# Patient Record
Sex: Male | Born: 1970
Health system: Southern US, Community
[De-identification: ages and names within clinical notes are randomized; demographics above are authoritative.]

## PROBLEM LIST (undated history)

## (undated) DIAGNOSIS — F419 Anxiety disorder, unspecified: Secondary | ICD-10-CM

## (undated) DIAGNOSIS — I639 Cerebral infarction, unspecified: Secondary | ICD-10-CM

## (undated) DIAGNOSIS — I10 Essential (primary) hypertension: Secondary | ICD-10-CM

## (undated) DIAGNOSIS — E7800 Pure hypercholesterolemia, unspecified: Secondary | ICD-10-CM

## (undated) DIAGNOSIS — M109 Gout, unspecified: Secondary | ICD-10-CM

## (undated) HISTORY — DX: Pure hypercholesterolemia, unspecified: E78.00

## (undated) HISTORY — PX: NO PAST SURGERIES: SHX2092

## (undated) HISTORY — DX: Cerebral infarction, unspecified: I63.9

---

## 1997-09-25 ENCOUNTER — Emergency Department (HOSPITAL_COMMUNITY): Admission: EM | Admit: 1997-09-25 | Discharge: 1997-09-25 | Payer: Self-pay | Admitting: Emergency Medicine

## 2014-10-25 ENCOUNTER — Other Ambulatory Visit: Payer: Self-pay | Admitting: Internal Medicine

## 2014-10-25 DIAGNOSIS — R5383 Other fatigue: Secondary | ICD-10-CM

## 2014-10-25 DIAGNOSIS — R799 Abnormal finding of blood chemistry, unspecified: Secondary | ICD-10-CM

## 2014-10-30 ENCOUNTER — Other Ambulatory Visit: Payer: Self-pay

## 2014-11-05 ENCOUNTER — Ambulatory Visit
Admission: RE | Admit: 2014-11-05 | Discharge: 2014-11-05 | Disposition: A | Discharge status: Home / Self Care | Payer: BLUE CROSS/BLUE SHIELD | Source: Ambulatory Visit | Attending: Internal Medicine | Admitting: Internal Medicine

## 2014-11-05 DIAGNOSIS — R5383 Other fatigue: Secondary | ICD-10-CM

## 2014-11-05 DIAGNOSIS — R799 Abnormal finding of blood chemistry, unspecified: Secondary | ICD-10-CM

## 2015-04-26 ENCOUNTER — Emergency Department (HOSPITAL_COMMUNITY)
Admission: EM | Admit: 2015-04-26 | Discharge: 2015-04-26 | Disposition: A | Discharge status: Home / Self Care | Payer: BLUE CROSS/BLUE SHIELD | Attending: Emergency Medicine | Admitting: Emergency Medicine

## 2015-04-26 ENCOUNTER — Encounter (HOSPITAL_COMMUNITY): Payer: Self-pay | Admitting: *Deleted

## 2015-04-26 DIAGNOSIS — T368X5A Adverse effect of other systemic antibiotics, initial encounter: Secondary | ICD-10-CM | POA: Diagnosis not present

## 2015-04-26 DIAGNOSIS — J392 Other diseases of pharynx: Secondary | ICD-10-CM | POA: Diagnosis not present

## 2015-04-26 DIAGNOSIS — Z79899 Other long term (current) drug therapy: Secondary | ICD-10-CM | POA: Diagnosis not present

## 2015-04-26 DIAGNOSIS — I10 Essential (primary) hypertension: Secondary | ICD-10-CM | POA: Insufficient documentation

## 2015-04-26 DIAGNOSIS — T7840XA Allergy, unspecified, initial encounter: Secondary | ICD-10-CM

## 2015-04-26 DIAGNOSIS — R220 Localized swelling, mass and lump, head: Secondary | ICD-10-CM | POA: Diagnosis not present

## 2015-04-26 DIAGNOSIS — F419 Anxiety disorder, unspecified: Secondary | ICD-10-CM | POA: Diagnosis not present

## 2015-04-26 DIAGNOSIS — L299 Pruritus, unspecified: Secondary | ICD-10-CM | POA: Insufficient documentation

## 2015-04-26 HISTORY — DX: Essential (primary) hypertension: I10

## 2015-04-26 HISTORY — DX: Anxiety disorder, unspecified: F41.9

## 2015-04-26 MED ORDER — SODIUM CHLORIDE 0.9 % IV BOLUS (SEPSIS)
1000.0000 mL | Freq: Once | INTRAVENOUS | Status: AC
  Administered 2015-04-26: 1000 mL via INTRAVENOUS

## 2015-04-26 MED ORDER — FAMOTIDINE IN NACL 20-0.9 MG/50ML-% IV SOLN
20.0000 mg | Freq: Once | INTRAVENOUS | Status: AC
  Administered 2015-04-26: 20 mg via INTRAVENOUS
  Filled 2015-04-26: qty 50

## 2015-04-26 MED ORDER — PREDNISONE 10 MG PO TABS: 40.0000 mg | ORAL_TABLET | Freq: Every day | ORAL | Status: DC

## 2015-04-26 MED ORDER — DIPHENHYDRAMINE HCL 50 MG/ML IJ SOLN
25.0000 mg | Freq: Once | INTRAMUSCULAR | Status: AC
  Administered 2015-04-26: 25 mg via INTRAVENOUS
  Filled 2015-04-26: qty 1

## 2015-04-26 MED ORDER — METHYLPREDNISOLONE SODIUM SUCC 125 MG IJ SOLR
125.0000 mg | Freq: Once | INTRAMUSCULAR | Status: AC
  Administered 2015-04-26: 125 mg via INTRAVENOUS
  Filled 2015-04-26: qty 2

## 2015-04-26 MED ORDER — FAMOTIDINE 20 MG PO TABS: 20.0000 mg | ORAL_TABLET | Freq: Two times a day (BID) | ORAL | Status: DC

## 2015-04-26 NOTE — ED Notes (Signed)
Bed: [facility]3 Expected date:  Expected time:  Means of arrival:  Comments: Allergic rxn

## 2015-04-26 NOTE — ED Notes (Signed)
Patient states that he does feel some tightness in his throat area.  Patient denies any respiratory issues.

## 2015-04-26 NOTE — ED Notes (Signed)
Patient states that he is feeling better but dose continue to have notable swelling around the eyes.

## 2015-04-26 NOTE — Discharge Instructions (Signed)
You are allergic to Levaquin.  Do not take this antibiotic in the future.  Contact your doctor.  Get rechecked immediately if you have new or worsening symptoms.  You can take benadryl, available over the counter as needed for itching.    Drug Allergy Allergic reactions to medicines are common. Some allergic reactions are mild. A delayed type of drug allergy that occurs 1 week or more after exposure to a medicine or vaccine is called serum sickness. A life-threatening, sudden (acute) allergic reaction that involves the whole body is called anaphylaxis. CAUSES  "True" drug allergies occur when there is an allergic reaction to a medicine. This is caused by overactivity of the immune system. First, the body becomes sensitized. The immune system is triggered by your first exposure to the medicine. Following this first exposure, future exposure to the same medicine may be life-threatening. Almost any medicine can cause an allergic reaction. Common ones are:  Penicillin.  Sulfonamides (sulfa drugs).  Local anesthetics.  X-ray dyes that contain iodine. SYMPTOMS  Common symptoms of a minor allergic reaction are:  Swelling around the mouth.  An itchy red rash or hives.  Vomiting or diarrhea. Anaphylaxis can cause swelling of the mouth and throat. This makes it difficult to breathe and swallow. Severe reactions can be fatal within seconds, even after exposure to only a trace amount of the drug that causes the reaction. HOME CARE INSTRUCTIONS  If you are unsure of what caused your reaction, write down:  The names of the medicines you took.  How much medicine you took.  How you took the medicine, such as whether you took a pill, injected the medicine, or applied it to your skin.  All of the things you ate and drank.  The date and time of your reaction.  The symptoms of the reaction.  You may want to follow up with an allergy specialist after the reaction has cleared in order to be tested  to confirm the allergy. It is important to confirm that your reaction is an allergy, not just a side effect to the medicine. If you have a true allergy to a medicine, this may prevent that medicine and related medicines from being given to you when you are very ill.  If you have hives or a rash:  Take medicines as directed by your caregiver.  You may use an over-the-counter antihistamine (diphenhydramine) as needed.  Apply cold compresses to the skin or take baths in cool water. Avoid hot baths or showers.  If you are severely allergic:  Continuous observation after a severe reaction may be needed. Hospitalization is often required.  Wear a medical alert bracelet or necklace stating your allergy.  You and your family must learn how to use an anaphylaxis kit or give an epinephrine injection to temporarily treat an emergency allergic reaction. If you have had a severe reaction, always carry your epinephrine injection or anaphylaxis kit with you. This can be lifesaving if you have a severe reaction.  Do not drive or perform tasks after treatment until the medicines used to treat your reaction have worn off, or until your caregiver says it is okay.  If you have a drug allergy that was confirmed by your health care provider:  Carry information about the drug allergy with you at all times.  Always check with a pharmacist before taking any over-the-counter medicine. SEEK MEDICAL CARE IF:   You think you had an allergic reaction. Symptoms usually start within 30 minutes after exposure.  Symptoms are getting worse rather than better.  You develop new symptoms.  The symptoms that brought you to your caregiver return. SEEK IMMEDIATE MEDICAL CARE IF:   You have swelling of the mouth, difficulty breathing, or wheezing.  You have a tight feeling in your chest or throat.  You develop hives, swelling, or itching all over your body.  You develop severe vomiting or diarrhea.  You feel faint  or pass out. This is an emergency. Use your epinephrine injection or anaphylaxis kit as you have been instructed. Call for emergency medical help. Even if you improve after the injection, you need to be examined at a hospital emergency department. MAKE SURE YOU:   Understand these instructions.  Will watch your condition.  Will get help right away if you are not doing well or get worse.   This information is not intended to replace advice given to you by your health care provider. Make sure you discuss any questions you have with your health care provider.   Document Released: 03/02/2005 Document Revised: 03/23/2014 Document Reviewed: 10/02/2014 Elsevier Interactive Patient Education [employer].

## 2015-04-26 NOTE — ED Notes (Addendum)
Pt reports allergic reaction to levaquin. Has taken it before without issue. Took med PTA and began having eye itching and swelling within 5 min. Feels like it is hard to swallow and like his throat is closing. 100% O2 on RA. Has never had an allergic reaction. Pt very anxious. Noticeable swelling to both eyes. Denies itching now. Sts face just feels "tight."

## 2015-04-26 NOTE — ED Provider Notes (Signed)
CSN: [id number]     Arrival date & time 04/26/15  1128 History   First MD Initiated Contact with Patient 04/26/15 1201     Chief Complaint  Patient presents with  . Allergic Reaction     Patient is a 45 y.o. male presenting with allergic reaction. The history is provided by the patient. No language interpreter was used.  Allergic Reaction  Jack [name] is a 45 y.o. male who presents to the Emergency Department complaining of allergic reaction.  Jack [name] was feeling ill with a cold for the last week with chest congestion and nasal congestion. He was treated with a Z-Pak by urgent care Symptoms and then his symptoms began to recur so today he took a Levaquin that he had at home that expired 2-3 years ago. He took it about one hour prior to ED arrival. Shortly after taking the medication he felt swelling and itching to his face and tightness in his throat and is throat felt like it was closing. He had associated shortness of breath. He drove down to the emergency department from [place]. He states that it feels like his throat is improving and does not feel like it's closing anymore. His eyes continue to feel swollen. His itching has improved. He denies any rash, fever, abdominal pain, nausea, vomiting.  Past Medical History  Diagnosis Date  . Hypertension   . Anxiety    History reviewed. No pertinent past surgical history. No family history on file. Social History  Substance Use Topics  . Smoking status: Never Smoker   . Smokeless tobacco: None  . Alcohol Use: No    Review of Systems  All other systems reviewed and are negative.     Allergies  Levaquin  Home Medications   Prior to Admission medications   Medication Sig Start Date End Date Taking? Authorizing Provider  allopurinol (ZYLOPRIM) 100 MG tablet Take 200 mg by mouth daily. 04/06/15  Yes Historical Provider, MD  cloNIDine (CATAPRES) 0.1 MG tablet Take 0.1 mg by mouth 2 (two) times daily. 03/14/15  Yes  Historical Provider, MD  COLCRYS 0.6 MG tablet Take 0.6 mg by mouth 2 (two) times daily. 04/08/15  Yes Historical Provider, MD  ibuprofen (ADVIL,MOTRIN) 200 MG tablet Take 400 mg by mouth every 6 (six) hours as needed for headache, mild pain or moderate pain.   Yes Historical Provider, MD  levofloxacin (LEVAQUIN) 500 MG tablet Take 500 mg by mouth once.   Yes Historical Provider, MD  famotidine (PEPCID) 20 MG tablet Take 1 tablet (20 mg total) by mouth 2 (two) times daily. 04/26/15   [name], MD  predniSONE (DELTASONE) 10 MG tablet Take 4 tablets (40 mg total) by mouth daily. 04/26/15   [name], MD   BP 156/107 mmHg  Pulse 83  Temp(Src) 97.4 F (36.3 C) (Oral)  Resp 18  SpO2 97% Physical Exam  Constitutional: He is oriented to person, place, and time. He appears well-developed and well-nourished.  HENT:  Head: Normocephalic and atraumatic.  Moderate periorbital edema bilaterally. No significant oropharyngeal swelling  Cardiovascular: Normal rate and regular rhythm.   No murmur heard. Pulmonary/Chest: Effort normal and breath sounds normal. No stridor. No respiratory distress.  Abdominal: Soft. There is no tenderness. There is no rebound and no guarding.  Musculoskeletal: He exhibits no edema or tenderness.  Neurological: He is alert and oriented to person, place, and time.  Skin: Skin is warm and dry.  Psychiatric: He has a normal mood and affect.  His behavior is normal.  Nursing note and vitals reviewed.   ED Course  Procedures (including critical care time) Labs Review Labs Reviewed - No data to display  Imaging Review No results found. I have personally reviewed and evaluated these images and lab results as part of my medical decision-making.   EKG Interpretation None      MDM   Final diagnoses:  Allergic reaction, initial encounter    Patient here for evaluation of facial and throat swelling following taking an antibiotic. Patient with considerable  facial swelling on examination but no respiratory distress. He did have throat swelling prior to arrival but he states this is resolving. Treated with steroids, Pepcid, Benadryl. Patient observed in the emergency department with no recurrent symptoms. His facial swelling has improved on repeat evaluation and he states he is asymptomatic this time. Discussed with patient's home care for allergic reaction with Benadryl over-the-counter. Provided prescriptions for steroids and Pepcid. Discussed close return precautions. In terms of his recent URI that he said is take Levaquin for her, do not feel this is indicated and he has no evidence of acute infectious process at this time.    [name], MD 04/26/15 [phone]

## 2015-04-26 NOTE — ED Notes (Signed)
Patient is alert and oriented x3.  He was given DC instructions and follow up visit instructions.  Patient gave verbal understanding.  He was DC ambulatory under his own power to home.  V/S stable.  He was not showing any signs of distress on DC 

## 2015-06-18 DIAGNOSIS — I10 Essential (primary) hypertension: Secondary | ICD-10-CM | POA: Diagnosis not present

## 2015-07-25 DIAGNOSIS — M2550 Pain in unspecified joint: Secondary | ICD-10-CM | POA: Diagnosis not present

## 2015-07-25 DIAGNOSIS — R7989 Other specified abnormal findings of blood chemistry: Secondary | ICD-10-CM | POA: Diagnosis not present

## 2015-07-25 DIAGNOSIS — M1009 Idiopathic gout, multiple sites: Secondary | ICD-10-CM | POA: Diagnosis not present

## 2015-07-25 DIAGNOSIS — K760 Fatty (change of) liver, not elsewhere classified: Secondary | ICD-10-CM | POA: Diagnosis not present

## 2015-08-28 DIAGNOSIS — Z79899 Other long term (current) drug therapy: Secondary | ICD-10-CM | POA: Diagnosis not present

## 2015-08-28 DIAGNOSIS — M1009 Idiopathic gout, multiple sites: Secondary | ICD-10-CM | POA: Diagnosis not present

## 2016-01-24 DIAGNOSIS — M1009 Idiopathic gout, multiple sites: Secondary | ICD-10-CM | POA: Diagnosis not present

## 2016-01-24 DIAGNOSIS — Z79899 Other long term (current) drug therapy: Secondary | ICD-10-CM | POA: Diagnosis not present

## 2016-01-24 DIAGNOSIS — M2550 Pain in unspecified joint: Secondary | ICD-10-CM | POA: Diagnosis not present

## 2016-07-02 ENCOUNTER — Encounter (HOSPITAL_COMMUNITY): Payer: BLUE CROSS/BLUE SHIELD

## 2016-07-02 ENCOUNTER — Emergency Department (HOSPITAL_COMMUNITY): Payer: BLUE CROSS/BLUE SHIELD

## 2016-07-02 ENCOUNTER — Encounter (HOSPITAL_COMMUNITY): Payer: Self-pay | Admitting: *Deleted

## 2016-07-02 ENCOUNTER — Inpatient Hospital Stay (HOSPITAL_COMMUNITY)
Admission: EM | Admit: 2016-07-02 | Discharge: 2016-07-04 | DRG: 062 | Disposition: A | Discharge status: Home Health | Payer: BLUE CROSS/BLUE SHIELD | Attending: Neurology | Admitting: Neurology

## 2016-07-02 ENCOUNTER — Observation Stay (HOSPITAL_COMMUNITY): Payer: BLUE CROSS/BLUE SHIELD

## 2016-07-02 ENCOUNTER — Inpatient Hospital Stay (HOSPITAL_COMMUNITY): Payer: BLUE CROSS/BLUE SHIELD

## 2016-07-02 DIAGNOSIS — R29706 NIHSS score 6: Secondary | ICD-10-CM | POA: Diagnosis not present

## 2016-07-02 DIAGNOSIS — M1A9XX0 Chronic gout, unspecified, without tophus (tophi): Secondary | ICD-10-CM | POA: Diagnosis not present

## 2016-07-02 DIAGNOSIS — R29703 NIHSS score 3: Secondary | ICD-10-CM | POA: Diagnosis present

## 2016-07-02 DIAGNOSIS — R7989 Other specified abnormal findings of blood chemistry: Secondary | ICD-10-CM | POA: Diagnosis present

## 2016-07-02 DIAGNOSIS — E785 Hyperlipidemia, unspecified: Secondary | ICD-10-CM | POA: Diagnosis present

## 2016-07-02 DIAGNOSIS — G8191 Hemiplegia, unspecified affecting right dominant side: Secondary | ICD-10-CM | POA: Diagnosis present

## 2016-07-02 DIAGNOSIS — Z791 Long term (current) use of non-steroidal anti-inflammatories (NSAID): Secondary | ICD-10-CM | POA: Diagnosis not present

## 2016-07-02 DIAGNOSIS — R4781 Slurred speech: Secondary | ICD-10-CM | POA: Diagnosis present

## 2016-07-02 DIAGNOSIS — E663 Overweight: Secondary | ICD-10-CM | POA: Diagnosis not present

## 2016-07-02 DIAGNOSIS — J984 Other disorders of lung: Secondary | ICD-10-CM | POA: Diagnosis not present

## 2016-07-02 DIAGNOSIS — R471 Dysarthria and anarthria: Secondary | ICD-10-CM | POA: Diagnosis present

## 2016-07-02 DIAGNOSIS — Z6829 Body mass index (BMI) 29.0-29.9, adult: Secondary | ICD-10-CM | POA: Diagnosis not present

## 2016-07-02 DIAGNOSIS — I161 Hypertensive emergency: Secondary | ICD-10-CM | POA: Diagnosis not present

## 2016-07-02 DIAGNOSIS — M109 Gout, unspecified: Secondary | ICD-10-CM | POA: Diagnosis present

## 2016-07-02 DIAGNOSIS — G458 Other transient cerebral ischemic attacks and related syndromes: Secondary | ICD-10-CM | POA: Diagnosis not present

## 2016-07-02 DIAGNOSIS — Z79899 Other long term (current) drug therapy: Secondary | ICD-10-CM | POA: Diagnosis not present

## 2016-07-02 DIAGNOSIS — F4001 Agoraphobia with panic disorder: Secondary | ICD-10-CM

## 2016-07-02 DIAGNOSIS — F419 Anxiety disorder, unspecified: Secondary | ICD-10-CM

## 2016-07-02 DIAGNOSIS — R000 Tachycardia, unspecified: Secondary | ICD-10-CM | POA: Diagnosis not present

## 2016-07-02 DIAGNOSIS — R269 Unspecified abnormalities of gait and mobility: Secondary | ICD-10-CM | POA: Diagnosis not present

## 2016-07-02 DIAGNOSIS — G459 Transient cerebral ischemic attack, unspecified: Secondary | ICD-10-CM | POA: Diagnosis not present

## 2016-07-02 DIAGNOSIS — I10 Essential (primary) hypertension: Secondary | ICD-10-CM | POA: Diagnosis not present

## 2016-07-02 DIAGNOSIS — H538 Other visual disturbances: Secondary | ICD-10-CM | POA: Diagnosis not present

## 2016-07-02 DIAGNOSIS — I63512 Cerebral infarction due to unspecified occlusion or stenosis of left middle cerebral artery: Secondary | ICD-10-CM | POA: Diagnosis not present

## 2016-07-02 DIAGNOSIS — R21 Rash and other nonspecific skin eruption: Secondary | ICD-10-CM | POA: Diagnosis not present

## 2016-07-02 DIAGNOSIS — R29810 Facial weakness: Secondary | ICD-10-CM

## 2016-07-02 DIAGNOSIS — D72829 Elevated white blood cell count, unspecified: Secondary | ICD-10-CM | POA: Diagnosis not present

## 2016-07-02 DIAGNOSIS — I160 Hypertensive urgency: Secondary | ICD-10-CM | POA: Diagnosis not present

## 2016-07-02 DIAGNOSIS — R531 Weakness: Secondary | ICD-10-CM | POA: Diagnosis not present

## 2016-07-02 DIAGNOSIS — R29704 NIHSS score 4: Secondary | ICD-10-CM | POA: Diagnosis not present

## 2016-07-02 DIAGNOSIS — I63312 Cerebral infarction due to thrombosis of left middle cerebral artery: Secondary | ICD-10-CM | POA: Diagnosis not present

## 2016-07-02 DIAGNOSIS — J9811 Atelectasis: Secondary | ICD-10-CM | POA: Diagnosis not present

## 2016-07-02 DIAGNOSIS — J439 Emphysema, unspecified: Secondary | ICD-10-CM | POA: Diagnosis present

## 2016-07-02 DIAGNOSIS — Z881 Allergy status to other antibiotic agents status: Secondary | ICD-10-CM | POA: Diagnosis not present

## 2016-07-02 DIAGNOSIS — I639 Cerebral infarction, unspecified: Secondary | ICD-10-CM | POA: Diagnosis not present

## 2016-07-02 DIAGNOSIS — R404 Transient alteration of awareness: Secondary | ICD-10-CM | POA: Diagnosis not present

## 2016-07-02 DIAGNOSIS — R51 Headache: Secondary | ICD-10-CM | POA: Diagnosis not present

## 2016-07-02 HISTORY — DX: Gout, unspecified: M10.9

## 2016-07-02 LAB — COMPREHENSIVE METABOLIC PANEL
ALBUMIN: 4.1 g/dL (ref 3.5–5.0)
ALK PHOS: 66 U/L (ref 38–126)
ALT: 95 U/L — ABNORMAL HIGH (ref 17–63)
ALT: 102 U/L — AB (ref 17–63)
ANION GAP: 14 (ref 5–15)
AST: 70 U/L — ABNORMAL HIGH (ref 15–41)
AST: 70 U/L — AB (ref 15–41)
Albumin: 4.3 g/dL (ref 3.5–5.0)
Alkaline Phosphatase: 67 U/L (ref 38–126)
Anion gap: 11 (ref 5–15)
BILIRUBIN TOTAL: 0.6 mg/dL (ref 0.3–1.2)
BILIRUBIN TOTAL: 1 mg/dL (ref 0.3–1.2)
BUN: 17 mg/dL (ref 6–20)
BUN: 17 mg/dL (ref 6–20)
CALCIUM: 9.7 mg/dL (ref 8.9–10.3)
CO2: 21 mmol/L — AB (ref 22–32)
CO2: 25 mmol/L (ref 22–32)
CREATININE: 1.03 mg/dL (ref 0.61–1.24)
Calcium: 9.6 mg/dL (ref 8.9–10.3)
Chloride: 103 mmol/L (ref 101–111)
Chloride: 103 mmol/L (ref 101–111)
Creatinine, Ser: 1.2 mg/dL (ref 0.61–1.24)
GFR calc Af Amer: >60 mL/min (ref >60)
GFR calc Af Amer: >60 mL/min (ref >60)
GFR calc non Af Amer: >60 mL/min (ref >60)
GFR calc non Af Amer: >60 mL/min (ref >60)
GLUCOSE: 122 mg/dL — AB (ref 65–99)
Glucose, Bld: 158 mg/dL — ABNORMAL HIGH (ref 65–99)
POTASSIUM: 3.9 mmol/L (ref 3.5–5.1)
POTASSIUM: 3.8 mmol/L (ref 3.5–5.1)
SODIUM: 138 mmol/L (ref 135–145)
SODIUM: 139 mmol/L (ref 135–145)
Total Protein: 7 g/dL (ref 6.5–8.1)
Total Protein: 7.5 g/dL (ref 6.5–8.1)

## 2016-07-02 LAB — PROTIME-INR
INR: 0.95
Prothrombin Time: 12.7 seconds (ref 11.4–15.2)

## 2016-07-02 LAB — APTT: APTT: 30 s (ref 24–36)

## 2016-07-02 LAB — I-STAT CHEM 8, ED
BUN: 19 mg/dL (ref 6–20)
CHLORIDE: 106 mmol/L (ref 101–111)
CREATININE: 1.1 mg/dL (ref 0.61–1.24)
Calcium, Ion: 1.02 mmol/L — ABNORMAL LOW (ref 1.15–1.40)
Glucose, Bld: 124 mg/dL — ABNORMAL HIGH (ref 65–99)
HEMATOCRIT: 44 % (ref 39.0–52.0)
Hemoglobin: 15 g/dL (ref 13.0–17.0)
POTASSIUM: 3.7 mmol/L (ref 3.5–5.1)
SODIUM: 139 mmol/L (ref 135–145)
TCO2: 23 mmol/L (ref 0–100)

## 2016-07-02 LAB — CBC
HCT: 43.8 % (ref 39.0–52.0)
HCT: 47.1 % (ref 39.0–52.0)
HEMOGLOBIN: 15.3 g/dL (ref 13.0–17.0)
HEMOGLOBIN: 16.2 g/dL (ref 13.0–17.0)
MCH: 31 pg (ref 26.0–34.0)
MCH: 30.6 pg (ref 26.0–34.0)
MCHC: 34.9 g/dL (ref 30.0–36.0)
MCHC: 34.4 g/dL (ref 30.0–36.0)
MCV: 88.7 fL (ref 78.0–100.0)
MCV: 88.9 fL (ref 78.0–100.0)
PLATELETS: 231 10*3/uL (ref 150–400)
PLATELETS: 177 10*3/uL (ref 150–400)
RBC: 4.94 MIL/uL (ref 4.22–5.81)
RBC: 5.3 MIL/uL (ref 4.22–5.81)
RDW: 13.2 % (ref 11.5–15.5)
RDW: 13.3 % (ref 11.5–15.5)
WBC: 8.6 10*3/uL (ref 4.0–10.5)
WBC: 11.3 10*3/uL — AB (ref 4.0–10.5)

## 2016-07-02 LAB — MRSA PCR SCREENING: MRSA BY PCR: NEGATIVE

## 2016-07-02 LAB — RAPID URINE DRUG SCREEN, HOSP PERFORMED
AMPHETAMINES: NOT DETECTED
BARBITURATES: NOT DETECTED
Benzodiazepines: NOT DETECTED
Cocaine: NOT DETECTED
Opiates: NOT DETECTED
Tetrahydrocannabinol: NOT DETECTED

## 2016-07-02 LAB — URINALYSIS, ROUTINE W REFLEX MICROSCOPIC
BILIRUBIN URINE: NEGATIVE
Glucose, UA: NEGATIVE mg/dL
HGB URINE DIPSTICK: NEGATIVE
Ketones, ur: NEGATIVE mg/dL
Leukocytes, UA: NEGATIVE
Nitrite: NEGATIVE
PROTEIN: NEGATIVE mg/dL
SPECIFIC GRAVITY, URINE: 1.004 — AB (ref 1.005–1.030)
pH: 7 (ref 5.0–8.0)

## 2016-07-02 LAB — HIV ANTIBODY (ROUTINE TESTING W REFLEX): HIV Screen 4th Generation wRfx: NONREACTIVE

## 2016-07-02 LAB — GLUCOSE, CAPILLARY
Glucose-Capillary: 105 mg/dL — ABNORMAL HIGH (ref 65–99)
Glucose-Capillary: 106 mg/dL — ABNORMAL HIGH (ref 65–99)

## 2016-07-02 LAB — ECHOCARDIOGRAM COMPLETE
Height: 72 in
Weight: 3452.8 oz

## 2016-07-02 LAB — DIFFERENTIAL
BASOS ABS: 0 10*3/uL (ref 0.0–0.1)
Basophils Relative: 0 %
EOS PCT: 3 %
Eosinophils Absolute: 0.3 10*3/uL (ref 0.0–0.7)
LYMPHS ABS: 4.1 10*3/uL — AB (ref 0.7–4.0)
LYMPHS PCT: 48 %
Monocytes Absolute: 0.7 10*3/uL (ref 0.1–1.0)
Monocytes Relative: 8 %
NEUTROS PCT: 41 %
Neutro Abs: 3.5 10*3/uL (ref 1.7–7.7)

## 2016-07-02 LAB — LIPID PANEL
CHOLESTEROL: 317 mg/dL — AB (ref 0–200)
HDL: 34 mg/dL — ABNORMAL LOW (ref >40)
LDL Cholesterol: 239 mg/dL — ABNORMAL HIGH (ref 0–99)
TRIGLYCERIDES: 219 mg/dL — AB (ref <150)
Total CHOL/HDL Ratio: 9.3 RATIO
VLDL: 44 mg/dL — AB (ref 0–40)

## 2016-07-02 LAB — I-STAT TROPONIN, ED: TROPONIN I, POC: 0 ng/mL (ref 0.00–0.08)

## 2016-07-02 LAB — CBG MONITORING, ED
GLUCOSE-CAPILLARY: 123 mg/dL — AB (ref 65–99)
Glucose-Capillary: 102 mg/dL — ABNORMAL HIGH (ref 65–99)

## 2016-07-02 LAB — ETHANOL: Alcohol, Ethyl (B): <5 mg/dL (ref <5)

## 2016-07-02 MED ORDER — SENNOSIDES-DOCUSATE SODIUM 8.6-50 MG PO TABS: 1.0000 | ORAL_TABLET | Freq: Every evening | ORAL | Status: DC | PRN

## 2016-07-02 MED ORDER — NICARDIPINE HCL IN NACL 20-0.86 MG/200ML-% IV SOLN
0.0000 mg/h | INTRAVENOUS | Status: DC
  Administered 2016-07-02 (×2): 2.5 mg/h via INTRAVENOUS
  Administered 2016-07-02: 5 mg/h via INTRAVENOUS
  Administered 2016-07-03: 3 mg/h via INTRAVENOUS
  Filled 2016-07-02 (×4): qty 200

## 2016-07-02 MED ORDER — SODIUM CHLORIDE 0.9 % IV SOLN: INTRAVENOUS | Status: AC

## 2016-07-02 MED ORDER — SODIUM CHLORIDE 0.9 % IV SOLN
INTRAVENOUS | Status: DC
  Administered 2016-07-02 – 2016-07-03 (×4): via INTRAVENOUS

## 2016-07-02 MED ORDER — STROKE: EARLY STAGES OF RECOVERY BOOK
Freq: Once | Status: AC
  Filled 2016-07-02: qty 1

## 2016-07-02 MED ORDER — STROKE: EARLY STAGES OF RECOVERY BOOK
Freq: Once | Status: AC
  Administered 2016-07-02: 06:00:00
  Filled 2016-07-02: qty 1

## 2016-07-02 MED ORDER — ACETAMINOPHEN 650 MG RE SUPP: 650.0000 mg | RECTAL | Status: DC | PRN

## 2016-07-02 MED ORDER — SODIUM CHLORIDE 0.9 % IV SOLN: 50.0000 mL | Freq: Once | INTRAVENOUS | Status: DC

## 2016-07-02 MED ORDER — PANTOPRAZOLE SODIUM 40 MG IV SOLR
40.0000 mg | Freq: Every day | INTRAVENOUS | Status: DC
  Administered 2016-07-02 – 2016-07-03 (×2): 40 mg via INTRAVENOUS
  Filled 2016-07-02 (×2): qty 40

## 2016-07-02 MED ORDER — IOPAMIDOL (ISOVUE-370) INJECTION 76%
INTRAVENOUS | Status: AC
  Administered 2016-07-02: 50 mL
  Filled 2016-07-02: qty 50

## 2016-07-02 MED ORDER — ACETAMINOPHEN 325 MG PO TABS: 650.0000 mg | ORAL_TABLET | ORAL | Status: DC | PRN

## 2016-07-02 MED ORDER — ACETAMINOPHEN 160 MG/5ML PO SOLN: 650.0000 mg | ORAL | Status: DC | PRN

## 2016-07-02 MED ORDER — NICARDIPINE HCL IN NACL 20-0.86 MG/200ML-% IV SOLN: 5.0000 mg/h | Freq: Once | INTRAVENOUS | Status: DC

## 2016-07-02 MED ORDER — ENOXAPARIN SODIUM 40 MG/0.4ML ~~LOC~~ SOLN
40.0000 mg | SUBCUTANEOUS | Status: DC
  Administered 2016-07-03 – 2016-07-04 (×2): 40 mg via SUBCUTANEOUS
  Filled 2016-07-02 (×2): qty 0.4

## 2016-07-02 MED ORDER — ALTEPLASE (STROKE) FULL DOSE INFUSION
90.0000 mg | Freq: Once | INTRAVENOUS | Status: AC
  Administered 2016-07-02: 90 mg via INTRAVENOUS
  Filled 2016-07-02: qty 100

## 2016-07-02 NOTE — Code Documentation (Signed)
RRT responded to Code stroke called at 0422.  LSN 0400.  Pt deemed candidate for TPA by Dr. Roseanne Reno. Pharmacist notified.  Additional IV access obtained.  Foley attempted without success.  Cardene gtt started prior to tPA admin.  CTA completed.  Assisted with pt transfer to 4N31 for further care.

## 2016-07-02 NOTE — ED Triage Notes (Signed)
The pt was also c/o weakness 13 hours ago

## 2016-07-02 NOTE — Care Management Note (Signed)
Case Management Note  Patient Details  Name: Jack [name] MRN: [id number] Date of Birth: 08-13-1970  Subjective/Objective:  Pt admitted on 07/02/16 s/p stroke s/s, s/p TPA.  PTA, pt independent of ADLS; has supportive mother and significant other.                  Action/Plan: PT/OT evaluations pending.  Will follow for discharge planning as pt progresses.    Expected Discharge Date:                  Expected Discharge Plan:  IP Rehab Facility  In-House Referral:     Discharge planning Services  CM Consult  Post Acute Care Choice:    Choice offered to:     DME Arranged:    DME Agency:     HH Arranged:    HH Agency:     Status of Service:  In process, will continue to follow  If discussed at Long Length of Stay Meetings, dates discussed:    Additional Comments:  Quintella Baton, RN, BSN  Trauma/Neuro ICU Case Manager [phone]

## 2016-07-02 NOTE — ED Notes (Signed)
Gunnar Fusi called this RN to reactivate Code Stroke.  Neuro to reassess.

## 2016-07-02 NOTE — ED Provider Notes (Signed)
[facility]EM[facility]GENCY DEPT Provider Note   CSN: [id number] Arrival date & time: 07/02/16  4098   By signing my name below, I, Jack [name], attest that this documentation has been prepared under the direction and in the presence of Jack Crumble, MD  Electronically Signed: Clovis [name], ED Scribe. 07/02/16. 12:55 AM.  History   Chief Complaint Chief Complaint  Patient presents with  . Headache    HPI Comments:  Jack [name] is a 46 y.o. male, with a PMHx of HTN, who presents to the Emergency Department complaining of acute onset, waxing and waning right sided weakness which began 2 hours PTA. Pt also reports a gradually improving speech difficulty. Pt states he went to be evaluated at [facility] a few hours ago but notes he went home since his symptoms subsided. He notes his symptoms returned 1 hour PTA. EMS personnel reports the pt began to experience generalized weakness and blurry vision 14 hours PTA. EMS states the pt had slurred speech, right facial droop, right arm drift and a gait issue x 2 hours. The pt was given 20 mg of Labetalol en route to the ED. The pt's blood pressure went from 252/140 to 180/120 PTA. No other complaints noted at this time.   The history is provided by the patient and the EMS personnel. No language interpreter was used.    Past Medical History:  Diagnosis Date  . Anxiety   . Hypertension     There are no active problems to display for this patient.   History reviewed. No pertinent surgical history.     Home Medications    Prior to Admission medications   Medication Sig Start Date End Date Taking? Authorizing Provider  cloNIDine (CATAPRES) 0.1 MG tablet Take 0.1 mg by mouth 2 (two) times daily. 03/14/15  Yes Historical Provider, MD  ibuprofen (ADVIL,MOTRIN) 200 MG tablet Take 400 mg by mouth every 6 (six) hours as needed for headache, mild pain or moderate pain.   Yes Historical Provider, MD  famotidine (PEPCID) 20 MG tablet Take 1 tablet  (20 mg total) by mouth 2 (two) times daily. Patient not taking: Reported on 07/02/2016 04/26/15   [name], MD  predniSONE (DELTASONE) 10 MG tablet Take 4 tablets (40 mg total) by mouth daily. Patient not taking: Reported on 07/02/2016 04/26/15   [name], MD    Family History No family history on file.  Social History Social History  Substance Use Topics  . Smoking status: Never Smoker  . Smokeless tobacco: Never Used  . Alcohol use No     Allergies   Levaquin [levofloxacin in d5w]   Review of Systems Review of Systems All other systems reviewed and are negative for acute change except as noted in the HPI.  Physical Exam Updated Vital Signs BP (!) 158/111   Pulse 82   Temp 98.6 F (37 C) (Oral)   Resp (!) 21   Ht 6' (1.829 m)   Wt 220 lb (99.8 kg)   SpO2 100%   BMI 29.84 kg/m   Physical Exam  Constitutional: He is oriented to person, place, and time. Vital signs are normal. He appears well-developed and well-nourished.  Non-toxic appearance. He does not appear ill. No distress.  HENT:  Head: Normocephalic and atraumatic.  Nose: Nose normal.  Mouth/Throat: Oropharynx is clear and moist. No oropharyngeal exudate.  Eyes: Conjunctivae and EOM are normal. Pupils are equal, round, and reactive to light. No scleral icterus.  Neck: Normal range of motion.  Neck supple. No tracheal deviation, no edema, no erythema and normal range of motion present. No thyroid mass and no thyromegaly present.  Cardiovascular: Normal rate, regular rhythm, S1 normal, S2 normal, normal heart sounds, intact distal pulses and normal pulses.  Exam reveals no gallop and no friction rub.   No murmur heard. Pulmonary/Chest: Effort normal and breath sounds normal. No respiratory distress. He has no wheezes. He has no rhonchi. He has no rales.  Abdominal: Soft. Normal appearance and bowel sounds are normal. He exhibits no distension, no ascites and no mass. There is no hepatosplenomegaly. There  is no tenderness. There is no rebound, no guarding and no CVA tenderness.  Musculoskeletal: Normal range of motion. He exhibits no edema or tenderness.  Lymphadenopathy:    He has no cervical adenopathy.  Neurological: He is alert and oriented to person, place, and time. He has normal strength. No cranial nerve deficit or sensory deficit.  3/5 strength in right upper and lower extremity with right sided pronator drift.  Skin: Skin is warm, dry and intact. No petechiae and no rash noted. He is not diaphoretic. No erythema. No pallor.  Nursing note and vitals reviewed.    ED Treatments / Results  DIAGNOSTIC STUDIES:  Oxygen Saturation is 96% on RA, normal by my interpretation.    COORDINATION OF CARE:  12:49 AM Discussed treatment plan with pt at bedside and pt agreed to plan.  Labs (all labs ordered are listed, but only abnormal results are displayed) Labs Reviewed  DIFFERENTIAL - Abnormal; Notable for the following:       Result Value   Lymphs Abs 4.1 (*)    All other components within normal limits  COMPREHENSIVE METABOLIC PANEL - Abnormal; Notable for the following:    CO2 21 (*)    Glucose, Bld 122 (*)    AST 70 (*)    ALT 95 (*)    All other components within normal limits  I-STAT CHEM 8, ED - Abnormal; Notable for the following:    Glucose, Bld 124 (*)    Calcium, Ion 1.02 (*)    All other components within normal limits  CBG MONITORING, ED - Abnormal; Notable for the following:    Glucose-Capillary 123 (*)    All other components within normal limits  ETHANOL  PROTIME-INR  APTT  CBC  RAPID URINE DRUG SCREEN, HOSP PERFORMED  URINALYSIS, ROUTINE W REFLEX MICROSCOPIC  I-STAT TROPOININ, ED    EKG  EKG Interpretation  Date/Time:  Thursday July 02 2016 00:48:51 EDT Ventricular Rate:  83 PR Interval:    QRS Duration: 86 QT Interval:  385 QTC Calculation: 453 R Axis:   21 Text Interpretation:  Normal sinus rhythm No old tracing to compare Confirmed by Erroll Luna [phone]) on 07/02/2016 12:54:43 AM       Radiology Ct Head Code Stroke W/o Cm  Result Date: 07/02/2016 CLINICAL DATA:  Code stroke. Initial evaluation for acute right-sided weakness. EXAM: CT HEAD WITHOUT CONTRAST TECHNIQUE: Contiguous axial images were obtained from the base of the skull through the vertex without intravenous contrast. COMPARISON:  None. FINDINGS: Brain: Cerebral volume normal. No acute intracranial hemorrhage. No evidence for acute large vessel territory infarct. No mass lesion, midline shift or mass effect. No hydrocephalus. No extra-axial fluid collection. Vascular: No asymmetric hyperdense vessel. Skull: Scalp soft tissues within normal limits.  Calvarium intact. Sinuses/Orbits: Globes and orbital soft tissues within normal limits. Paranasal sinuses and mastoid air cells are clear. Other: None ASPECTS (  [place] Stroke Program Early CT Score) - Ganglionic level infarction (caudate, lentiform nuclei, internal capsule, insula, M1-M3 cortex): 7 - Supraganglionic infarction (M4-M6 cortex): 3 Total score (0-10 with 10 being normal): 10 IMPRESSION: 1. No acute intracranial infarct or other process identified. 2. ASPECTS is 10 Critical Value/emergent results were called by telephone at the time of interpretation on 07/02/2016 at 1:08 am to Dr. Roseanne Reno , who verbally acknowledged these results. Electronically Signed   By: Rise Mu M.D.   On: 07/02/2016 01:10    Procedures Procedures (including critical care time)  Medications Ordered in ED Medications - No data to display   Initial Impression / Assessment and Plan / ED Course  I have reviewed the triage vital signs and the nursing notes.  Pertinent labs & imaging results that were available during my care of the patient were reviewed by me and considered in my medical decision making (see chart for details).     Patient presents to the ED for stroke like symptoms. He may still be in the window as  symptoms resolved but then returned just 2 hours ago.  Code stroke was initiated.  Head Ct negative. Dr. Roseanne Reno evaluated the patient and agrees this was likely a stroke vs TIA.  Current BP is improved from 250 by EMS to 150s in the ED.  They gave him labetalol.  Will admit to the hospitalist for stroke work up. Dr. Criselda Peaches accepts for further care.    Final Clinical Impressions(s) / ED Diagnoses   Final diagnoses:  Cerebrovascular accident (CVA), unspecified mechanism (HCC)    New Prescriptions New Prescriptions   No medications on file  I personally performed the services described in this documentation, which was scribed in my presence. The recorded information has been reviewed and is accurate.      Jack Crumble, MD 07/02/16 [phone]

## 2016-07-02 NOTE — ED Triage Notes (Signed)
The pt arrived by [place] [facility] from home.  13 hours of headache blurred vision  bp high  Ems gave the pt labedlalol 20 mg iv for bp   Iv per ems

## 2016-07-02 NOTE — ED Notes (Signed)
Pt resolved of all symptoms at this time. Will cont to monitor.

## 2016-07-02 NOTE — Consult Note (Signed)
Admission H&P    Chief Complaint: Intermittent slurring of speech, facial droop and right-sided weakness.  HPI: Jack [name] is an 46 y.o. male history of hypertension and anxiety presenting with complaints of blurred vision as well as slurred speech, intermittent visual droop and right sided weakness. Initial episode of weakness occurred at about 9 PM tonight and was subsequently cleared. He had recurrence of weakness at 11:51 PM, which has improved but not completely resolved. He has not been on antiplatelet therapy area blood pressure was markedly elevated and patient is currently on Cardene drip, having failed dimension with labetalol IV. NIH stroke score was 4.  LSN: 11:51 PM on 07/02/1998 8T tPA Given: No: Deficits rapidly resolving mRankin:  Past Medical History:  Diagnosis Date  . Anxiety   . Hypertension     History reviewed. No pertinent surgical history.  No family history on file. Social History:  reports that he has never smoked. He has never used smokeless tobacco. He reports that he does not drink alcohol or use drugs.  Allergies:  Allergies  Allergen Reactions  . Levaquin [Levofloxacin In D5w] Itching and Swelling    Medications: Preadmission medications were reviewed by me.  ROS: History obtained from the patient  General ROS: negative for - chills, fatigue, fever, night sweats, weight gain or weight loss Psychological ROS: negative for - behavioral disorder, hallucinations, memory difficulties, mood swings or suicidal ideation Ophthalmic ROS: negative for - blurry vision, double vision, eye pain or loss of vision ENT ROS: negative for - epistaxis, nasal discharge, oral lesions, sore throat, tinnitus or vertigo Allergy and Immunology ROS: negative for - hives or itchy/watery eyes Hematological and Lymphatic ROS: negative for - bleeding problems, bruising or swollen lymph nodes Endocrine ROS: negative for - galactorrhea, hair pattern changes,  polydipsia/polyuria or temperature intolerance Respiratory ROS: negative for - cough, hemoptysis, shortness of breath or wheezing Cardiovascular ROS: negative for - chest pain, dyspnea on exertion, edema or irregular heartbeat Gastrointestinal ROS: negative for - abdominal pain, diarrhea, hematemesis, nausea/vomiting or stool incontinence Genito-Urinary ROS: negative for - dysuria, hematuria, incontinence or urinary frequency/urgency Musculoskeletal ROS: negative for - joint swelling or muscular weakness Neurological ROS: as noted in HPI Dermatological ROS: negative for rash and skin lesion changes  Physical Examination: Blood pressure (!) 165/101, pulse 83, temperature 98.6 F (37 C), temperature source Oral, resp. rate (!) 25, height 6' (1.829 m), weight 99.8 kg (220 lb), SpO2 95 %.  HEENT-  Normocephalic, no lesions, without obvious abnormality.  Normal external eye and conjunctiva.  Normal TM's bilaterally.  Normal auditory canals and external ears. Normal external nose, mucus membranes and septum.  Normal pharynx. Neck supple with no masses, nodes, nodules or enlargement. Cardiovascular - regular rate and rhythm, S1, S2 normal, no murmur, click, rub or gallop Lungs - chest clear, no wheezing, rales, normal symmetric air entry Abdomen - soft, non-tender; bowel sounds normal; no masses,  no organomegaly Extremities - no joint deformities, effusion, or inflammation  Neurologic Examination: Mental Status: Alert, oriented, no acute distress.  Speech slightly dysarthric without evidence of aphasia. Able to follow commands without difficulty. Cranial Nerves: II-Visual fields were normal. III/IV/VI-Pupils were equal and reacted normally to light. Extraocular movements were full and conjugate.    V/VII-no facial numbness and no facial weakness. VIII-normal. X-slight dysarthria. XI: trapezius strength/neck flexion strength normal bilaterally XII-midline tongue extension with normal  strength. Motor: Mild drift of right extremities, upper greater than lower; motor exam otherwise unremarkable Sensory: Normal throughout. Deep  Tendon Reflexes: 1+ and symmetric. Plantars: Flexor bilaterally Cerebellar: Normal finger-to-nose testing. Carotid auscultation: Normal  Results for orders placed or performed during the hospital encounter of 07/02/16 (from the past 48 hour(s))  CBG monitoring, ED     Status: Abnormal   Collection Time: 07/02/16 12:48 AM  Result Value Ref Range   Glucose-Capillary 123 (H) 65 - 99 mg/dL   Comment 1 Notify RN    Comment 2 Document in Chart   I-stat troponin, ED (not at [facility], [facility])     Status: None   Collection Time: 07/02/16  1:10 AM  Result Value Ref Range   Troponin i, poc 0.00 0.00 - 0.08 ng/mL   Comment 3            Comment: Due to the release kinetics of cTnI, a negative result within the first hours of the onset of symptoms does not rule out myocardial infarction with certainty. If myocardial infarction is still suspected, repeat the test at appropriate intervals.   I-Stat Chem 8, ED  (not at [facility], [facility])     Status: Abnormal   Collection Time: 07/02/16  1:12 AM  Result Value Ref Range   Sodium 139 135 - 145 mmol/L   Potassium 3.7 3.5 - 5.1 mmol/L   Chloride 106 101 - 111 mmol/L   BUN 19 6 - 20 mg/dL   Creatinine, Ser 7.42 0.61 - 1.24 mg/dL   Glucose, Bld 595 (H) 65 - 99 mg/dL   Calcium, Ion 6.38 (L) 1.15 - 1.40 mmol/L   TCO2 23 0 - 100 mmol/L   Hemoglobin 15.0 13.0 - 17.0 g/dL   HCT 75.6 43.3 - 29.5 %   Ct Head Code Stroke W/o Cm  Result Date: 07/02/2016 CLINICAL DATA:  Code stroke. Initial evaluation for acute right-sided weakness. EXAM: CT HEAD WITHOUT CONTRAST TECHNIQUE: Contiguous axial images were obtained from the base of the skull through the vertex without intravenous contrast. COMPARISON:  None. FINDINGS: Brain: Cerebral volume normal. No acute intracranial hemorrhage. No evidence for acute large vessel territory infarct.  No mass lesion, midline shift or mass effect. No hydrocephalus. No extra-axial fluid collection. Vascular: No asymmetric hyperdense vessel. Skull: Scalp soft tissues within normal limits.  Calvarium intact. Sinuses/Orbits: Globes and orbital soft tissues within normal limits. Paranasal sinuses and mastoid air cells are clear. Other: None ASPECTS ([place] Stroke Program Early CT Score) - Ganglionic level infarction (caudate, lentiform nuclei, internal capsule, insula, M1-M3 cortex): 7 - Supraganglionic infarction (M4-M6 cortex): 3 Total score (0-10 with 10 being normal): 10 IMPRESSION: 1. No acute intracranial infarct or other process identified. 2. ASPECTS is 10 Critical Value/emergent results were called by telephone at the time of interpretation on 07/02/2016 at 1:08 am to Dr. Roseanne Reno , who verbally acknowledged these results. Electronically Signed   By: Rise Mu M.D.   On: 07/02/2016 01:10    Assessment: 46 y.o. male history of hypertension and tingling with potential urgency as well as verbal left MCA territory subcortical transient ischemic attacks.  Stroke Risk Factors - hypertension  Plan: 1. HgbA1c, fasting lipid panel 2. MRI, MRA  of the brain without contrast 3. PT consult, OT consult, Speech consult 4. Echocardiogram 5. Carotid dopplers 6. Prophylactic therapy-Antiplatelet med: Aspirin  7. Risk factor modification 8. Telemetry monitoring 9. Hypercoagulopathy panel  C.R. Roseanne Reno, MD [facility] [phone]  07/02/2016, 1:23 AM

## 2016-07-02 NOTE — H&P (Signed)
History and Physical    Jack [name] IBB:[id number] DOB: 1970-09-01 DOA: 07/02/2016  PCP: Dr. Lucile Crater Patient coming from: Home    Chief Complaint: vision changes, slurred speech  HPI: Jack [name] is a 46 y.o. male with medical history significant of HTN and anxiety who presents for intermittent neuro symptoms.  He reports that the symptoms have been intermittent and coming and going for a couple of months, but today they got worse and so he presented to the ED.  He reports the symptoms as blurry vision in which he sees a myriad of colors that block his vision.  He thinks this may be one sided, but might be bilateral.  Today he had a headache after the vision changes, but this is not always the case.  He further notes slurring words and right sided weakness which has come and gone.  He notes that today it was worse and he felt that he could not walk due to the right sided weakness.  These symptoms initially started around 11am and then resolved.  They recurred in the evening, but by the time he got from [place] to [place] they had resolved, so he went home.  Unfortunately, when he got home, they recurred again and he called an ambulance.  He reports that his family noted he "sounded like he was drunk."  He now feels back to normal.  He has had a headache, no chest pain.  The headache resolves on its own.  He has been faithfully taking clonidine.  He is on this because other antihypertensives have caused his gout to flare up.  He finally notes a bumpy rash on his wrists and hands for 2 days which does not itch. He has not been traveling, out in the forest or recently bitten by a tick.    ED Course: In the ED, his BP was noted to be elevated and he was due to be placed on a cardene drip, but then his BP resolved to the 450T systolic with IV labetalol alone.  He was evaluated by Neurology.  AST and ALT noted to be mildly elevated. UDS and ETOH level negative.  CT head done showed no  acute stroke.     Review of Systems: As per HPI otherwise 10 point review of systems negative.    Past Medical History:  Diagnosis Date  . Anxiety   . Gout   . Hypertension    He has never had surgery  History reviewed. No pertinent surgical history.   reports that he has never smoked. He has never used smokeless tobacco. He reports that he does not drink alcohol or use drugs.  Allergies  Allergen Reactions  . Levaquin [Levofloxacin In D5w] Itching and Swelling   He does not know FH Family History  Problem Relation Age of Onset  . Adopted: Yes    Prior to Admission medications   Medication Sig Start Date End Date Taking? Authorizing Provider  cloNIDine (CATAPRES) 0.1 MG tablet Take 0.1 mg by mouth 2 (two) times daily. 03/14/15  Yes Historical Provider, MD  ibuprofen (ADVIL,MOTRIN) 200 MG tablet Take 400 mg by mouth every 6 (six) hours as needed for headache, mild pain or moderate pain.   Yes Historical Provider, MD                  Physical Exam: Vitals:   07/02/16 0200 07/02/16 0223 07/02/16 0230 07/02/16 0245  BP: (!) 158/111  (!) 167/117 (!) 153/106  Pulse: 82  81 84  Resp: (!) 21  (!) 22 (!) 22  Temp:  98.2 F (36.8 C)    TempSrc:      SpO2: 100%  95% 96%  Weight:      Height:        Constitutional: NAD, calm, comfortable Vitals:   07/02/16 0200 07/02/16 0223 07/02/16 0230 07/02/16 0245  BP: (!) 158/111  (!) 167/117 (!) 153/106  Pulse: 82  81 84  Resp: (!) 21  (!) 22 (!) 22  Temp:  98.2 F (36.8 C)    TempSrc:      SpO2: 100%  95% 96%  Weight:      Height:       Eyes: PERRL, lids and conjunctivae normal ENMT: Mucous membranes are moist. Normal dentition Neck: normal, supple, no masses Respiratory: clear to auscultation bilaterally, no wheezing, no crackles. Normal respiratory effort. Cardiovascular: Regular rate and normal rhythm, no murmurs / rubs / gallops. No extremity edema.  Abdomen: NT, ND, +BS Musculoskeletal: no clubbing / cyanosis.   Normal muscle tone.  Skin: + mild rash to the wrists and hands, macular in nature, no pustules.  Neurologic: CN 2-12 grossly intact. Sensation intact to light touch, Strength 5/5 in all 4, but he was slow to react to commands on the right.  He had some discoordination in finger to nose, but this could have been from misunderstanding the queues.  He was fluent and had no issue with naming.   Psychiatric: Normal judgment and insight. Alert and oriented x 3. Normal mood.    Labs on Admission: I have personally reviewed following labs and imaging studies  CBC:  Recent Labs Lab 07/02/16 0101 07/02/16 0112  WBC 8.6  --   NEUTROABS 3.5  --   HGB 15.3 15.0  HCT 43.8 44.0  MCV 88.7  --   PLT 231  --    Basic Metabolic Panel:  Recent Labs Lab 07/02/16 0101 07/02/16 0112  NA 138 139  K 3.9 3.7  CL 103 106  CO2 21*  --   GLUCOSE 122* 124*  BUN 17 19  CREATININE 1.20 1.10  CALCIUM 9.6  --    GFR: Estimated Creatinine Clearance: 101.6 mL/min (by C-G formula based on SCr of 1.1 mg/dL). Liver Function Tests:  Recent Labs Lab 07/02/16 0101  AST 70*  ALT 95*  ALKPHOS 66  BILITOT 0.6  PROT 7.0  ALBUMIN 4.1   No results for input(s): LIPASE, AMYLASE in the last 168 hours. No results for input(s): AMMONIA in the last 168 hours. Coagulation Profile:  Recent Labs Lab 07/02/16 0101  INR 0.95   Cardiac Enzymes: No results for input(s): CKTOTAL, CKMB, CKMBINDEX, TROPONINI in the last 168 hours. BNP (last 3 results) No results for input(s): PROBNP in the last 8760 hours. HbA1C: No results for input(s): HGBA1C in the last 72 hours. CBG:  Recent Labs Lab 07/02/16 0048  GLUCAP 123*   Lipid Profile: No results for input(s): CHOL, HDL, LDLCALC, TRIG, CHOLHDL, LDLDIRECT in the last 72 hours. Thyroid Function Tests: No results for input(s): TSH, T4TOTAL, FREET4, T3FREE, THYROIDAB in the last 72 hours. Anemia Panel: No results for input(s): VITAMINB12, FOLATE, FERRITIN,  TIBC, IRON, RETICCTPCT in the last 72 hours. Urine analysis:    Component Value Date/Time   COLORURINE STRAW (A) 07/02/2016 0049   APPEARANCEUR CLEAR 07/02/2016 0049   LABSPEC 1.004 (L) 07/02/2016 0049   PHURINE 7.0 07/02/2016 [place] 07/02/2016 0049   HGBUR NEGATIVE 07/02/2016  [place] 07/02/2016 [place] 07/02/2016 0049   PROTEINUR NEGATIVE 07/02/2016 0049   NITRITE NEGATIVE 07/02/2016 0049   LEUKOCYTESUR NEGATIVE 07/02/2016 0049    Radiological Exams on Admission: Ct Head Code Stroke W/o Cm  Result Date: 07/02/2016 CLINICAL DATA:  Code stroke. Initial evaluation for acute right-sided weakness. EXAM: CT HEAD WITHOUT CONTRAST TECHNIQUE: Contiguous axial images were obtained from the base of the skull through the vertex without intravenous contrast. COMPARISON:  None. FINDINGS: Brain: Cerebral volume normal. No acute intracranial hemorrhage. No evidence for acute large vessel territory infarct. No mass lesion, midline shift or mass effect. No hydrocephalus. No extra-axial fluid collection. Vascular: No asymmetric hyperdense vessel. Skull: Scalp soft tissues within normal limits.  Calvarium intact. Sinuses/Orbits: Globes and orbital soft tissues within normal limits. Paranasal sinuses and mastoid air cells are clear. Other: None ASPECTS ([place] Stroke Program Early CT Score) - Ganglionic level infarction (caudate, lentiform nuclei, internal capsule, insula, M1-M3 cortex): 7 - Supraganglionic infarction (M4-M6 cortex): 3 Total score (0-10 with 10 being normal): 10 IMPRESSION: 1. No acute intracranial infarct or other process identified. 2. ASPECTS is 10 Critical Value/emergent results were called by telephone at the time of interpretation on 07/02/2016 at 1:08 am to Dr. Nicole Kindred , who verbally acknowledged these results. Electronically Signed   By: Jeannine Boga M.D.   On: 07/02/2016 01:10    EKG: Independently reviewed. NSR, no TWI or  ST changes.    Assessment/Plan Intermittent Neuro symptoms, possibly a TIA (transient ischemic attack) - Symptoms are intermittent in nature and resolve between events raising the possibility of MS, atypical migraines with headache associated and TIA related to elevated BP  - MRI brain, MRA neck - I think this will give information about the above diagnoses - A1C and lipid panel - Carotid dopplers - BP is now 741 systolic, monitor, after MRI would decide about allowing permissive HTN - Neurology consulted, reviewed note - recommended hypercoag panel given age - TTE - Telemetry  Rash  - Mild in nature, just on wrists, reports no tick exposure, reports no fever or new medications - Monitor, if worsening or any other changes, consider treating empirically for RMSF given LFT abnormalities and neuro changes (though these two things are usually seen in later stages of the disease and the rash is very mild, given season, would keep a high index of suspicion)  Mild LFT elevation - Trend with CMET in the AM - See discussion of rash    Hypertension - BP was elevated on presentation improved with labetalol IV - He reports adherence to his clonidine, continue this medication once MRI done.  If no stroke, restart clonidine    Anxiety - Untreated currently, monitor    Gout - Reports being on allopurinol and colcrys - hold in acute setting.  Restart on discharge if needed   DVT prophylaxis: Lovenox Code Status: Full  Disposition Plan: Admit to work up acute neuro changes  Consults called: Neurology - Dr. Rochele Pages  Admission status: Obs, telemetry   Gilles Chiquito MD [facility] Pager [phone]  If 7PM-7AM, please contact night-coverage [email or web address] Password [facility]  07/02/2016, 3:14 AM

## 2016-07-02 NOTE — ED Notes (Signed)
Upon entering room to transport patient upstairs, patient states that he "feels a little funny". Instructed patient to lay back down in bed. This EMT noticed, upon patients return to bed, that there was a slight slur in speech along with right facial droop with smile. This EMT immediately reached out to RN for this noted neurological change.

## 2016-07-02 NOTE — Code Documentation (Addendum)
Code stroke called at 2351, pt in CT scanner on arrival. RN reports pt had acute onset of R sided weakness and slurred speech.  Pt had similar episode of s/s at approx 2100 which resolved spontaneously.  Assisted w/ transport back to room.  Initial NIH 3. TPA not given, symptoms mildly improving.  Dr. Roseanne Reno at bedside.  TRH to admit.

## 2016-07-02 NOTE — ED Notes (Signed)
Patient transported to CT 

## 2016-07-02 NOTE — Progress Notes (Signed)
Called to re-evaluate patient at around 4am.  Recurrence of symptoms including facial droop, slurred speech, right sided weakness.  Dr. Roseanne Reno with Neurology called and patient to receive TPA and will be admitted to the Neurology service.   Debe Coder, MD

## 2016-07-02 NOTE — ED Notes (Signed)
ED Tech reported neuro change in patient while preparing him for transport.

## 2016-07-02 NOTE — H&P (Signed)
Admission H&P    Chief Complaint: Intermittent slurring of speech, facial droop and right-sided weakness.  HPI: Jack [name] is an 46 y.o. male history of hypertension and anxiety presenting with complaints of blurred vision as well as slurred speech, intermittent visual droop and right sided weakness. Initial episode of weakness occurred at about 9 PM tonight and was subsequently cleared. He had recurrence of weakness at 11:51 PM, which also resolved. He experienced a third episode of a shoe weakness and right hemiparesis of sudden onset at 4:00 AM today. He has not been on antiplatelet therapy. Blood pressure was markedly elevated and patient is currently on Cardene drip, having failed dimension with labetalol IV. NIH stroke score was 6. He was deemed a candidate for TPA which was administered. There was delay in getting TPA started due to acute intervention for blood pressure management.  LSN:  4:00 AM on 07/02/2016 tPA Given:  yes mRankin:      Past Medical History:  Diagnosis Date  . Anxiety   . Hypertension     History reviewed. No pertinent surgical history.  No family history on file. Social History:  reports that he has never smoked. He has never used smokeless tobacco. He reports that he does not drink alcohol or use drugs.  Allergies:      Allergies  Allergen Reactions  . Levaquin [Levofloxacin In D5w] Itching and Swelling    Medications: Preadmission medications were reviewed by me.  ROS: History obtained from the patient  General ROS: negative for - chills, fatigue, fever, night sweats, weight gain or weight loss Psychological ROS: negative for - behavioral disorder, hallucinations, memory difficulties, mood swings or suicidal ideation Ophthalmic ROS: negative for - blurry vision, double vision, eye pain or loss of vision ENT ROS: negative for - epistaxis, nasal discharge, oral lesions, sore throat, tinnitus or vertigo Allergy and Immunology ROS:  negative for - hives or itchy/watery eyes Hematological and Lymphatic ROS: negative for - bleeding problems, bruising or swollen lymph nodes Endocrine ROS: negative for - galactorrhea, hair pattern changes, polydipsia/polyuria or temperature intolerance Respiratory ROS: negative for - cough, hemoptysis, shortness of breath or wheezing Cardiovascular ROS: negative for - chest pain, dyspnea on exertion, edema or irregular heartbeat Gastrointestinal ROS: negative for - abdominal pain, diarrhea, hematemesis, nausea/vomiting or stool incontinence Genito-Urinary ROS: negative for - dysuria, hematuria, incontinence or urinary frequency/urgency Musculoskeletal ROS: negative for - joint swelling or muscular weakness Neurological ROS: as noted in HPI Dermatological ROS: negative for rash and skin lesion changes  Physical Examination: Blood pressure (!) 165/101, pulse 83, temperature 98.6 F (37 C), temperature source Oral, resp. rate (!) 25, height 6' (1.829 m), weight 99.8 kg (220 lb), SpO2 95 %.  HEENT-  Normocephalic, no lesions, without obvious abnormality.  Normal external eye and conjunctiva.  Normal TM's bilaterally.  Normal auditory canals and external ears. Normal external nose, mucus membranes and septum.  Normal pharynx. Neck supple with no masses, nodes, nodules or enlargement. Cardiovascular - regular rate and rhythm, S1, S2 normal, no murmur, click, rub or gallop Lungs - chest clear, no wheezing, rales, normal symmetric air entry Abdomen - soft, non-tender; bowel sounds normal; no masses,  no organomegaly Extremities - no joint deformities, effusion, or inflammation  Neurologic Examination: Mental Status: Alert, oriented, no acute distress.  Speech slightly dysarthric without evidence of aphasia. Able to follow commands without difficulty. Cranial Nerves: II-Visual fields were normal. III/IV/VI-Pupils were equal and reacted normally to light. Extraocular movements were full and  conjugate.    V/VII-no facial numbness and no facial weakness. VIII-normal. X-slight dysarthria. XI: trapezius strength/neck flexion strength normal bilaterally XII-midline tongue extension with normal strength. Motor: Mild drift of right extremities, upper greater than lower; motor exam otherwise unremarkable Sensory: Normal throughout. Deep Tendon Reflexes: 1+ and symmetric. Plantars: Flexor bilaterally Cerebellar: Normal finger-to-nose testing. Carotid auscultation: Normal  Lab Results Last 48 Hours        Results for orders placed or performed during the hospital encounter of 07/02/16 (from the past 48 hour(s))  CBG monitoring, ED     Status: Abnormal   Collection Time: 07/02/16 12:48 AM  Result Value Ref Range   Glucose-Capillary 123 (H) 65 - 99 mg/dL   Comment 1 Notify RN    Comment 2 Document in Chart   I-stat troponin, ED (not at [facility], [facility])     Status: None   Collection Time: 07/02/16  1:10 AM  Result Value Ref Range   Troponin i, poc 0.00 0.00 - 0.08 ng/mL   Comment 3            Comment: Due to the release kinetics of cTnI, a negative result within the first hours of the onset of symptoms does not rule out myocardial infarction with certainty. If myocardial infarction is still suspected, repeat the test at appropriate intervals.   I-Stat Chem 8, ED  (not at [facility])     Status: Abnormal   Collection Time: 07/02/16  1:12 AM  Result Value Ref Range   Sodium 139 135 - 145 mmol/L   Potassium 3.7 3.5 - 5.1 mmol/L   Chloride 106 101 - 111 mmol/L   BUN 19 6 - 20 mg/dL   Creatinine, Ser 0.45 0.61 - 1.24 mg/dL   Glucose, Bld 409 (H) 65 - 99 mg/dL   Calcium, Ion 8.11 (L) 1.15 - 1.40 mmol/L   TCO2 23 0 - 100 mmol/L   Hemoglobin 15.0 13.0 - 17.0 g/dL   HCT 91.4 78.2 - 95.6 %      Imaging Results (Last 48 hours)  Ct Head Code Stroke W/o Cm  Result Date: 07/02/2016 CLINICAL DATA:  Code stroke. Initial evaluation for acute right-sided  weakness. EXAM: CT HEAD WITHOUT CONTRAST TECHNIQUE: Contiguous axial images were obtained from the base of the skull through the vertex without intravenous contrast. COMPARISON:  None. FINDINGS: Brain: Cerebral volume normal. No acute intracranial hemorrhage. No evidence for acute large vessel territory infarct. No mass lesion, midline shift or mass effect. No hydrocephalus. No extra-axial fluid collection. Vascular: No asymmetric hyperdense vessel. Skull: Scalp soft tissues within normal limits.  Calvarium intact. Sinuses/Orbits: Globes and orbital soft tissues within normal limits. Paranasal sinuses and mastoid air cells are clear. Other: None ASPECTS (Alberta Stroke Program Early CT Score) - Ganglionic level infarction (caudate, lentiform nuclei, internal capsule, insula, M1-M3 cortex): 7 - Supraganglionic infarction (M4-M6 cortex): 3 Total score (0-10 with 10 being normal): 10 IMPRESSION: 1. No acute intracranial infarct or other process identified. 2. ASPECTS is 10 Critical Value/emergent results were called by telephone at the time of interpretation on 07/02/2016 at 1:08 am to Dr. Roseanne Reno , who verbally acknowledged these results. Electronically Signed   By: Rise Mu M.D.   On: 07/02/2016 01:10     Assessment: 46 y.o. male history of hypertension presenting with probable acute left MCA territory subcortical ischemic infarction, as well as hypertensive urgency requiring acute intervention of blood pressure control. Pattern of stroke symptoms is stuttering, indicative of possible thrombo-embolic phenomena of  arterial source. CT angiogram of head and neck is pending.  Stroke Risk Factors - hypertension  Plan: 1. HgbA1c, fasting lipid panel 2. MRI of the brain without contrast 3. PT consult, OT consult, Speech consult 4. Echocardiogram 5. Prophylactic therapy-Antiplatelet med: Aspirin  6. Risk factor modification 7. Telemetry monitoring 8. Hypercoagulopathy panel  This patient is  critically ill and at significant risk of neurological worsening or death, and care requires constant monitoring of vital signs, hemodynamics,respiratory and cardiac monitoring, neurological assessment, discussion with family, other specialists and medical decision making of high complexity. Total critical care time was 90 minutes.  C.R. Roseanne Reno, MD [facility] [phone]

## 2016-07-02 NOTE — Evaluation (Signed)
Speech Language Pathology Evaluation Patient Details Name: Jack [name] MRN: [id number] DOB: [birth year] Today's Date: 07/02/2016 Time: 4098-1191 SLP Time Calculation (min) (ACUTE ONLY): 19 min  Problem List:  Patient Active Problem List   Diagnosis Date Noted  . TIA (transient ischemic attack) 07/02/2016  . CVA (cerebral vascular accident) (HCC) 07/02/2016  . Hypertension   . Anxiety   . Gout    Past Medical History:  Past Medical History:  Diagnosis Date  . Anxiety   . Gout   . Hypertension    Past Surgical History: History reviewed. No pertinent surgical history. HPI:  Pt is a46 y.o.malewith history of HTN and anxietypresenting with intermittent slurring of speech, facial droop and right-sided weakness. CT Head negative, MRI pending. He did receive IV tPA.     Assessment / Plan / Recommendation Clinical Impression  Pt appears to be at his cognitive-linguistic baseline, although he has a moderate dysarthria that impacts intelligibility at the sentence level. His articulation is imprecise and his rate becomes more rapid at the sentence and conversational level. He will benefit from SLP f/u to maximize functional communication.    SLP Assessment  SLP Recommendation/Assessment: Patient needs continued Speech Lanaguage Pathology Services SLP Visit Diagnosis: Dysarthria and anarthria (R47.1)    Follow Up Recommendations   (tba)    Frequency and Duration min 2x/week  2 weeks      SLP Evaluation Cognition  Overall Cognitive Status: Within Functional Limits for tasks assessed Orientation Level: Oriented X4       Comprehension  Auditory Comprehension Overall Auditory Comprehension: Appears within functional limits for tasks assessed    Expression Expression Primary Mode of Expression: Verbal   Oral / Motor  Motor Speech Overall Motor Speech: Impaired Respiration: Within functional limits Phonation: Normal Resonance: Within functional limits Articulation:  Impaired Level of Impairment: Sentence Intelligibility: Intelligibility reduced Sentence: 50-74% accurate   GO                    Maxcine Ham 07/02/2016, 3:23 PM  Maxcine Ham, M.A. CCC-SLP [phone]

## 2016-07-02 NOTE — Progress Notes (Signed)
STROKE TEAM PROGRESS NOTE   SUBJECTIVE (INTERVAL HISTORY) His family is at the bedside. Blood pressure is adequately controlled He states  He his doing better   OBJECTIVE Temp:  [98.2 F (36.8 C)-98.6 F (37 C)] 98.6 F (37 C) (04/19 0800) Pulse Rate:  [75-121] 103 (04/19 0830) Cardiac Rhythm: Sinus tachycardia (04/19 0800) Resp:  [14-32] 17 (04/19 0830) BP: (124-182)/(81-123) 148/82 (04/19 0830) SpO2:  [94 %-100 %] 97 % (04/19 0830) Weight:  [97.9 kg (215 lb 12.8 oz)-99.8 kg (220 lb)] 97.9 kg (215 lb 12.8 oz) (04/19 0600)  CBC:  Recent Labs Lab 07/02/16 0101 07/02/16 0112 07/02/16 0655  WBC 8.6  --  11.3*  NEUTROABS 3.5  --   --   HGB 15.3 15.0 16.2  HCT 43.8 44.0 47.1  MCV 88.7  --  88.9  PLT 231  --  177    Basic Metabolic Panel:  Recent Labs Lab 07/02/16 0101 07/02/16 0112  NA 138 139  K 3.9 3.7  CL 103 106  CO2 21*  --   GLUCOSE 122* 124*  BUN 17 19  CREATININE 1.20 1.10  CALCIUM 9.6  --     Lipid Panel: No results found for: CHOL, TRIG, HDL, CHOLHDL, VLDL, LDLCALC HgbA1c: No results found for: HGBA1C  PHYSICAL EXAM Pleasant middle aged Caucasian male not in distress.  . Afebrile. Head is nontraumatic. Neck is supple without bruit.    Cardiac exam no murmur or gallop. Lungs are clear to auscultation. Distal pulses are well felt. Neurological Exam :  Awake alert oriented 3. Mild dysarthria and can be understood. Follows commands well. Extraocular moments are full range without nystagmus. Fundi were not visualized. Vision acuity and fields seem adequate. Mild right lower facial weakness. Tongue midline. Motor system exam shows mild right upper and lower neck negative. Mild right hemiparesis 4/5 strength with weakness of right grip and intrinsic hand muscles. Diminished fine finger movements on the right. Orbits left over right upper extremity. Sensation intact bilaterally. Deep tendon reflexes are symmetric. Plantars are downgoing. Gait was not  tested. ASSESSMENT/PLAN [name] is a 46 y.o. male with history of HTN and anxiety presenting with intermittent slurring of speech, facial droop and right-sided weakness. He received IV t-PA 07/02/2016 at 0451.   Stroke:   Likely L MCA infarct without ELVO s/p IV tPA, source workup underway   Resultant  R hemiparesis, facial droop  Code Stroke CT no acute stroke. Aspects 10.    CTA head & neck no ELVO. Mild carotid plaque w/o stenosis. Emphysema.  MRI  pending   MRA  pending   24h CT pending   Carotid Doppler  canceled  2D Echo  pending   LDL pending   HgbA1c pending  HIV pending   Lovenox ordered to start for VTE prophylaxis 4/20, added SCDs  Diet regular Room service appropriate? Yes; Fluid consistency: Thin  No antithrombotic prior to admission, now on No antithrombotic as within 24h of tPA administration. Plan aspirin at 24h if imaging negative for hemorrhage  Therapy recommendations:  Pending. Keep in bed today d/t elevated BP  Disposition:  pending   Hypertensive Emergency  BP as high as 182/111  Failed labetalol, now on Cardene.   Stable currently  Passed swallow.   Resume home catapress  BP goal per post tPA guidelines  Other Stroke Risk Factors  UDS / ETOH level negative   Overweight, Body mass index is 29.27 kg/m.  Other Active Problems  Leukocytosis 8.6->11.3.  UA negative. No CXR. Will follow.  Hospital day # 0   I have personally examined this patient, reviewed notes, independently viewed imaging studies, participated in medical decision making and plan of care.ROS completed by me personally and pertinent positives fully documented  I have made any additions or clarifications directly to the above note. Agree with note above. He presented with right facial droop and right hemiplegia secondary to left hemispheric subcortical infarct. He received IV tPA. Recommend strict blood pressure and neurological follow-up as per post TPA  protocol. Check MRI scan of the brain as well as echocardiogram later today. Long discussion with patient and family at the bedside and answered questions.This patient is critically ill and at significant risk of neurological worsening, death and care requires constant monitoring of vital signs, hemodynamics,respiratory and cardiac monitoring, extensive review of multiple databases, frequent neurological assessment, discussion with family, other specialists and medical decision making of high complexity.I have made any additions or clarifications directly to the above note.This critical care time does not reflect procedure time, or teaching time or supervisory time of PA/NP/Med Resident etc but could involve care discussion time.  I spent 30 minutes of neurocritical care time  in the care of  this patient.      Delia Heady, MD Medical Director [facility] Pager: [phone] 07/02/2016 1:51 PM   To contact Stroke Continuity provider, please refer to [email or web address]. After hours, contact General Neurology

## 2016-07-02 NOTE — Progress Notes (Signed)
PT Cancellation Note  Patient Details Name: Jack [name] MRN: [id number] DOB: Jun 12, 1970   Cancelled Treatment:    Reason Eval/Treat Not Completed: Patient not medically ready, on strict bed rest orders. Will follow-up for PT evaluation when medically appropriate as time allows.  Dewayne Hatch, SPT [phone]  Ina Homes 07/02/2016, 8:08 AM

## 2016-07-02 NOTE — Progress Notes (Signed)
  Echocardiogram has been performed.  Jack [name] 07/02/2016, 11:10 AM

## 2016-07-03 ENCOUNTER — Inpatient Hospital Stay (HOSPITAL_COMMUNITY): Payer: BLUE CROSS/BLUE SHIELD

## 2016-07-03 ENCOUNTER — Encounter (HOSPITAL_COMMUNITY): Payer: Self-pay | Admitting: Emergency Medicine

## 2016-07-03 DIAGNOSIS — F4001 Agoraphobia with panic disorder: Secondary | ICD-10-CM

## 2016-07-03 LAB — HEMOGLOBIN A1C
Hgb A1c MFr Bld: 6.1 % — ABNORMAL HIGH (ref 4.8–5.6)
MEAN PLASMA GLUCOSE: 128 mg/dL

## 2016-07-03 LAB — CBC
HCT: 45.2 % (ref 39.0–52.0)
Hemoglobin: 15.1 g/dL (ref 13.0–17.0)
MCH: 30.1 pg (ref 26.0–34.0)
MCHC: 33.4 g/dL (ref 30.0–36.0)
MCV: 90 fL (ref 78.0–100.0)
PLATELETS: 189 10*3/uL (ref 150–400)
RBC: 5.02 MIL/uL (ref 4.22–5.81)
RDW: 13.7 % (ref 11.5–15.5)
WBC: 9.8 10*3/uL (ref 4.0–10.5)

## 2016-07-03 LAB — GLUCOSE, CAPILLARY
GLUCOSE-CAPILLARY: 145 mg/dL — AB (ref 65–99)
GLUCOSE-CAPILLARY: 156 mg/dL — AB (ref 65–99)
GLUCOSE-CAPILLARY: 133 mg/dL — AB (ref 65–99)
GLUCOSE-CAPILLARY: 116 mg/dL — AB (ref 65–99)
Glucose-Capillary: 140 mg/dL — ABNORMAL HIGH (ref 65–99)

## 2016-07-03 MED ORDER — METOPROLOL TARTRATE 12.5 MG HALF TABLET
12.5000 mg | ORAL_TABLET | Freq: Two times a day (BID) | ORAL | Status: DC
  Administered 2016-07-03 – 2016-07-04 (×3): 12.5 mg via ORAL
  Filled 2016-07-03 (×3): qty 1

## 2016-07-03 MED ORDER — LORAZEPAM 2 MG/ML IJ SOLN
INTRAMUSCULAR | Status: AC
  Filled 2016-07-03: qty 1

## 2016-07-03 MED ORDER — FAMOTIDINE 10 MG PO TABS
10.0000 mg | ORAL_TABLET | Freq: Every day | ORAL | Status: DC
  Administered 2016-07-03 – 2016-07-04 (×2): 10 mg via ORAL
  Filled 2016-07-03 (×2): qty 1

## 2016-07-03 MED ORDER — LORAZEPAM 2 MG/ML IJ SOLN
1.0000 mg | Freq: Once | INTRAMUSCULAR | Status: AC
Start: 2016-07-03 — End: 2016-07-03
  Administered 2016-07-03: 1 mg via INTRAVENOUS

## 2016-07-03 MED ORDER — PREDNISONE 5 MG PO TABS
10.0000 mg | ORAL_TABLET | Freq: Every day | ORAL | Status: DC
  Administered 2016-07-04: 10 mg via ORAL
  Filled 2016-07-03: qty 2
  Filled 2016-07-03: qty 1

## 2016-07-03 MED ORDER — CLONIDINE HCL 0.1 MG PO TABS
0.1000 mg | ORAL_TABLET | Freq: Two times a day (BID) | ORAL | Status: DC
  Administered 2016-07-03 – 2016-07-04 (×3): 0.1 mg via ORAL
  Filled 2016-07-03 (×4): qty 1

## 2016-07-03 MED ORDER — ASPIRIN 325 MG PO TABS
325.0000 mg | ORAL_TABLET | Freq: Every day | ORAL | Status: DC
  Administered 2016-07-03 – 2016-07-04 (×2): 325 mg via ORAL
  Filled 2016-07-03 (×2): qty 1

## 2016-07-03 MED ORDER — ATORVASTATIN CALCIUM 80 MG PO TABS
80.0000 mg | ORAL_TABLET | Freq: Every day | ORAL | Status: DC
  Administered 2016-07-03 – 2016-07-04 (×2): 80 mg via ORAL
  Filled 2016-07-03 (×2): qty 1

## 2016-07-03 MED ORDER — HYDRALAZINE HCL 20 MG/ML IJ SOLN
10.0000 mg | Freq: Four times a day (QID) | INTRAMUSCULAR | Status: DC | PRN
Start: 2016-07-03 — End: 2016-07-04
  Administered 2016-07-03: 10 mg via INTRAVENOUS
  Filled 2016-07-03: qty 1

## 2016-07-03 NOTE — Progress Notes (Signed)
  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 SLP provided Min cues for use of speech intelligibility strategies during sentence reading task, with emphasis on over-articulation and pausing between words. He does not yet carry this over to conversational speech, but he uses multimodal communication including gestures to express even more complex information. Recommend CIR level therapy upon d/c.   HPI HPI: Pt is a46 y.o.malewith history of HTN and anxietypresenting with intermittent slurring of speech, facial droop and right-sided weakness. CT Head negative, MRI pending. He did receive IV tPA.        SLP Plan  Continue with current plan of care       Recommendations                   Follow up Recommendations: Inpatient Rehab SLP Visit Diagnosis: Dysarthria and anarthria (R47.1) Plan: Continue with current plan of care       GO                Maxcine Ham 07/03/2016, 3:57 PM  Maxcine Ham, M.A. CCC-SLP [phone]

## 2016-07-03 NOTE — Evaluation (Signed)
Physical Therapy Evaluation Patient Details Name: Jack [name] MRN: [id number] DOB: [birth year] Today's Date: 07/03/2016   History of Present Illness  Patient is a 46 yo male admitted 07/02/16 with Rt sided facial droop, dysarthria, RUE/RLE weakness.  Patient given tPA at 07/02/16 - 04:51.     PMH:  anxiety, gout, HTN  Clinical Impression  Patient presents with problems listed below.  Will benefit from acute PT to maximize functional mobility prior to discharge.  Patient independent and working pta.  Today requiring mod assist for mobility and stance.  Patient very motivated.  Recommend Inpatient Rehab consult with goal to return home with family.    Follow Up Recommendations CIR    Equipment Recommendations  Other (comment) (TBD at next venue)    Recommendations for Other Services Rehab consult     Precautions / Restrictions Precautions Precautions: Fall Restrictions Weight Bearing Restrictions: No      Mobility  Bed Mobility Overal bed mobility: Needs Assistance Bed Mobility: Rolling;Sidelying to Sit;Sit to Supine Rolling: Min guard (Rolling to right using LUE to assist) Sidelying to sit: Mod assist   Sit to supine: Min assist   General bed mobility comments: Verbal cues for technique.  Able to roll to right side with use of LUE and rail.  Will be more difficult toward Lt side.  Mod assist to raise trunk to sitting position.  Once upright, paient with fair sitting balance.  Min assist to return to supine - LE's onto bed.  Transfers Overall transfer level: Needs assistance Equipment used:  (Taller chair back) Transfers: Sit to/from Stand Sit to Stand: Min assist;Mod assist         General transfer comment: Verbal cues for hand placement and to scoot to edge of bed.  Patient having difficulty remembering RUE.  Patient initially moved sit <> stand with mod assist for balance, and control during descent onto bed.  Practiced sit <> stand focusing on weightbearing on RLE,  moving to stance with trunk straight, and controlled descent x 4.  In stance, also worked on weight shifting Lt <> Rt, and taking steps in place.  Ambulation/Gait Ambulation/Gait assistance: Mod assist;Min assist Ambulation Distance (Feet):  (Patient able to take steps in place with assist for balance.) Assistive device:  (Holding back of tall chair.)          Stairs            Wheelchair Mobility    Modified Rankin (Stroke Patients Only) Modified Rankin (Stroke Patients Only) Pre-Morbid Rankin Score: No symptoms Modified Rankin: Moderately severe disability     Balance Overall balance assessment: Needs assistance Sitting-balance support: No upper extremity supported;Feet supported Sitting balance-Leahy Scale: Fair     Standing balance support: Single extremity supported Standing balance-Leahy Scale: Poor Standing balance comment: Requires UE support to maintain balance.                             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  Hand Dominance   Dominant Hand: Right    Extremity/Trunk Assessment   Upper Extremity Assessment Upper Extremity Assessment: Defer to OT evaluation    Lower Extremity Assessment Lower Extremity Assessment: RLE deficits/detail RLE Deficits / Details: Decreased strength at grossly 4-/5 throughout.  Sensation intact. RLE Coordination: decreased gross motor       Communication    Communication: Expressive difficulties  Cognition Arousal/Alertness: Awake/alert Behavior During Therapy: WFL for tasks assessed/performed Overall Cognitive Status: Within Functional Limits for tasks assessed                                        General Comments      Exercises Other Exercises Other Exercises: Half-bridging with RLE Other Exercises: Lie in hooklying position.  Bring knees together and apart for control of LE's.   Assessment/Plan    PT Assessment Patient needs continued PT services  PT Problem List Decreased strength;Decreased activity tolerance;Decreased balance;Decreased mobility;Decreased coordination;Decreased knowledge of use of DME;Obesity       PT Treatment Interventions DME instruction;Gait training;Functional mobility training;Therapeutic activities;Therapeutic exercise;Balance training;Neuromuscular re-education;Patient/family education    PT Goals (Current goals can be found in the Care Plan section)  Acute Rehab PT Goals Patient Stated Goal: To walk PT Goal Formulation: With patient Time For Goal Achievement: 07/10/16 Potential to Achieve Goals: Good    Frequency Min 4X/week   Barriers to discharge        Co-evaluation               End of Session Equipment Utilized During Treatment: Gait belt Activity Tolerance: Patient tolerated treatment well Patient left: in bed;with call bell/phone within reach;with bed alarm set;with family/visitor present Nurse Communication: Mobility status PT Visit Diagnosis: Unsteadiness on feet (R26.81);Other abnormalities of gait and mobility (R26.89);Hemiplegia and hemiparesis;Other symptoms and signs involving the nervous system (R29.898) Hemiplegia - Right/Left: Right Hemiplegia - dominant/non-dominant: Dominant Hemiplegia - caused by: Cerebral infarction    Time: 1441-1510 PT Time Calculation (min) (ACUTE ONLY): 29 min   Charges:   PT Evaluation $PT Eval Moderate Complexity: 1  Procedure PT Treatments $Therapeutic Activity: 8-22 mins   PT G Codes:        Durenda Hurt. Renaldo Fiddler, [facility] Acute Rehab Services Pager [phone]   Vena Austria 07/03/2016, 8:41 PM

## 2016-07-03 NOTE — Progress Notes (Signed)
Pt anxious at this time. BP reading 179/119. Notified Dr. Roxy Manns via page, awaiting call back

## 2016-07-03 NOTE — Progress Notes (Signed)
Patient taken down to MRI to have 24 hour post TPA scan completed. Once in MRI after approximately 10 minutes of having MRI, patient stated he could not continue with MRI and reported he was having a panic attack. Patient hyperventilating and refused to continue with MRI. Patient agreed to go to CT at that time to complete CT head. After patient laid on CT table, patient began hyperventilating again and stated he needed some time. MD called for anxiety medication. Ativan given as directed in CT. Patient proceeded with scan when he stated he felt he was capable. MD notified of delay in getting scan. Awaiting results at this time. Will continue to monitor patient.

## 2016-07-03 NOTE — Progress Notes (Signed)
STROKE TEAM PROGRESS NOTE   SUBJECTIVE (INTERVAL HISTORY)  He is doing better. He has history of severe agoraphobia and panic but is currently not on medications and has not seen a psychiatrist for years.He is refusing  further tests and wants to go home   OBJECTIVE Temp:  [98.1 F (36.7 C)-98.7 F (37.1 C)] 98.6 F (37 C) (04/20 1123) Pulse Rate:  [80-136] 109 (04/20 1130) Cardiac Rhythm: Sinus tachycardia (04/20 0800) Resp:  [11-30] 14 (04/20 1130) BP: (134-174)/(70-119) 146/99 (04/20 1130) SpO2:  [91 %-100 %] 97 % (04/20 1130)  CBC:  Recent Labs Lab 07/02/16 0101  07/02/16 0655 07/03/16 0311  WBC 8.6  --  11.3* 9.8  NEUTROABS 3.5  --   --   --   HGB 15.3  < > 16.2 15.1  HCT 43.8  < > 47.1 45.2  MCV 88.7  --  88.9 90.0  PLT 231  --  177 189  < > = values in this interval not displayed.  Basic Metabolic Panel:   Recent Labs Lab 07/02/16 0101 07/02/16 0112 07/02/16 0655  NA 138 139 139  K 3.9 3.7 3.8  CL 103 106 103  CO2 21*  --  25  GLUCOSE 122* 124* 158*  BUN CREATININE 1.20 1.10 1.03  CALCIUM 9.6  --  9.7    PHYSICAL EXAM Pleasant middle aged Caucasian male not in distress. Afebrile. Head is nontraumatic. Neck is supple without bruit.    Cardiac exam no murmur or gallop. Lungs are clear to auscultation. Distal pulses are well felt. Neurological Exam :  Awake alert oriented 3. Mild dysarthria and can be understood. Follows commands well. Extraocular moments are full range without nystagmus. Fundi were not visualized. Vision acuity and fields seem adequate. Mild right lower facial weakness. Tongue midline. Motor system exam shows mild right upper and lower neck negative. Mild right hemiparesis 4/5 strength with weakness of right grip and intrinsic hand muscles. Diminished fine finger movements on the right. Orbits left over right upper extremity. Sensation intact bilaterally. Deep tendon reflexes are symmetric. Plantars are downgoing. Gait was not  tested.   ASSESSMENT/PLAN [name] is a 46 y.o. male with history of HTN and anxiety presenting with intermittent slurring of speech, facial droop and right-sided weakness. He received IV t-PA 07/02/2016 at 0451.   Stroke:   Likely L MCA infarct without ELVO s/p IV tPA, source workup underway Ct head x 2 negative and patient refusing MRI  Resultant  R hemiparesis, facial droop  Code Stroke CT no acute stroke. Aspects 10.    CTA head & neck no ELVO. Mild carotid plaque w/o stenosis. Emphysema.  24h CT no hmg post tPA and no stroke  MRI  pending   MRA  pending   Carotid Doppler  canceled  2D Echo  EF 65-70%. No source of embolus   LDL 239  HgbA1c 6.1  HIV NR  Lovenox ordered to start for VTE prophylaxis 4/20, added SCDs Diet Heart Room service appropriate? Yes; Fluid consistency: Thin  No antithrombotic prior to admission, now on aspirin 325 mg daily   Therapy recommendations:  Pending. ok to be OOB   Disposition:  pending   Hypertensive Emergency  BP as high as 182/111  Failed labetalol, remains on Cardene.   Stable currently Home catapress resumeds BP goal  SBP below 180  Hyperlipidemia  LDL 239, goal LDL < 70   on no statin PTA  Added lipitor 80  mg daily  Other Stroke Risk Factors  UDS / ETOH level negative   Overweight, Body mass index is 29.27 kg/m.  Other Active Problems  Leukocytosis 8.6->11.3. UA negative. No CXR. Will follow.  Agoraphobia   panic anxiety  Hospital day # 1   I have personally examined this patient, reviewed notes, independently viewed imaging studies, participated in medical decision making and plan of care.ROS completed by me personally and pertinent positives fully documented  I have made any additions or clarifications directly to the above note. He likely has left hemispheric subcortical infarct likely from small vessel disease. Patient uncooperative for an MRI. He has severe agoraphobia and panic. Will  consult psychiatry to help D/w mother and patient. Greater than 50% time during this 35 minute visit was spent on counseling and coordination of care about stroke,agoraphobia, panic and answering questions Delia Heady, MD Medical Director [facility] Pager: [phone] 07/03/2016 2:29 PM   To contact Stroke Continuity provider, please refer to [email or web address]. After hours, contact General Neurology

## 2016-07-03 NOTE — Progress Notes (Signed)
Patient placed on 2 liters of oxygen via nasal canula. Oxygen saturation decreased while sleeping and patient more drowsy after receiving ativan intravenously for ct scan.

## 2016-07-03 NOTE — Progress Notes (Signed)
Stroke MD paged in regards to CT head being negative for hemorrhage and need for ASA order.

## 2016-07-03 NOTE — Progress Notes (Signed)
Inpatient Rehabilitation  Per SLP request, patient was screened by Fae Pippin for appropriateness for an Inpatient Acute Rehab consult.  At this time we are planning to follow along for PT/OT recommendations.  Please call with questions.    Charlane Ferretti., CCC/SLP Admission Coordinator  [facility] Inpatient Rehabilitation  Cell [phone]

## 2016-07-04 ENCOUNTER — Encounter (HOSPITAL_COMMUNITY): Payer: Self-pay | Admitting: *Deleted

## 2016-07-04 ENCOUNTER — Inpatient Hospital Stay (HOSPITAL_COMMUNITY): Payer: BLUE CROSS/BLUE SHIELD

## 2016-07-04 DIAGNOSIS — I63312 Cerebral infarction due to thrombosis of left middle cerebral artery: Secondary | ICD-10-CM

## 2016-07-04 DIAGNOSIS — M109 Gout, unspecified: Secondary | ICD-10-CM

## 2016-07-04 DIAGNOSIS — M1A9XX0 Chronic gout, unspecified, without tophus (tophi): Secondary | ICD-10-CM

## 2016-07-04 DIAGNOSIS — G459 Transient cerebral ischemic attack, unspecified: Secondary | ICD-10-CM

## 2016-07-04 DIAGNOSIS — I10 Essential (primary) hypertension: Secondary | ICD-10-CM

## 2016-07-04 DIAGNOSIS — E785 Hyperlipidemia, unspecified: Secondary | ICD-10-CM

## 2016-07-04 DIAGNOSIS — F4001 Agoraphobia with panic disorder: Secondary | ICD-10-CM

## 2016-07-04 LAB — BASIC METABOLIC PANEL
ANION GAP: 10 (ref 5–15)
BUN: 10 mg/dL (ref 6–20)
CO2: 24 mmol/L (ref 22–32)
Calcium: 9.6 mg/dL (ref 8.9–10.3)
Chloride: 106 mmol/L (ref 101–111)
Creatinine, Ser: 0.84 mg/dL (ref 0.61–1.24)
GFR calc Af Amer: >60 mL/min (ref >60)
GLUCOSE: 118 mg/dL — AB (ref 65–99)
Potassium: 3.6 mmol/L (ref 3.5–5.1)
Sodium: 140 mmol/L (ref 135–145)

## 2016-07-04 LAB — CBC
HEMATOCRIT: 45.8 % (ref 39.0–52.0)
HEMOGLOBIN: 15.6 g/dL (ref 13.0–17.0)
MCH: 30.6 pg (ref 26.0–34.0)
MCHC: 34.1 g/dL (ref 30.0–36.0)
MCV: 89.8 fL (ref 78.0–100.0)
Platelets: 176 10*3/uL (ref 150–400)
RBC: 5.1 MIL/uL (ref 4.22–5.81)
RDW: 13.5 % (ref 11.5–15.5)
WBC: 7.9 10*3/uL (ref 4.0–10.5)

## 2016-07-04 LAB — SEDIMENTATION RATE: Sed Rate: 3 mm/hr (ref 0–16)

## 2016-07-04 LAB — C-REACTIVE PROTEIN: CRP: <0.8 mg/dL (ref <1.0)

## 2016-07-04 MED ORDER — METOPROLOL TARTRATE 25 MG PO TABS: 25.0000 mg | ORAL_TABLET | Freq: Two times a day (BID) | ORAL | 5 refills | Status: DC

## 2016-07-04 MED ORDER — ATORVASTATIN CALCIUM 80 MG PO TABS: 80.0000 mg | ORAL_TABLET | Freq: Every day | ORAL | 5 refills | Status: DC

## 2016-07-04 MED ORDER — ASPIRIN 325 MG PO TABS: 325.0000 mg | ORAL_TABLET | Freq: Every day | ORAL | Status: AC

## 2016-07-04 MED ORDER — PANTOPRAZOLE SODIUM 40 MG PO TBEC: 40.0000 mg | DELAYED_RELEASE_TABLET | Freq: Every day | ORAL | Status: DC

## 2016-07-04 MED ORDER — METOPROLOL TARTRATE 25 MG PO TABS
25.0000 mg | ORAL_TABLET | Freq: Two times a day (BID) | ORAL | Status: DC
  Filled 2016-07-04: qty 1

## 2016-07-04 NOTE — Progress Notes (Signed)
PHARMACIST - PHYSICIAN COMMUNICATION DR:  Pearlean Brownie CONCERNING: Protonix IV to Oral Route Change Policy  RECOMMENDATION: This patient is receiving Protonix by the intravenous route.  Based on criteria approved by the Pharmacy and Therapeutics Committee, this drug is being converted to the equivalent oral dose form(s).  DESCRIPTION: These criteria include:  The patient is eating (either orally or via tube) and/or has been taking other orally administered medications for a least 24 hours  There is no active GI bleed or impaired GI absorption noted.   If you have questions about this conversion, please contact the Pharmacy Department    [phone] )  [facility]   [phone] )  [facility]    [phone] )  [facility]   [phone] )  [facility]    Georgina Pillion, PharmD, BCPS Clinical Pharmacist 07/04/2016 11:26 AM

## 2016-07-04 NOTE — Evaluation (Signed)
Occupational Therapy Evaluation Patient Details Name: Jack [name] MRN: [id number] DOB: Sep 10, 1970 Today's Date: 07/04/2016    History of Present Illness Patient is a 46 yo male admitted 07/02/16 with Rt sided facial droop, dysarthria, RUE/RLE weakness.  Patient given tPA at 07/02/16 - 04:51.     PMH:  anxiety, gout, HTN   Clinical Impression   Pt reports he was independent with ADL PTA. Currently pt min assist for stand pivot transfer and mod-max assist for ADL. Pt presenting with R sided weakness, decreased fine/gross motor coordination in his RUE, and impaired balance impacting his independence and safety with ADL and functional mobility. Discussed post acute rehab with pt; he reports that he prefers to go home with home health, however, feel pt would greatly benefit from CIR level thearpies to maximize independence and safety with ADL and functional mobility prior to return home. If pt does choose to return home he will need HHOT for follow up. Pt would benefit from continued skilled OT to address established goals.    Follow Up Recommendations  CIR;Supervision/Assistance - 24 hour    Equipment Recommendations  3 in 1 bedside commode;Tub/shower bench    Recommendations for Other Services       Precautions / Restrictions Precautions Precautions: Fall Restrictions Weight Bearing Restrictions: No      Mobility Bed Mobility Overal bed mobility: Needs Assistance Bed Mobility: Supine to Sit     Supine to sit: Min guard     General bed mobility comments: Increased time required with use of bed rail and HOB elevated. No physical assist; guarding for safety  Transfers Overall transfer level: Needs assistance Equipment used: 1 person hand held assist Transfers: Sit to/from [employer] Sit to Stand: Min assist Stand pivot transfers: Min assist       General transfer comment: Min assist for balance. Cues for sequencing and technique.    Balance Overall  balance assessment: Needs assistance Sitting-balance support: Feet supported;No upper extremity supported Sitting balance-Leahy Scale: Good     Standing balance support: Bilateral upper extremity supported Standing balance-Leahy Scale: Fair                             ADL either performed or assessed with clinical judgement   ADL Overall ADL's : Needs assistance/impaired Eating/Feeding: Minimal assistance;Sitting   Grooming: Minimal assistance;Sitting   Upper Body Bathing: Moderate assistance;Sitting   Lower Body Bathing: Maximal assistance;Sit to/from stand   Upper Body Dressing : Moderate assistance;Sitting   Lower Body Dressing: Maximal assistance;Sit to/from stand   Toilet Transfer: Minimal Chartered loss adjuster Details (indicate cue type and reason): Simulated by stand pivot EOB > chair         Functional mobility during ADLs: Minimal assistance (for stand pivot only) General ADL Comments: Encouraged functional use of RUE and AAROM/self ROM throughout the day. Discussed post acute rehab with pt; he reports he would like to return home with [facility] upon d/c-continue to feel CIR would be best d/c option.     Vision Baseline Vision/History: Wears glasses Wears Glasses: Distance only Patient Visual Report: No change from baseline Additional Comments: Appears WFL.     Perception     Praxis      Pertinent Vitals/Pain Pain Assessment: No/denies pain     Hand Dominance Right   Extremity/Trunk Assessment Upper Extremity Assessment Upper Extremity Assessment: RUE deficits/detail RUE Deficits / Details: Grossly 3+/5. Sensation intact. Moderate grip strength RUE Coordination: decreased  fine motor;decreased gross motor   Lower Extremity Assessment Lower Extremity Assessment: Defer to PT evaluation   Cervical / Trunk Assessment Cervical / Trunk Assessment: Normal   Communication Communication Communication: Expressive difficulties    Cognition Arousal/Alertness: Awake/alert Behavior During Therapy: WFL for tasks assessed/performed Overall Cognitive Status: Within Functional Limits for tasks assessed                                     General Comments       Exercises     Shoulder Instructions      Home Living Family/patient expects to be discharged to:: Private residence Living Arrangements: Spouse/significant other;Parent Available Help at Discharge: Family;Available 24 hours/day Type of Home: House Home Access: Stairs to enter [employer] of Steps: 3-4 Entrance Stairs-Rails: Right Home Layout: Two level;Able to live on main level with bedroom/bathroom     Bathroom Shower/Tub: Tub/shower unit;Door   [employer] Toilet: Standard     Home Equipment: Gilmer Mor - single point          Prior Functioning/Environment Level of Independence: Independent        Comments: Works in Therapist, sports, 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        OT Problem List: Decreased strength;Decreased range of motion;Impaired balance (sitting and/or standing);Decreased coordination;Decreased knowledge of use of DME or AE;Impaired tone;Impaired UE functional use      OT Treatment/Interventions: Self-care/ADL training;Therapeutic exercise;Neuromuscular education;Energy conservation;DME and/or AE instruction;Therapeutic activities;Patient/family education;Balance training    OT Goals(Current goals can be found in the care plan section) Acute Rehab OT Goals Patient Stated Goal: get back home OT Goal Formulation: With patient Time For Goal Achievement: 07/18/16 Potential to Achieve Goals: Good ADL Goals Pt Will Perform Eating: with set-up;sitting;with adaptive utensils Pt Will Perform Grooming: with min guard assist;standing;with adaptive equipment Pt Will Perform Upper Body Bathing: with set-up;with supervision;sitting Pt Will Perform Lower Body Bathing: with min guard assist;sit to/from  stand Pt Will Transfer to Toilet: with min guard assist;ambulating;bedside commode (over toilet) Pt Will Perform Toileting - Clothing Manipulation and hygiene: with min guard assist;sit to/from stand Pt Will Perform Tub/Shower Transfer: Tub transfer;with min guard assist;tub bench;ambulating  OT Frequency: Min 3X/week   Barriers to D/C:            Co-evaluation              End of Session Nurse Communication: Mobility status  Activity Tolerance: Patient tolerated treatment well Patient left: in chair;with call bell/phone within reach;with family/visitor present  OT Visit Diagnosis: Unsteadiness on feet (R26.81);Other abnormalities of gait and mobility (R26.89);Muscle weakness (generalized) (M62.81)                Time: 1610-9604 OT Time Calculation (min): 20 min Charges:  OT General Charges $OT Visit: 1 Procedure OT Evaluation $OT Eval Moderate Complexity: 1 Procedure G-Codes:     Zania Kalisz A. Brett Albino, M.S., OTR/L Pager: [phone]  Gaye Alken 07/04/2016, 1:45 PM

## 2016-07-04 NOTE — Discharge Instructions (Signed)
1. Follow up with your primary care provider in 2 weeks to have your blood pressure checked etc. 2. If you do not have a primary care provider please make arrangements to become established with one as soon as possible. 3. Gradually increase activity as tolerated. 4. Home health physical, occupational, and speech therapies will be scheduled. 5. Follow up with a psychologist or therapist as instructed. If a recommendation has not been made please call the psychologist of your choice or call Dr Lorenda Ishihara Akintayo's office for a recommendation, or try [facility] for an appointment. 6. Home nurse will check blood pressure, monitor medications, and assist with obtaining medical follow up. 7. Have your liver enzymes checked at your next office visit. 8. Avoid sugar and sweets. Blood sugar is too high  Aspirin and Your Heart Aspirin is a medicine that affects the way blood clots. Aspirin can be used to help reduce the risk of blood clots, heart attacks, and other heart-related problems. Should I take aspirin? Your health care provider will help you determine whether it is safe and beneficial for you to take aspirin daily. Taking aspirin daily may be beneficial if you:  Have had a heart attack or chest pain.  Have undergone open heart surgery such as coronary artery bypass surgery (CABG).  Have had coronary angioplasty.  Have experienced a stroke or transient ischemic attack (TIA).  Have peripheral vascular disease (PVD).  Have chronic heart rhythm problems such as atrial fibrillation. Are there any risks of taking aspirin daily? Daily use of aspirin can increase your risk of side effects. Some of these include:  Bleeding. Bleeding problems can be minor or serious. An example of a minor problem is a cut that does not stop bleeding. An example of a more serious problem is stomach bleeding or bleeding into the brain. Your risk of bleeding is increased if you are also taking  non-steroidal anti-inflammatory medicine (NSAIDs).  Increased bruising.  Upset stomach.  An allergic reaction. People who have nasal polyps have an increased risk of developing an aspirin allergy. What are some guidelines I should follow when taking aspirin?  Take aspirin only as directed by your health care provider. Make sure you understand how much you should take and what form you should take. The two forms of aspirin are:  Non-enteric-coated. This type of aspirin does not have a coating and is absorbed quickly. Non-enteric-coated aspirin is usually recommended for people with chest pain. This type of aspirin also comes in a chewable form.  Enteric-coated. This type of aspirin has a special coating that releases the medicine very slowly. Enteric-coated aspirin causes less stomach upset than non-enteric-coated aspirin. This type of aspirin should not be chewed or crushed.  Drink alcohol in moderation. Drinking alcohol increases your risk of bleeding. When should I seek medical care?  You have unusual bleeding or bruising.  You have stomach pain.  You have an allergic reaction. Symptoms of an allergic reaction include:  Hives.  Itchy skin.  Swelling of the lips, tongue, or face.  You have ringing in your ears. When should I seek immediate medical care?  Your bowel movements are bloody, dark red, or black in color.  You vomit or cough up blood.  You have blood in your urine.  You cough, wheeze, or feel short of breath. If you have any of the following symptoms, this is an emergency. Do not wait to see if the pain will go away. Get medical help at once. Call your  local emergency services (911 in the [place] Do not drive yourself to the hospital.  You have severe chest pain, especially if the pain is crushing or pressure-like and spreads to the arms, back, neck, or jaw.  You have stroke-like symptoms, such as:  Loss of vision.  Difficulty talking.  Numbness or weakness  on one side of your body.  Numbness or weakness in your arm or leg.  Not thinking clearly or feeling confused. This information is not intended to replace advice given to you by your health care provider. Make sure you discuss any questions you have with your health care provider. Document Released: 02/13/2008 Document Revised: 07/10/2015 Document Reviewed: 06/07/2013 Elsevier Interactive Patient Education  2017 [employer].

## 2016-07-04 NOTE — Care Management Note (Signed)
Case Management Note  Patient Details  Name: Jack [name] MRN: [id number] Date of Birth: September 30, 1970  Subjective/Objective:                  slurring of speech, facial droop and right-sided weakness Action/Plan: Discharge planning Expected Discharge Date:                  Expected Discharge Plan:  Home w Home Health Services  In-House Referral:     Discharge planning Services  CM Consult  Post Acute Care Choice:  Home Health Choice offered to:  Patient  DME Arranged:  3-N-1, Walker rolling DME Agency:  [facility]  HH Arranged:  PT, OT, Speech Therapy HH Agency:  [facility]  Status of Service:  Completed, signed off  If discussed at Long Length of Stay Meetings, dates discussed:    Additional Comments: CM spoke with pt for choice of home health agency and pt chooses [facility] for HHPT/OT/SLP.  CM has requested orders to be placed. Referral called to [facility] rep, Jermaine.  CM notified [facility] rep, Reggie to pleae deliver the rolling walker and 3n1 to room prior to discharge. No other Cm needs were communicated. Yves Dill, RN 07/04/2016, 3:40 PM

## 2016-07-04 NOTE — Progress Notes (Signed)
VASCULAR LAB PRELIMINARY  PRELIMINARY  PRELIMINARY  PRELIMINARY  Bilateral lower extremity venous duplex completed.    Preliminary report:  There is no DVT or SVT noted in the bilateral lower extremities.   Ahmani Daoud, RVT 07/04/2016, 4:28 PM

## 2016-07-04 NOTE — Consult Note (Signed)
[facility] Consult   Reason for Consult:  Agoraphobia, severe panic disorder Referring Physician: Dr. Leonie Man Patient Identification: GRIFFEY NICASIO MRN:  [id number] Principal Diagnosis: Agoraphobia with panic attacks Diagnosis:   Patient Active Problem List   Diagnosis Date Noted  . Agoraphobia with panic attacks [F40.01] 07/03/2016  . TIA (transient ischemic attack) [G45.9] 07/02/2016  . CVA (cerebral vascular accident) ([place]) [I63.9] 07/02/2016  . Hypertension [I10]   . Anxiety [F41.9]   . Gout [M10.9]     Total Time spent with patient: 1 hour  Subjective:   PERCIVAL GLASHEEN is a 46 y.o. male patient admitted with rightsided weakness, facial droop and slurred speech.  HPI:  Thanks for asking me to do a psychiatric consultation on Mr. Maher who reports history of Agoraphobia and Panic disorder dating back to years ago. He reports history of multiple medication trial which include Prozac, Zoloft, Paxil, Buspar and Xanax XR. He states that he stopped Prozac due to hives and was lethargic with other SSRI including Buspar. However, he reports favorable response to Xanax XR but says he requested to be weaned off many years ago due to fear of getting addicted to it. Patient no longer wants medications because he states "I was functioning without medication, Just wants treatment for my current situation.'' However, patient is opened to be referred to a counselor for the treatment of anxiety upon discharge. He denies depression, SI/HI, delusional thinking or psychosis.  Past Psychiatric History: as above  Risk to Self: Is patient at risk for suicide?: No Risk to Others:   Prior Inpatient Therapy:   Prior Outpatient Therapy:    Past Medical History:  Past Medical History:  Diagnosis Date  . Anxiety   . Gout   . Hypertension    History reviewed. No pertinent surgical history. Family History:  Family History  Problem Relation Age of Onset  . Adopted: Yes   Family  Psychiatric  History:  Social History:  History  Alcohol Use No     History  Drug Use No    Social History   Social History  . Marital status: Single    Spouse name: N/A  . Number of children: N/A  . Years of education: N/A   Social History Main Topics  . Smoking status: Never Smoker  . Smokeless tobacco: Never Used  . Alcohol use No  . Drug use: No  . Sexual activity: Not Asked   Other Topics Concern  . None   Social History Narrative  . None   Additional Social History:    Allergies:   Allergies  Allergen Reactions  . Levaquin [Levofloxacin In D5w] Itching and Swelling    Labs:  Results for orders placed or performed during the hospital encounter of 07/02/16 (from the past 48 hour(s))  Glucose, capillary     Status: Abnormal   Collection Time: 07/02/16  7:44 PM  Result Value Ref Range   Glucose-Capillary 105 (H) 65 - 99 mg/dL  Glucose, capillary     Status: Abnormal   Collection Time: 07/02/16 11:43 PM  Result Value Ref Range   Glucose-Capillary 106 (H) 65 - 99 mg/dL   Comment 1 Notify RN   CBC     Status: None   Collection Time: 07/03/16  3:11 AM  Result Value Ref Range   WBC 9.8 4.0 - 10.5 K/uL   RBC 5.02 4.22 - 5.81 MIL/uL   Hemoglobin 15.1 13.0 - 17.0 g/dL   HCT 45.2 39.0 - 52.0 %  MCV 90.0 78.0 - 100.0 fL   MCH 30.1 26.0 - 34.0 pg   MCHC 33.4 30.0 - 36.0 g/dL   RDW 13.7 11.5 - 15.5 %   Platelets 189 150 - 400 K/uL  Glucose, capillary     Status: Abnormal   Collection Time: 07/03/16  6:07 AM  Result Value Ref Range   Glucose-Capillary 145 (H) 65 - 99 mg/dL   Comment 1 Notify RN   Glucose, capillary     Status: Abnormal   Collection Time: 07/03/16  8:11 AM  Result Value Ref Range   Glucose-Capillary 156 (H) 65 - 99 mg/dL  Glucose, capillary     Status: Abnormal   Collection Time: 07/03/16 11:38 AM  Result Value Ref Range   Glucose-Capillary 133 (H) 65 - 99 mg/dL  Glucose, capillary     Status: Abnormal   Collection Time: 07/03/16  4:02  PM  Result Value Ref Range   Glucose-Capillary 140 (H) 65 - 99 mg/dL  Glucose, capillary     Status: Abnormal   Collection Time: 07/03/16  7:29 PM  Result Value Ref Range   Glucose-Capillary 116 (H) 65 - 99 mg/dL  C-reactive protein     Status: None   Collection Time: 07/04/16  4:44 AM  Result Value Ref Range   CRP <0.8 <1.0 mg/dL  Sedimentation rate     Status: None   Collection Time: 07/04/16  4:44 AM  Result Value Ref Range   Sed Rate 3 0 - 16 mm/hr  CBC     Status: None   Collection Time: 07/04/16  4:44 AM  Result Value Ref Range   WBC 7.9 4.0 - 10.5 K/uL   RBC 5.10 4.22 - 5.81 MIL/uL   Hemoglobin 15.6 13.0 - 17.0 g/dL   HCT 45.8 39.0 - 52.0 %   MCV 89.8 78.0 - 100.0 fL   MCH 30.6 26.0 - 34.0 pg   MCHC 34.1 30.0 - 36.0 g/dL   RDW 13.5 11.5 - 15.5 %   Platelets 176 150 - 400 K/uL  Basic metabolic panel     Status: Abnormal   Collection Time: 07/04/16  4:44 AM  Result Value Ref Range   Sodium 140 135 - 145 mmol/L   Potassium 3.6 3.5 - 5.1 mmol/L   Chloride 106 101 - 111 mmol/L   CO2 24 22 - 32 mmol/L   Glucose, Bld 118 (H) 65 - 99 mg/dL   BUN 10 6 - 20 mg/dL   Creatinine, Ser 0.84 0.61 - 1.24 mg/dL   Calcium 9.6 8.9 - 10.3 mg/dL   GFR calc non Af Amer >60 >60 mL/min   GFR calc Af Amer >60 >60 mL/min    Comment: (NOTE) The eGFR has been calculated using the CKD EPI equation. This calculation has not been validated in all clinical situations. eGFR's persistently <60 mL/min signify possible Chronic Kidney Disease.    Anion gap 10 5 - 15    Current Facility-Administered Medications  Medication Dose Route Frequency Provider Last Rate Last Dose  . acetaminophen (TYLENOL) tablet 650 mg  650 mg Oral Q4H PRN Sid Falcon, MD       Or  . acetaminophen (TYLENOL) solution 650 mg  650 mg Per Tube Q4H PRN Sid Falcon, MD       Or  . acetaminophen (TYLENOL) suppository 650 mg  650 mg Rectal Q4H PRN Sid Falcon, MD      . aspirin tablet 325 mg  325 mg Oral  Daily  Garvin Fila, MD   325 mg at 07/04/16 0741  . atorvastatin (LIPITOR) tablet 80 mg  80 mg Oral q1800 Garvin Fila, MD   80 mg at 07/03/16 1800  . cloNIDine (CATAPRES) tablet 0.1 mg  0.1 mg Oral BID Garvin Fila, MD   0.1 mg at 07/04/16 0741  . enoxaparin (LOVENOX) injection 40 mg  40 mg Subcutaneous Q24H Sid Falcon, MD   40 mg at 07/04/16 0743  . hydrALAZINE (APRESOLINE) injection 10 mg  10 mg Intravenous Q6H PRN Darrel Reach, MD   10 mg at 07/03/16 1834  . metoprolol tartrate (LOPRESSOR) tablet 25 mg  25 mg Oral BID Rosalin Hawking, MD      . nicardipine (CARDENE) 68m in 0.86% saline 2049mIV infusion (0.1 mg/ml)  0-15 mg/hr Intravenous Continuous ChWallie Char Stopped at 07/03/16 1235  . pantoprazole (PROTONIX) EC tablet 40 mg  40 mg Oral QAC supper [facility]    . senna-docusate (Senokot-S) tablet 1 tablet  1 tablet Oral QHS PRN EmSid FalconMD        Musculoskeletal: Strength & Muscle Tone: within normal but decreased on the right side Gait & Station: unsteady Patient leans: Right  Psychiatric Specialty Exam: Physical Exam  Psychiatric: His speech is normal and behavior is normal. Judgment and thought content normal. His mood appears anxious. Cognition and memory are normal.    Review of Systems  Constitutional: Negative.   HENT: Negative.   Cardiovascular: Negative.   Gastrointestinal: Negative.   Genitourinary: Negative.   Musculoskeletal: Negative.   Skin: Negative.   Neurological: Positive for focal weakness.  Endo/Heme/Allergies: Negative.   Psychiatric/Behavioral: Negative.     Blood pressure (!) 151/99, pulse 82, temperature 98 F (36.7 C), temperature source Oral, resp. rate 18, height _0  (1.93 m), weight 95.5 kg (210 lb 8 oz), SpO2 98 %.Body mass index is 25.62 kg/m.  General Appearance: Casual  Eye Contact:  Good  Speech:  Garbled  Volume:  Normal  Mood:  Anxious  Affect:  Appropriate  Thought Process:  Coherent and  Descriptions of Associations: Intact  Orientation:  Full (Time, Place, and Person)  Thought Content:  Logical  Suicidal Thoughts:  No  Homicidal Thoughts:  No  Memory:  Immediate;   Good Recent;   Good Remote;   Good  Judgement:  Intact  Insight:  Good  Psychomotor Activity:  Normal  Concentration:  Concentration: Fair and Attention Span: Good  Recall:  Good  Fund of Knowledge:  Good  Language:  Fair  Akathisia:  No  Handed:  Right  AIMS (if indicated):     Assets:  Communication Skills Desire for Improvement Social Support  ADL's:  Intact  Cognition:  WNL  Sleep:   good     Treatment Plan Summary: Plan: -Refer patient to a therapist/psychologist for counseling upon discharge -Patient declined medication management of Agoraphobia/Panic Disorder at this time.  Disposition: No evidence of imminent risk to self or others at present.   Patient does not meet criteria for psychiatric inpatient admission. Supportive therapy provided about ongoing stressors.  AkCorena PilgrimMD 07/04/2016 1:15 PM

## 2016-07-04 NOTE — Discharge Summary (Addendum)
Stroke Discharge Summary  Patient ID: Jack [name]   MRN: [id number]      DOB: 04-22-1970  Date of Admission: 07/02/2016 Date of Discharge: 07/04/2016  Attending Physician:  Garvin Fila, MD, Stroke MD Consultant(s):    psychiatry Dr Corena Pilgrim Patient's PCP:  No PCP Per Patient  DISCHARGE DIAGNOSIS:  Principal Problem:   presumed L brain infarct s/p IV tPA Active Problems:   HLD   Hypertension   Anxiety   Gout   Agoraphobia with panic attacks   BMI: Body mass index is 25.62 kg/m.  Past Medical History:  Diagnosis Date  . Anxiety   . Gout   . Hypertension    History reviewed. No pertinent surgical history.  Allergies as of 07/04/2016      Reactions   Levaquin [levofloxacin In D5w] Itching, Swelling      Medication List    STOP taking these medications   predniSONE 10 MG tablet Commonly known as:  DELTASONE     TAKE these medications   aspirin 325 MG tablet Take 1 tablet (325 mg total) by mouth daily. Start taking on:  07/05/2016   atorvastatin 80 MG tablet Commonly known as:  LIPITOR Take 1 tablet (80 mg total) by mouth daily at 6 PM.   cloNIDine 0.1 MG tablet Commonly known as:  CATAPRES Take 0.1 mg by mouth 2 (two) times daily.   famotidine 20 MG tablet Commonly known as:  PEPCID Take 1 tablet (20 mg total) by mouth 2 (two) times daily.   ibuprofen 200 MG tablet Commonly known as:  ADVIL,MOTRIN Take 400 mg by mouth every 6 (six) hours as needed for headache, mild pain or moderate pain.   metoprolol tartrate 25 MG tablet Commonly known as:  LOPRESSOR Take 1 tablet (25 mg total) by mouth 2 (two) times daily.            Durable Medical Equipment        Start     Ordered   07/04/16 1515  For home use only DME 3 n 1  Once     07/04/16 1514   07/04/16 1515  For home use only DME Walker rolling  Once    Question:  Patient needs a walker to treat with the following condition  Answer:  Stroke ([place])   07/04/16 1514       LABORATORY STUDIES CBC    Component Value Date/Time   WBC 7.9 07/04/2016 0444   RBC 5.10 07/04/2016 0444   HGB 15.6 07/04/2016 0444   HCT 45.8 07/04/2016 0444   PLT 176 07/04/2016 0444   MCV 89.8 07/04/2016 0444   MCH 30.6 07/04/2016 0444   MCHC 34.1 07/04/2016 0444   RDW 13.5 07/04/2016 0444   LYMPHSABS 4.1 (H) 07/02/2016 0101   MONOABS 0.7 07/02/2016 0101   EOSABS 0.3 07/02/2016 0101   BASOSABS 0.0 07/02/2016 0101   CMP    Component Value Date/Time   NA 140 07/04/2016 0444   K 3.6 07/04/2016 0444   CL 106 07/04/2016 0444   CO2 24 07/04/2016 0444   GLUCOSE 118 (H) 07/04/2016 0444   BUN 10 07/04/2016 0444   CREATININE 0.84 07/04/2016 0444   CALCIUM 9.6 07/04/2016 0444   PROT 7.5 07/02/2016 0655   ALBUMIN 4.3 07/02/2016 0655   AST 70 (H) 07/02/2016 0655   ALT 102 (H) 07/02/2016 0655   ALKPHOS 67 07/02/2016 0655   BILITOT 1.0 07/02/2016 0655   GFRNONAA >60 07/04/2016  0444   GFRAA >60 07/04/2016 0444   COAGS Lab Results  Component Value Date   INR 0.95 07/02/2016   Lipid Panel    Component Value Date/Time   CHOL 317 (H) 07/02/2016 0655   TRIG 219 (H) 07/02/2016 0655   HDL 34 (L) 07/02/2016 0655   CHOLHDL 9.3 07/02/2016 0655   VLDL 44 (H) 07/02/2016 0655   LDLCALC 239 (H) 07/02/2016 0655   HgbA1C  Lab Results  Component Value Date   HGBA1C 6.1 (H) 07/02/2016   Urinalysis    Component Value Date/Time   COLORURINE STRAW (A) 07/02/2016 0049   APPEARANCEUR CLEAR 07/02/2016 0049   LABSPEC 1.004 (L) 07/02/2016 0049   PHURINE 7.0 07/02/2016 0049   GLUCOSEU NEGATIVE 07/02/2016 0049   HGBUR NEGATIVE 07/02/2016 0049   BILIRUBINUR NEGATIVE 07/02/2016 0049   [place] 07/02/2016 0049   PROTEINUR NEGATIVE 07/02/2016 0049   NITRITE NEGATIVE 07/02/2016 0049   LEUKOCYTESUR NEGATIVE 07/02/2016 0049   Urine Drug Screen     Component Value Date/Time   LABOPIA NONE DETECTED 07/02/2016 0049   COCAINSCRNUR NONE DETECTED 07/02/2016 0049   LABBENZ NONE  DETECTED 07/02/2016 0049   AMPHETMU NONE DETECTED 07/02/2016 0049   THCU NONE DETECTED 07/02/2016 0049   LABBARB NONE DETECTED 07/02/2016 0049    Alcohol Level    Component Value Date/Time   ETH <5 07/02/2016 0101     SIGNIFICANT DIAGNOSTIC STUDIES I have personally reviewed the radiological images below and agree with the radiology interpretations.  PORTABLE CHEST 1 VIEW 07/03/2016 1. Minimal left basilar airspace disease likely reflects atelectasis. 2. Low lung volumes.  CT HEAD WITHOUT CONTRAST 07/03/2016 Negative CT head. Negative for hemorrhage post tPA   CT ANGIOGRAPHY HEAD AND NECK 07/02/2016 1. Negative CTA of the head and neck. No emergent large vessel occlusion. No high-grade or flow-limiting stenosis. 2. Mild atheromatous plaque within the carotid siphons without significant stenosis. 3. Emphysema.   Transthoracic Echocardiography 07/02/2016 Study Conclusions - Left ventricle: The cavity size was normal. Wall thickness was   increased in a pattern of moderate to severe LVH. Systolic   function was vigorous. The estimated ejection fraction was in the   range of 65% to 70%. Wall motion was normal; there were no   regional wall motion abnormalities. Doppler parameters are   consistent with abnormal left ventricular relaxation (grade 1   diastolic dysfunction). - Pericardium, extracardiac: A trivial pericardial effusion was   identified circumferential to the heart. Impressions: - No cardiac source of emboli was indentified.   Lower Extremity Venous Dopplers 07/04/2016 Preliminary Report - There is no DVT or SVT noted in the bilateral lower extremities.       HISTORY OF PRESENT ILLNESS Jack [name] is an 46 y.o. male history of hypertension and anxiety presenting with complaints of blurred vision as well as slurred speech, intermittent visual droop and right sided weakness. Initial episode of weakness occurred at about 9 PM tonight and was  subsequently cleared. He had recurrence of weakness at 11:51 PM, which has improved but not completely resolved. He has not been on antiplatelet therapy and his blood pressure was markedly elevated and patient is currently on Cardene drip, having failed treatment with labetalol IV. NIH stroke score was 4.  LSN: 11:51 PM on 07/02/1998 8T tPA Given: No: Deficits rapidly resolving mRankin:  HOSPITAL COURSE Mr. DORIS MCGILVERY is a 46 y.o. male with history of HTN, anxiety, agoraphobia and panic disorder presenting with intermittent slurring of speech, facial  droop and right-sided weakness. He received IV t-PA on 07/02/2016 at [place].   Stroke:   Likely L brain infarct without ELVO s/p IV tPA, source workup underway    Ct head x 2 negative and patient refusing MRI  Resultant  R hemiparesis, right facial droop  Code Stroke CT no acute stroke. Aspects 10.    CTA head & neck no ELVO. Mild carotid plaque w/o stenosis. Emphysema.  24h post tPA CT no hmg post tPA and no stroke  MRI - not performed - patient declined  MRA - not performed - patient declined  2D Echo  EF 65-70%. No source of embolus   LE venous doppler no DVT  Consider outpt TCD bubble study to rule out PFO  LDL 239  HgbA1c 6.1 - outpt follow up with PCP  HIV NR, ESR and CRP negative  Hypercoagulable work up pending  Lovenox for VTE prophylaxis   Diet Heart Room service appropriate? Yes; Fluid consistency: Thin  No antithrombotic prior to admission, now on aspirin 325 mg daily   Therapy recommendations:   Inpatient rehabilitation was recommended however the patient declined.  Disposition: Discharged to home with home health therapies. Nurse to check blood pressure.  Hypertensive Emergency  BP - 151/99 - now on catapres and lopressor ( could increase lopressor or catapres if needed)  off Cardene.   Stable currently on home dose of Catapres along with metoprolol.  Eventual goal will be normotensive.  Close  follow up with PCP  Hyperlipidemia  LDL 239, goal LDL < 70   No statin PTA  Added lipitor 80 mg daily   Continue statin on discharge  Other Stroke Risk Factors  UDS / ETOH level negative   Agoraphobia / anxiety / panic disorder  Psychiatric consult obtained from Dr Darleene Cleaver:  Treatment Plan Summary: -Refer patient to a therapist/psychologist for counseling upon discharge -Patient declined medication management of Agoraphobia/Panic Disorder at this time. Disposition: No evidence of imminent risk to self or others at present.   Patient does not meet criteria for psychiatric inpatient admission. Supportive therapy provided about ongoing stressors.   DISCHARGE EXAM Blood pressure (!) 155/101, pulse 92, temperature 97.5 F (36.4 C), temperature source Oral, resp. rate 18, height _0  (1.93 m), weight 95.5 kg (210 lb 8 oz), SpO2 98 %. Pleasant middle aged Caucasian male not in distress. Afebrile. Head is nontraumatic. Neck is supple without bruit.    Cardiac exam no murmur or gallop. Lungs are clear to auscultation. Distal pulses are well felt. Neurological Exam :  Awake alert oriented 3. Mild to moderate dysarthria but can be understood. Follows commands well. Extraocular moments are full range without nystagmus. Fundi were not visualized. Vision acuity and fields seem adequate. Right lower facial weakness. Tongue midline. Motor system exam shows mild right hemiparesis 4/5 strength with weakness of right grip and intrinsic hand muscles. Diminished fine finger movements on the right. Orbits left over right upper extremity. Sensation intact bilaterally. Deep tendon reflexes are symmetric. Plantars are downgoing. Gait was not tested.   Discharge Diet   Diet Heart Room service appropriate? Yes; Fluid consistency: Thin liquids   PATIENT DISCHARGE INSTRUCTIONS 1. Follow up with your primary care provider in 2 weeks to have your blood pressure checked etc. 2. If you do not have a  primary care provider please make arrangements to become established with one as soon as possible. 3. Gradually increase activity as tolerated. 4. Home health physical, occupational, and speech therapies will be scheduled.  5. Follow up with a psychologist or therapist as instructed. If a recommendation has not been made please call the psychologist of your choice or call Dr Vevelyn Pat Akintayo's office for a recommendation, or try [facility]  for an appointment.  6. Home nurse will check blood pressure, monitor medications, and assist with obtaining medical follow up appointments. 7. You will need to have your liver enzymes checked at your next office visit. 8. Avoid sugar and sweets. Blood sugar is too high.    DISCHARGE PLAN  Disposition:  Discharged to home  aspirin 325 mg daily for secondary stroke prevention.  Ongoing risk factor control by Primary Care Physician at time of discharge  Follow-up No PCP Per Patient in 2 weeks.  Follow-up with Dr. Antony Contras, Stroke Clinic in 6 weeks, office to schedule an appointment.  40 minutes were spent preparing discharge.  Rosalin Hawking, MD PhD Stroke Neurology 07/04/2016 5:05 PM

## 2016-07-04 NOTE — Progress Notes (Signed)
Physical Therapy Treatment Patient Details Name: Jack [name] MRN: [id number] DOB: June 29, 1970 Today's Date: 07/04/2016    History of Present Illness Patient is a 45 yo male admitted 07/02/16 with Rt sided facial droop, dysarthria, RUE/RLE weakness.  Patient given tPA at 07/02/16 - 04:51.     PMH:  anxiety, gout, HTN    PT Comments    Patient is making improvements with mobility and gait.  However, patient continues to require min-mod assist for gait/mobility.  Patient is high fall risk.  Continue to recommend Inpatient Rehab stay prior to return home.  Spoke with patient and parents regarding benefits of inpatient rehab.  Patient continued to decline.  Patient chooses to go home with [facility] services.  Provided detailed instruction to parents on how to assist patient with gait/mobility at home.  Recommended use of safety belt during gait.  Patient will need HHPT (and HHOT/SLP), RW, and 3-in-1 BSC.    Follow Up Recommendations  CIR;Supervision/Assistance - 24 hour;   Patient declining CIR, so will need HHPT (contacted CM-Jack [name] and provided d/c therapy needs).     Equipment Recommendations  Rolling walker with 5" wheels;3in1 (PT)    Recommendations for Other Services       Precautions / Restrictions Precautions Precautions: Fall Precaution Comments: Discussed with patient and parents that patient needs hands on assistance any time he is up walking. Restrictions Weight Bearing Restrictions: No    Mobility  Bed Mobility Overal bed mobility: Needs Assistance Bed Mobility: Supine to Sit     Supine to sit: Min guard     General bed mobility comments: Patient in chair  Transfers Overall transfer level: Needs assistance Equipment used: Rolling walker (2 wheeled) Transfers: Sit to/from Stand Sit to Stand: Min assist Stand pivot transfers: Min assist       General transfer comment: verbal cues for hand placement.  Assist with RUE to place on armrest.  Assist for balance during  transfer.  Assist to bring RUE onto RW.  Patient able to grip RW with Rt hand.  Ambulation/Gait Ambulation/Gait assistance: Mod assist;Min assist Ambulation Distance (Feet): 58 Feet Assistive device: Rolling walker (2 wheeled) Gait Pattern/deviations: Step-through pattern;Step-to pattern;Decreased stance time - right;Decreased step length - left;Decreased step length - right;Decreased stride length;Decreased dorsiflexion - right;Shuffle;Drifts right/left;Trunk flexed Gait velocity: decreased Gait velocity interpretation: Below normal speed for age/gender General Gait Details: Patient with decreased DF on Rt causing foot to slide during step phase rather than heel-toe pattern.  Patient with decreased knee stability on Rt during stance phase.  Patient's Rt foot lags behind due to decreased step length, and patient loses balance.  Instructed patient to stop, regain balance, and start again when this happens.  Unstable gait even with RW.  Instructed patient to have someone with him at all times during gait. Do not ambulate on his own due to high fall risk.   Stairs            Wheelchair Mobility    Modified Rankin (Stroke Patients Only) Modified Rankin (Stroke Patients Only) Pre-Morbid Rankin Score: No symptoms Modified Rankin: Moderately severe disability     Balance Overall balance assessment: Needs assistance Sitting-balance support: Feet supported;No upper extremity supported Sitting balance-Leahy Scale: Good     Standing balance support: Bilateral upper extremity supported Standing balance-Leahy Scale: Fair Standing balance comment: Patient able to stand statically.  Requires UE support for dynamic activities/gait.  Cognition Arousal/Alertness: Awake/alert Behavior During Therapy: WFL for tasks assessed/performed Overall Cognitive Status: Within Functional Limits for tasks assessed                                         Exercises      General Comments        Pertinent Vitals/Pain Pain Assessment: No/denies pain    Home Living Family/patient expects to be discharged to:: Private residence Living Arrangements: Spouse/significant other;Parent Available Help at Discharge: Family;Available 24 hours/day Type of Home: House Home Access: Stairs to enter Entrance Stairs-Rails: Right Home Layout: Two level;Able to live on main level with bedroom/bathroom Home Equipment: Gilmer Mor - single point      Prior Function Level of Independence: Independent      Comments: Works in Therapist, sports, 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   PT Goals (current goals can now be found in the care plan section) Acute Rehab PT Goals Patient Stated Goal: get back home Progress towards PT goals: Progressing toward goals    Frequency    Min 4X/week      PT Plan Current plan remains appropriate;Equipment recommendations need to be updated    Co-evaluation             End of Session Equipment Utilized During Treatment: Gait belt Activity Tolerance: Patient tolerated treatment well;Patient limited by fatigue Patient left: in chair;with call bell/phone within reach;with family/visitor present Nurse Communication: Mobility status (Declined CIR. Needs for d/c home.) PT Visit Diagnosis: Unsteadiness on feet (R26.81);Other abnormalities of gait and mobility (R26.89);Hemiplegia and hemiparesis;Other symptoms and signs involving the nervous system (R29.898) Hemiplegia - Right/Left: Right Hemiplegia - dominant/non-dominant: Dominant Hemiplegia - caused by: Cerebral infarction     Time: 1610-9604 PT Time Calculation (min) (ACUTE ONLY): 17 min  Charges:  $Gait Training: 8-22 mins                    G Codes:       Durenda Hurt. Renaldo Fiddler, [facility] Acute Rehab Services Pager [phone]    Vena Austria 07/04/2016, 2:11 PM

## 2016-07-05 LAB — LUPUS ANTICOAGULANT PANEL
DRVVT: 45.5 s (ref 0.0–47.0)
PTT LA: 37.4 s (ref 0.0–51.9)

## 2016-07-06 LAB — CARDIOLIPIN ANTIBODIES, IGG, IGM, IGA
Anticardiolipin IgA: <9 APL U/mL (ref 0–11)
Anticardiolipin IgM: <9 MPL U/mL (ref 0–12)

## 2016-07-06 LAB — ANTI-DNA ANTIBODY, DOUBLE-STRANDED

## 2016-07-06 LAB — HOMOCYSTEINE: HOMOCYSTEINE-NORM: 9.5 umol/L (ref 0.0–15.0)

## 2016-07-06 LAB — ANTINUCLEAR ANTIBODIES, IFA: ANA Ab, IFA: NEGATIVE

## 2016-07-07 ENCOUNTER — Telehealth: Payer: Self-pay | Admitting: Neurology

## 2016-07-07 DIAGNOSIS — I161 Hypertensive emergency: Secondary | ICD-10-CM | POA: Diagnosis not present

## 2016-07-07 DIAGNOSIS — I69328 Other speech and language deficits following cerebral infarction: Secondary | ICD-10-CM | POA: Diagnosis not present

## 2016-07-07 DIAGNOSIS — Z7982 Long term (current) use of aspirin: Secondary | ICD-10-CM | POA: Diagnosis not present

## 2016-07-07 DIAGNOSIS — E785 Hyperlipidemia, unspecified: Secondary | ICD-10-CM | POA: Diagnosis not present

## 2016-07-07 DIAGNOSIS — I69392 Facial weakness following cerebral infarction: Secondary | ICD-10-CM | POA: Diagnosis not present

## 2016-07-07 DIAGNOSIS — I10 Essential (primary) hypertension: Secondary | ICD-10-CM | POA: Diagnosis not present

## 2016-07-07 DIAGNOSIS — M109 Gout, unspecified: Secondary | ICD-10-CM | POA: Diagnosis not present

## 2016-07-07 DIAGNOSIS — I69351 Hemiplegia and hemiparesis following cerebral infarction affecting right dominant side: Secondary | ICD-10-CM | POA: Diagnosis not present

## 2016-07-07 DIAGNOSIS — F4001 Agoraphobia with panic disorder: Secondary | ICD-10-CM | POA: Diagnosis not present

## 2016-07-07 NOTE — Telephone Encounter (Addendum)
Rn call [facility] from [facility]. Rn stated per discharge note pt will follow up with Dr. Pearlean Brownie once he schedules an appointment. Dr.Sethi can sign off the orders. Holly verbalized understanding.

## 2016-07-07 NOTE — Telephone Encounter (Signed)
Holly with [facility] called office to state she did a home evaluation with patient this morning and would like to know if Dr. Pearlean Brownie will be signing off on home health orders.  Please call [phone]

## 2016-07-08 ENCOUNTER — Telehealth: Payer: Self-pay

## 2016-07-08 ENCOUNTER — Telehealth: Payer: Self-pay | Admitting: Neurology

## 2016-07-08 DIAGNOSIS — Z7982 Long term (current) use of aspirin: Secondary | ICD-10-CM | POA: Diagnosis not present

## 2016-07-08 DIAGNOSIS — F4001 Agoraphobia with panic disorder: Secondary | ICD-10-CM | POA: Diagnosis not present

## 2016-07-08 DIAGNOSIS — I69328 Other speech and language deficits following cerebral infarction: Secondary | ICD-10-CM | POA: Diagnosis not present

## 2016-07-08 DIAGNOSIS — E785 Hyperlipidemia, unspecified: Secondary | ICD-10-CM | POA: Diagnosis not present

## 2016-07-08 DIAGNOSIS — I161 Hypertensive emergency: Secondary | ICD-10-CM | POA: Diagnosis not present

## 2016-07-08 DIAGNOSIS — M109 Gout, unspecified: Secondary | ICD-10-CM | POA: Diagnosis not present

## 2016-07-08 DIAGNOSIS — I10 Essential (primary) hypertension: Secondary | ICD-10-CM | POA: Diagnosis not present

## 2016-07-08 DIAGNOSIS — I69351 Hemiplegia and hemiparesis following cerebral infarction affecting right dominant side: Secondary | ICD-10-CM | POA: Diagnosis not present

## 2016-07-08 DIAGNOSIS — I69392 Facial weakness following cerebral infarction: Secondary | ICD-10-CM | POA: Diagnosis not present

## 2016-07-08 LAB — BETA-2-GLYCOPROTEIN I ABS, IGG/M/A: Beta-2 Glyco I IgG: <9 GPI IgG units (ref 0–20)

## 2016-07-08 NOTE — Telephone Encounter (Signed)
Rn call patient that lab work for lupus, homocysteine, and anticardiolipin antibodies were normal. Pt verbalized understanding.

## 2016-07-08 NOTE — Telephone Encounter (Signed)
Rn call [employer] per Dr. Pearlean Brownie OT can be approve for 7 days. [name] verbalized understanding.

## 2016-07-08 NOTE — Telephone Encounter (Signed)
Jack [name]  OT with [facility] called for need of approval for plan of care 7 visits he is asking to be called at [phone]

## 2016-07-08 NOTE — Telephone Encounter (Signed)
-----   Message from Micki Riley, MD sent at 07/07/2016  8:37 PM EDT ----- Jack [name] inform patient that lab work for lupus, homocysteine and anticardiolipin antibodies was all normal

## 2016-07-08 NOTE — Telephone Encounter (Signed)
Rn call patient that the lupus anticoagulant study was normal, lower extremity venous dopplers did not show any clots in legs. Pt verbalized understanding.

## 2016-07-08 NOTE — Telephone Encounter (Signed)
Boneta Lucks with [facility] called office requesting orders for speech therapy.  Requesting 2 times for next week and 1 time for the following week.  Please call Boneta Lucks at [phone]

## 2016-07-08 NOTE — Telephone Encounter (Signed)
-----   Message from Micki Riley, MD sent at 07/06/2016 10:15 AM EDT ----- Jack [name] inform the patient that lupus anticoagulant study was negative and lower extremity venous Dopplers also did not show any evidence of clot in the legs

## 2016-07-08 NOTE — Telephone Encounter (Signed)
Rn call Boneta Lucks that per Dr.SEthi orders for speech can continue as stated. Boneta Lucks verbalized understanding.

## 2016-07-10 DIAGNOSIS — Z7982 Long term (current) use of aspirin: Secondary | ICD-10-CM | POA: Diagnosis not present

## 2016-07-10 DIAGNOSIS — I69328 Other speech and language deficits following cerebral infarction: Secondary | ICD-10-CM | POA: Diagnosis not present

## 2016-07-10 DIAGNOSIS — E785 Hyperlipidemia, unspecified: Secondary | ICD-10-CM | POA: Diagnosis not present

## 2016-07-10 DIAGNOSIS — M109 Gout, unspecified: Secondary | ICD-10-CM | POA: Diagnosis not present

## 2016-07-10 DIAGNOSIS — I69351 Hemiplegia and hemiparesis following cerebral infarction affecting right dominant side: Secondary | ICD-10-CM | POA: Diagnosis not present

## 2016-07-10 DIAGNOSIS — I69392 Facial weakness following cerebral infarction: Secondary | ICD-10-CM | POA: Diagnosis not present

## 2016-07-10 DIAGNOSIS — F4001 Agoraphobia with panic disorder: Secondary | ICD-10-CM | POA: Diagnosis not present

## 2016-07-10 DIAGNOSIS — I161 Hypertensive emergency: Secondary | ICD-10-CM | POA: Diagnosis not present

## 2016-07-10 DIAGNOSIS — I10 Essential (primary) hypertension: Secondary | ICD-10-CM | POA: Diagnosis not present

## 2016-07-11 DIAGNOSIS — F4001 Agoraphobia with panic disorder: Secondary | ICD-10-CM | POA: Diagnosis not present

## 2016-07-11 DIAGNOSIS — I69351 Hemiplegia and hemiparesis following cerebral infarction affecting right dominant side: Secondary | ICD-10-CM | POA: Diagnosis not present

## 2016-07-11 DIAGNOSIS — Z7982 Long term (current) use of aspirin: Secondary | ICD-10-CM | POA: Diagnosis not present

## 2016-07-11 DIAGNOSIS — I161 Hypertensive emergency: Secondary | ICD-10-CM | POA: Diagnosis not present

## 2016-07-11 DIAGNOSIS — E785 Hyperlipidemia, unspecified: Secondary | ICD-10-CM | POA: Diagnosis not present

## 2016-07-11 DIAGNOSIS — I69392 Facial weakness following cerebral infarction: Secondary | ICD-10-CM | POA: Diagnosis not present

## 2016-07-11 DIAGNOSIS — I10 Essential (primary) hypertension: Secondary | ICD-10-CM | POA: Diagnosis not present

## 2016-07-11 DIAGNOSIS — M109 Gout, unspecified: Secondary | ICD-10-CM | POA: Diagnosis not present

## 2016-07-11 DIAGNOSIS — I69328 Other speech and language deficits following cerebral infarction: Secondary | ICD-10-CM | POA: Diagnosis not present

## 2016-07-13 DIAGNOSIS — M109 Gout, unspecified: Secondary | ICD-10-CM | POA: Diagnosis not present

## 2016-07-13 DIAGNOSIS — I10 Essential (primary) hypertension: Secondary | ICD-10-CM | POA: Diagnosis not present

## 2016-07-13 DIAGNOSIS — I69392 Facial weakness following cerebral infarction: Secondary | ICD-10-CM | POA: Diagnosis not present

## 2016-07-13 DIAGNOSIS — Z7982 Long term (current) use of aspirin: Secondary | ICD-10-CM | POA: Diagnosis not present

## 2016-07-13 DIAGNOSIS — F4001 Agoraphobia with panic disorder: Secondary | ICD-10-CM | POA: Diagnosis not present

## 2016-07-13 DIAGNOSIS — I161 Hypertensive emergency: Secondary | ICD-10-CM | POA: Diagnosis not present

## 2016-07-13 DIAGNOSIS — E785 Hyperlipidemia, unspecified: Secondary | ICD-10-CM | POA: Diagnosis not present

## 2016-07-13 DIAGNOSIS — I69351 Hemiplegia and hemiparesis following cerebral infarction affecting right dominant side: Secondary | ICD-10-CM | POA: Diagnosis not present

## 2016-07-13 DIAGNOSIS — I69328 Other speech and language deficits following cerebral infarction: Secondary | ICD-10-CM | POA: Diagnosis not present

## 2016-07-14 DIAGNOSIS — Z7982 Long term (current) use of aspirin: Secondary | ICD-10-CM | POA: Diagnosis not present

## 2016-07-14 DIAGNOSIS — I10 Essential (primary) hypertension: Secondary | ICD-10-CM | POA: Diagnosis not present

## 2016-07-14 DIAGNOSIS — E785 Hyperlipidemia, unspecified: Secondary | ICD-10-CM | POA: Diagnosis not present

## 2016-07-14 DIAGNOSIS — M109 Gout, unspecified: Secondary | ICD-10-CM | POA: Diagnosis not present

## 2016-07-14 DIAGNOSIS — F4001 Agoraphobia with panic disorder: Secondary | ICD-10-CM | POA: Diagnosis not present

## 2016-07-14 DIAGNOSIS — I161 Hypertensive emergency: Secondary | ICD-10-CM | POA: Diagnosis not present

## 2016-07-14 DIAGNOSIS — I69351 Hemiplegia and hemiparesis following cerebral infarction affecting right dominant side: Secondary | ICD-10-CM | POA: Diagnosis not present

## 2016-07-14 DIAGNOSIS — I69392 Facial weakness following cerebral infarction: Secondary | ICD-10-CM | POA: Diagnosis not present

## 2016-07-14 DIAGNOSIS — I69328 Other speech and language deficits following cerebral infarction: Secondary | ICD-10-CM | POA: Diagnosis not present

## 2016-07-15 DIAGNOSIS — I10 Essential (primary) hypertension: Secondary | ICD-10-CM | POA: Diagnosis not present

## 2016-07-15 DIAGNOSIS — Z7982 Long term (current) use of aspirin: Secondary | ICD-10-CM | POA: Diagnosis not present

## 2016-07-15 DIAGNOSIS — I69392 Facial weakness following cerebral infarction: Secondary | ICD-10-CM | POA: Diagnosis not present

## 2016-07-15 DIAGNOSIS — I69351 Hemiplegia and hemiparesis following cerebral infarction affecting right dominant side: Secondary | ICD-10-CM | POA: Diagnosis not present

## 2016-07-15 DIAGNOSIS — F4001 Agoraphobia with panic disorder: Secondary | ICD-10-CM | POA: Diagnosis not present

## 2016-07-15 DIAGNOSIS — M109 Gout, unspecified: Secondary | ICD-10-CM | POA: Diagnosis not present

## 2016-07-15 DIAGNOSIS — I69328 Other speech and language deficits following cerebral infarction: Secondary | ICD-10-CM | POA: Diagnosis not present

## 2016-07-15 DIAGNOSIS — E785 Hyperlipidemia, unspecified: Secondary | ICD-10-CM | POA: Diagnosis not present

## 2016-07-15 DIAGNOSIS — I161 Hypertensive emergency: Secondary | ICD-10-CM | POA: Diagnosis not present

## 2016-07-17 DIAGNOSIS — I69351 Hemiplegia and hemiparesis following cerebral infarction affecting right dominant side: Secondary | ICD-10-CM | POA: Diagnosis not present

## 2016-07-17 DIAGNOSIS — F4001 Agoraphobia with panic disorder: Secondary | ICD-10-CM | POA: Diagnosis not present

## 2016-07-17 DIAGNOSIS — I161 Hypertensive emergency: Secondary | ICD-10-CM | POA: Diagnosis not present

## 2016-07-17 DIAGNOSIS — Z7982 Long term (current) use of aspirin: Secondary | ICD-10-CM | POA: Diagnosis not present

## 2016-07-17 DIAGNOSIS — I10 Essential (primary) hypertension: Secondary | ICD-10-CM | POA: Diagnosis not present

## 2016-07-17 DIAGNOSIS — I69392 Facial weakness following cerebral infarction: Secondary | ICD-10-CM | POA: Diagnosis not present

## 2016-07-17 DIAGNOSIS — M109 Gout, unspecified: Secondary | ICD-10-CM | POA: Diagnosis not present

## 2016-07-17 DIAGNOSIS — I69328 Other speech and language deficits following cerebral infarction: Secondary | ICD-10-CM | POA: Diagnosis not present

## 2016-07-17 DIAGNOSIS — E785 Hyperlipidemia, unspecified: Secondary | ICD-10-CM | POA: Diagnosis not present

## 2016-07-20 DIAGNOSIS — Z7982 Long term (current) use of aspirin: Secondary | ICD-10-CM | POA: Diagnosis not present

## 2016-07-20 DIAGNOSIS — I69392 Facial weakness following cerebral infarction: Secondary | ICD-10-CM | POA: Diagnosis not present

## 2016-07-20 DIAGNOSIS — I69328 Other speech and language deficits following cerebral infarction: Secondary | ICD-10-CM | POA: Diagnosis not present

## 2016-07-20 DIAGNOSIS — I69351 Hemiplegia and hemiparesis following cerebral infarction affecting right dominant side: Secondary | ICD-10-CM | POA: Diagnosis not present

## 2016-07-20 DIAGNOSIS — I10 Essential (primary) hypertension: Secondary | ICD-10-CM | POA: Diagnosis not present

## 2016-07-20 DIAGNOSIS — M109 Gout, unspecified: Secondary | ICD-10-CM | POA: Diagnosis not present

## 2016-07-20 DIAGNOSIS — I161 Hypertensive emergency: Secondary | ICD-10-CM | POA: Diagnosis not present

## 2016-07-20 DIAGNOSIS — F4001 Agoraphobia with panic disorder: Secondary | ICD-10-CM | POA: Diagnosis not present

## 2016-07-20 DIAGNOSIS — E785 Hyperlipidemia, unspecified: Secondary | ICD-10-CM | POA: Diagnosis not present

## 2016-07-20 LAB — ALPHA GALACTOSIDASE: ALPHA GALACTOSIDASE, SERUM: 37.6 nmol/h/mg{prot} (ref 28.0–80.0)

## 2016-07-22 DIAGNOSIS — I161 Hypertensive emergency: Secondary | ICD-10-CM | POA: Diagnosis not present

## 2016-07-22 DIAGNOSIS — Z7982 Long term (current) use of aspirin: Secondary | ICD-10-CM | POA: Diagnosis not present

## 2016-07-22 DIAGNOSIS — I69351 Hemiplegia and hemiparesis following cerebral infarction affecting right dominant side: Secondary | ICD-10-CM | POA: Diagnosis not present

## 2016-07-22 DIAGNOSIS — I10 Essential (primary) hypertension: Secondary | ICD-10-CM | POA: Diagnosis not present

## 2016-07-22 DIAGNOSIS — I69328 Other speech and language deficits following cerebral infarction: Secondary | ICD-10-CM | POA: Diagnosis not present

## 2016-07-22 DIAGNOSIS — F4001 Agoraphobia with panic disorder: Secondary | ICD-10-CM | POA: Diagnosis not present

## 2016-07-22 DIAGNOSIS — I69392 Facial weakness following cerebral infarction: Secondary | ICD-10-CM | POA: Diagnosis not present

## 2016-07-22 DIAGNOSIS — E785 Hyperlipidemia, unspecified: Secondary | ICD-10-CM | POA: Diagnosis not present

## 2016-07-22 DIAGNOSIS — M109 Gout, unspecified: Secondary | ICD-10-CM | POA: Diagnosis not present

## 2016-07-27 DIAGNOSIS — I69328 Other speech and language deficits following cerebral infarction: Secondary | ICD-10-CM | POA: Diagnosis not present

## 2016-07-27 DIAGNOSIS — F4001 Agoraphobia with panic disorder: Secondary | ICD-10-CM | POA: Diagnosis not present

## 2016-07-27 DIAGNOSIS — I10 Essential (primary) hypertension: Secondary | ICD-10-CM | POA: Diagnosis not present

## 2016-07-27 DIAGNOSIS — I161 Hypertensive emergency: Secondary | ICD-10-CM | POA: Diagnosis not present

## 2016-07-27 DIAGNOSIS — Z7982 Long term (current) use of aspirin: Secondary | ICD-10-CM | POA: Diagnosis not present

## 2016-07-27 DIAGNOSIS — M109 Gout, unspecified: Secondary | ICD-10-CM | POA: Diagnosis not present

## 2016-07-27 DIAGNOSIS — I69392 Facial weakness following cerebral infarction: Secondary | ICD-10-CM | POA: Diagnosis not present

## 2016-07-27 DIAGNOSIS — I69351 Hemiplegia and hemiparesis following cerebral infarction affecting right dominant side: Secondary | ICD-10-CM | POA: Diagnosis not present

## 2016-07-27 DIAGNOSIS — E785 Hyperlipidemia, unspecified: Secondary | ICD-10-CM | POA: Diagnosis not present

## 2016-07-28 ENCOUNTER — Telehealth: Payer: Self-pay

## 2016-07-28 NOTE — Telephone Encounter (Signed)
-----   Message from Micki RileyPramod S Sethi, MD sent at 07/27/2016  6:43 PM EDT ----- Jack RoachKindly inform the patient that blood test for Fabry`s disease was negative

## 2016-07-28 NOTE — Telephone Encounter (Signed)
Left vm for patient to call back about lab work. 

## 2016-07-29 NOTE — Telephone Encounter (Signed)
Pt returned RN's call, msg relayed, he understood with no questions

## 2016-07-30 ENCOUNTER — Other Ambulatory Visit: Payer: Self-pay | Admitting: Neurology

## 2016-07-30 ENCOUNTER — Telehealth: Payer: Self-pay | Admitting: Neurology

## 2016-07-30 NOTE — Telephone Encounter (Signed)
Patient called office in reference to BP running high and requesting an increase on medication.  Advised patient he does not have an appointment with us until 08/13/16 for a stroke/hospital fu and he would need to contact his PCP.  Patient stated he does not currently have a PCP I then advised him to go to Urgent Care after speaking with RN per patient they do not handle BP.  I then advised patient to go to ED with him stating BP was running high he said it is $500 I talked with patient and asked him if he has contacted PCP offices since discharge to be established.  Patient returned no he did not know he needed to patient stated he was going to call around and see if he is able to get an appointment ASAP with a PCP office.

## 2016-07-31 DIAGNOSIS — Z7689 Persons encountering health services in other specified circumstances: Secondary | ICD-10-CM | POA: Diagnosis not present

## 2016-07-31 DIAGNOSIS — I639 Cerebral infarction, unspecified: Secondary | ICD-10-CM | POA: Diagnosis not present

## 2016-07-31 DIAGNOSIS — E663 Overweight: Secondary | ICD-10-CM | POA: Diagnosis not present

## 2016-07-31 DIAGNOSIS — Z1389 Encounter for screening for other disorder: Secondary | ICD-10-CM | POA: Diagnosis not present

## 2016-07-31 DIAGNOSIS — I10 Essential (primary) hypertension: Secondary | ICD-10-CM | POA: Diagnosis not present

## 2016-08-13 ENCOUNTER — Ambulatory Visit (INDEPENDENT_AMBULATORY_CARE_PROVIDER_SITE_OTHER): Payer: BLUE CROSS/BLUE SHIELD | Admitting: Neurology

## 2016-08-13 ENCOUNTER — Encounter (INDEPENDENT_AMBULATORY_CARE_PROVIDER_SITE_OTHER): Payer: Self-pay

## 2016-08-13 ENCOUNTER — Encounter: Payer: Self-pay | Admitting: Neurology

## 2016-08-13 VITALS — BP 132/84 | HR 60 | Ht 72.0 in | Wt 199.8 lb

## 2016-08-13 DIAGNOSIS — R200 Anesthesia of skin: Secondary | ICD-10-CM | POA: Diagnosis not present

## 2016-08-13 DIAGNOSIS — I69351 Hemiplegia and hemiparesis following cerebral infarction affecting right dominant side: Secondary | ICD-10-CM

## 2016-08-13 DIAGNOSIS — I639 Cerebral infarction, unspecified: Secondary | ICD-10-CM | POA: Diagnosis not present

## 2016-08-13 DIAGNOSIS — R471 Dysarthria and anarthria: Secondary | ICD-10-CM | POA: Diagnosis not present

## 2016-08-13 MED ORDER — ALPRAZOLAM 0.5 MG PO TABS: ORAL_TABLET | ORAL | 0 refills | Status: DC

## 2016-08-13 NOTE — Addendum Note (Signed)
Addended by: Naomie DeanAHERN, Eduardo Wurth B on: 08/13/2016 02:06 PM   Modules accepted: Orders

## 2016-08-13 NOTE — Patient Instructions (Signed)
Overall you are doing fairly well but I do want to suggest a few things today:   Remember to drink plenty of fluid, eat healthy meals and do not skip any meals. Try to eat protein with a every meal and eat a healthy snack such as fruit or nuts in between meals. Try to keep a regular sleep-wake schedule and try to exercise daily, particularly in the form of walking, 20-30 minutes a day, if you can.   As far as your medications are concerned, I would like to suggest: Continue current medications  As far as diagnostic testing: MRI of the brain  I would like to see you back in 6 months, sooner if we need to. Please call us with any interim questions, concerns, problems, updates or refill requests.   Our phone number is [phone]. We also have an after hours call service for urgent matters and there is a physician on-call for urgent questions. For any emergencies you know to call 911 or go to the nearest emergency room  Ischemic Stroke An ischemic stroke (cerebrovascular accident, or CVA) is the sudden death of brain tissue that occurs when an area of the brain does not get enough oxygen. It is a medical emergency that must be treated right away. An ischemic stroke can cause permanent loss of brain function. This can cause problems with how different parts of your body function. What are the causes? This condition is caused by a decrease of oxygen supply to an area of the brain, which may be the result of:  A small blood clot (embolus) or a buildup of plaque in the blood vessels (atherosclerosis) that blocks blood flow in the brain.  An abnormal heart rhythm (atrial fibrillation).  A blocked or damaged artery in the head or neck.  What increases the risk? Certain factors may make you more likely to develop this condition. Some of these factors are things that you can change, such as:  Obesity.  Smoking cigarettes.  Taking oral birth control, especially if you also use tobacco.  Physical  inactivity.  Excessive alcohol use.  Use of illegal drugs, especially cocaine and methamphetamine.  Other risk factors include:  High blood pressure (hypertension).  High cholesterol.  Diabetes mellitus.  Heart disease.  Being [place], Native [address] [place] [address], Hispanic, or [place] Native.  Being over age 47.  Family history of stroke.  Previous history of blood clots, stroke, or transient ischemic attack (TIA).  Sickle cell disease.  Being a woman with a history of preeclampsia.  Migraine headache.  Sleep apnea.  Irregular heartbeats, such as atrial fibrillation.  Chronic inflammatory diseases, such as rheumatoid arthritis or lupus.  Blood clotting disorders (hypercoagulable state).  What are the signs or symptoms? Symptoms of this condition usually develop suddenly, or you may notice them after waking up from sleep. Symptoms may include sudden:  Weakness or numbness in your face, arm, or leg, especially on one side of your body.  Trouble walking or difficulty moving your arms or legs.  Loss of balance or coordination.  Confusion.  Slurred speech (dysarthria).  Trouble speaking, understanding speech, or both (aphasia).  Vision changes-such as double vision, blurred vision, or loss of vision-inone or both eyes.  Dizziness.  Nausea and vomiting.  Severe headache with no known cause. The headache is often described as the worst headache ever experienced.  If possible, make note of the exact time that you last felt like your normal self and what time your symptoms started. Tell your  health care provider. If symptoms come and go, this could be a sign of a warning stroke, or TIA. Get help right away, even if you feel better. How is this diagnosed? This condition may be diagnosed based on:  Your symptoms, your medical history, and a physical exam.  CT scan of the brain.  MRI.  CT angiogram. This test uses a computer to take X-rays of your arteries. A  dye may be injected into your blood to show the inside of your blood vessels more clearly.  MRI angiogram. This is a type of MRI that is used to evaluate the blood vessels.  Cerebral angiogram. This test uses X-rays and a dye to show the blood vessels in the brain and neck.  You may need to see a health care provider who specializes in stroke care. A stroke specialist can be seen in person or through communication using telephone or television technology (telemedicine). Other tests may also be done to find the cause of the stroke, such as:  Electrocardiogram (ECG).  Continuous heart monitoring.  Echocardiogram.  Carotid ultrasound.  A scan of the brain circulation.  Blood tests.  Sleep study to check for sleep apnea.  How is this treated? Treatment for this condition will depend on the duration, severity, and cause of your symptoms and on the area of the brain affected. It is very important to get treatment at the first sign of stroke symptoms. Some treatments work better if they are done within 3-6 hours of the onset of stroke symptoms. These initial treatments may include:  Aspirin.  Medicines to control blood pressure.  Medicine given by injection to dissolve the blood clot (thrombolytic).  Treatments given directly to the affected artery to remove or dissolve the blood clot.  Other treatment options may include:  Oxygen.  IV fluids.  Medicines to thin the blood (anticoagulants or antiplatelets).  Procedures to increase blood flow.  Medicines and changes to your diet may be used to help treat and manage risk factors for stroke, such as diabetes, high cholesterol, and high blood pressure. After a stroke, you may work with physical, speech, mental health, or occupational therapists to help you recover. Follow these instructions at home: Medicines  Take over-the-counter and prescription medicines only as told by your health care provider.  If you were told to take a  medicine to thin your blood, such as aspirin or an anticoagulant, take it exactly as told by your health care provider. ? Taking too much blood-thinning medicine can cause bleeding. ? If you do not take enough blood-thinning medicine, you will not have the protection that you need against another stroke and other problems.  Understand the side effects of taking anticoagulant medicine. When taking this type of medicine, make sure you: ? Hold pressure over any cuts for longer than usual. ? Tell your dentist and other health care providers that you are taking anticoagulants before you have any procedures that may cause bleeding. ? Avoid activities that may cause trauma or injury. Eating and drinking  Follow instructions from your health care provider about diet.  Eat healthy foods.  If your ability to swallow was affected by the stroke, you may need to take steps to avoid choking, such as: ? Taking small bites when eating. ? Eating foods that are soft or pureed. Safety  Follow instructions from your health care team about physical activity.  Use a walker or cane as told by your health care provider.  Take steps to create  a safe home environment in order to reduce the risk of falls. This may include: ? Having your home looked at by specialists. ? Installing grab bars in the bedroom and bathroom. ? Using safety equipment, such as raised toilets and a seat in the shower. General instructions  Do not use any tobacco products, such as cigarettes, chewing tobacco, and e-cigarettes. If you need help quitting, ask your health care provider.  Limit alcohol intake to no more than 1 drink a day for nonpregnant women and 2 drinks a day for men. One drink equals 12 oz of beer, 5 oz of wine, or 1 oz of hard liquor.  If you need help to stop using drugs or alcohol, ask your health care provider about a referral to a program or specialist.  Maintain an active and healthy lifestyle. Get regular  exercise as told by your health care provider.  Keep all follow-up visits as told by your health care provider, including visits with all specialists on your health care team. This is important. How is this prevented? Your risk of another stroke can be decreased by managing high blood pressure, high cholesterol, diabetes, heart disease, sleep apnea, and obesity. It can also be decreased by quitting smoking, limiting alcohol, and staying physically active. Your health care provider will continue to work with you on measures to prevent short-term and long-term complications of stroke. Get help right away if: You have:  Sudden weakness or numbness in your face, arm, or leg, especially on one side of your body.  Sudden confusion.  Sudden trouble speaking, understanding, or both (aphasia).  Sudden trouble seeing with one or both eyes.  Sudden trouble walking or difficulty moving your arms or legs.  Sudden dizziness.  Sudden loss of balance or coordination.  Sudden, severe headache with no known cause.  A partial or total loss of consciousness.  A seizure. Any of these symptoms may represent a serious problem that is an emergency. Do not wait to see if the symptoms will go away. Get medical help right away. Call your local emergency services (911 in [place] Do not drive yourself to the hospital. This information is not intended to replace advice given to you by your health care provider. Make sure you discuss any questions you have with your health care provider. Document Released: 03/02/2005 Document Revised: 08/13/2015 Document Reviewed: 05/29/2015 Elsevier Interactive Patient Education  2017 [employer].

## 2016-08-13 NOTE — Progress Notes (Addendum)
[facility]    Provider:  Dr Lucia GaskinsAhern Referring Provider: Marvel PlanXu, Jindong, MD  CC:  stroke  HPI:  Jack [name] is a 46 y.o. male here as a referral from Dr. Roda ShuttersXu for follow-up after hospital admission. Per review of records, patient has a history of hypertension and anxiety, severe category phobia and panic and presented with blurred vision and slurred speech, intermittent visual droop and right-sided weakness on 07/02/2016. He had not been on antiplatelet therapy and blood pressure was markedly elevated. He was started on a Cardene drip. NA stroke scale was 4. His deficits were rapidly resolving and he was not given TPA. Patient was admitted for workup. Patient experienced a third episode of weakness and right hemiparesis side onset at 4 AM and at that time and AH stroke scale was 6 and at that time TPA was administered. Neurologic exam showed mild drift of right extremities otherwise unremarkable. Patient improved while in the hospital. He was seen by the stroke team who noted mild dysarthria, mild right lower facial weakness, mild right hemiparesis. Diagnosed with likely left MCA infarct however patient refused MRI. CT of the head was negative for strokes. 2-D echo showed normal ejection fraction and no source of embolus. LDL was 239, hemoglobin A1c 6.1, HIV negative. Blood pressures were as high as 182/111.  He feels 80% recovered. Speech is better and strength is betterr. He had PT, OT and speech therapy. He has some pain in the left occipital area the last few days. He could not tolerate the stroke. He found a primary care and he was started on metoprolol and his BP is well controlled. He has OCD and agoraphobia so needs an open MRI.   Reviewed notes, labs and imaging from outside physicians, which showed:  Reviewed extensive lab history is unremarkable including CBC, sedimentation rate, CRP, ANA, anti-DNA antibody, alpha galactosidase, cardiolipin antibodies, homocystine,  beta-2 glycoprotein, lupus anticoagulant, hiv. Hemoglobin A1c was 6.1 and LDL was 239. BMP was essentially normal with elevated glucose 118.  CT head  showed No acute intracranial abnormalities including mass lesion or mass effect, hydrocephalus, extra-axial fluid collection, midline shift, hemorrhage, or acute infarction, large ischemic events (personally reviewed images)   Reviewed report CT angiogram of the head and angiogram the neck which were unremarkable for any significant stenosis mild atheromatous plaque within the carotid siphons without significant stenosis. However it did show emphysema.He lost 20 pounds.    Review of Systems: Patient complains of symptoms per HPI as well as the following symptoms: ringing in ears. Pertinent negatives and positives per HPI. All others negative.   Social History   Social History  . Marital status: Single    Spouse name: N/A  . Number of children: 0  . Years of education: Some college   Occupational History  . [employer]    Social History Main Topics  . Smoking status: Never Smoker  . Smokeless tobacco: Never Used  . Alcohol use No  . Drug use: No  . Sexual activity: Not on file   Other Topics Concern  . Not on file   Social History Narrative   Lives w/ his partner   Right-handed   Caffeine: none    Family History  Problem Relation Age of Onset  . Adopted: Yes  . Stroke Neg Hx     Past Medical History:  Diagnosis Date  . Anxiety   . Gout   . High cholesterol   . Hypertension     Past  Surgical History:  Procedure Laterality Date  . NO PAST SURGERIES      Current Outpatient Prescriptions  Medication Sig Dispense Refill  . allopurinol (ZYLOPRIM) 300 MG tablet Take 300 mg by mouth daily.  0  . aspirin 325 MG tablet Take 1 tablet (325 mg total) by mouth daily.    Marland Kitchen atorvastatin (LIPITOR) 80 MG tablet Take 1 tablet (80 mg total) by mouth daily at 6 PM. 30 tablet 5  . omeprazole (PRILOSEC) 20 MG capsule Take 20 mg  by mouth daily.    . TOPROL XL 100 MG 24 hr tablet TK 1 T PO D  1   No current facility-administered medications for this visit.     Allergies as of 08/13/2016 - Review Complete 08/13/2016  Allergen Reaction Noted  . Levaquin [levofloxacin in d5w] Itching and Swelling 04/26/2015    Vitals: BP 132/84   Pulse 60   Ht 6' (1.829 m)   Wt 199 lb 12.8 oz (90.6 kg)   BMI 27.10 kg/m  Last Weight:  Wt Readings from Last 1 Encounters:  08/13/16 199 lb 12.8 oz (90.6 kg)   Last Height:   Ht Readings from Last 1 Encounters:  08/13/16 6' (1.829 m)    Physical exam: Exam: Gen: NAD, conversant, well nourised, well groomed                     CV: RRR, no MRG. No Carotid Bruits. No peripheral edema, warm, nontender Eyes: Conjunctivae clear without exudates or hemorrhage  Neuro: Detailed Neurologic Exam  Speech:    Speech is mildly dysarthric but understandable without issue; fluent and spontaneous with normal comprehension.  Cognition:    The patient is oriented to person, place, and time;     recent and remote memory intact;     language fluent;     normal attention, concentration,     fund of knowledge Cranial Nerves:    The pupils are equal, round, and reactive to light. The fundi are normal and spontaneous venous pulsations are present. Visual fields are full to finger confrontation. Extraocular movements are intact. Trigeminal sensation is intact and the muscles of mastication are normal. Right NL flattening. The palate elevates in the midline. Hearing intact. Voice is normal. Shoulder shrug is normal. The tongue has normal motion without fasciculations.   Coordination:    No dysmetria. Decreased fine motor in the right hand.  Gait:    Not ataxic  Motor Observation:    No asymmetry, no atrophy, and no involuntary movements noted. Tone:    Normal muscle tone.    Posture:    Posture is normal. normal erect    Strength:    Strength is V/V in the upper and lower limbs.       Sensation: intact to LT     Reflex Exam:  DTR's:    Deep tendon reflexes in the upper and lower extremities are normal bilaterally.   Toes:    The toes are downgoing bilaterally.   Clonus:    Clonus is absent.      Assessment/Plan:  This is a patient who presented to [facility] on 07/02/2016 with stuttering symptoms of dysarthria, mild right-sided weakness and mild right facial droop. CTs of the head were negative. He was given IV TPA. Patient's LDL was 239 and his blood pressure was significantly elevated up to 189/111 and he was treated for both of these conditions. He was not on antiplatelet therapy prior to incident.  Diagnosis was likely left MCA stroke however patient could not tolerate  MRIs.  He is doing well, his blood pressure is well-controlled, he is exercising and has lost 20 pounds, he is compliant with his medications. He could not tolerate MRI of the brain in the hospital and I think this would be very important to exam in the distribution of the stroke and possibly further testing for example if embolic appearing would recommend TEE and loop. Since he has anxiety and agoraphobia phobia will order open MRI of the brain.  I had a long d/w patient about his recent stroke, risk for recurrent stroke/TIAs, personally independently reviewed imaging studies and stroke evaluation results and answered questions.Continue ASA 325mg  for secondary stroke prevention and maintain strict control of hypertension with blood pressure goal below 130/90, diabetes with hemoglobin A1c goal below 6.5% and lipids with LDL cholesterol goal below 70 mg/dL.  Mild emphysema was seen incidentally on CT imaging of the blood vessels in the neck. I discussed this with patient, gave him a copy of the report and asked him to follow-up with primary care on this and possibly primary care will send him to pulmonology to further explore if he has emphysema, the causes and treatment.  Naomie Dean,  MD  [facility] [address] [place], [place] [address]  Phone [phone] Fax [phone]

## 2016-08-13 NOTE — Progress Notes (Signed)
Xanax rx printed, signed and faxed to pharmacy. 

## 2016-08-28 ENCOUNTER — Ambulatory Visit
Admission: RE | Admit: 2016-08-28 | Discharge: 2016-08-28 | Disposition: A | Discharge status: Home / Self Care | Payer: BLUE CROSS/BLUE SHIELD | Source: Ambulatory Visit | Attending: Neurology | Admitting: Neurology

## 2016-08-28 DIAGNOSIS — I639 Cerebral infarction, unspecified: Secondary | ICD-10-CM

## 2016-08-28 DIAGNOSIS — R200 Anesthesia of skin: Secondary | ICD-10-CM

## 2016-08-28 DIAGNOSIS — I69351 Hemiplegia and hemiparesis following cerebral infarction affecting right dominant side: Secondary | ICD-10-CM

## 2016-08-28 DIAGNOSIS — R471 Dysarthria and anarthria: Secondary | ICD-10-CM | POA: Diagnosis not present

## 2016-09-01 ENCOUNTER — Telehealth: Payer: Self-pay | Admitting: Neurology

## 2016-09-01 NOTE — Telephone Encounter (Signed)
Pt called in and said he received a call from Dr Lucia GaskinsAhern to call office and schedule appt to come in and  discuss MRI Thanks  Please call pt dg

## 2016-09-01 NOTE — Telephone Encounter (Signed)
Yes please have him come in so I can review MRi with him and next steps thanks

## 2016-09-01 NOTE — Telephone Encounter (Signed)
New pt seen 08/13/16 for cryptogenic stroke.  MRI 08/28/16 results -  IMPRESSION:  This MRI of the brain without contrast shows a sub-chronic stroke involving the posterior limb of the left internal capsule and adjacent basal ganglia.  There has been expected evolution since the CT scan dated 07/03/2016 where there was mild hypodensity in that region consistent with a more acute stroke at that time.  The brain is otherwise normal for age.  Pt currently has follow-up scheduled 02/15/17.

## 2016-09-02 NOTE — Telephone Encounter (Signed)
Called pt, appt scheduled for Monday at 1 pm. Pt agreed to arrive at least 15 min prior to scheduled appt time.

## 2016-09-03 DIAGNOSIS — Z79899 Other long term (current) drug therapy: Secondary | ICD-10-CM | POA: Diagnosis not present

## 2016-09-03 DIAGNOSIS — M1009 Idiopathic gout, multiple sites: Secondary | ICD-10-CM | POA: Diagnosis not present

## 2016-09-03 DIAGNOSIS — I6359 Cerebral infarction due to unspecified occlusion or stenosis of other cerebral artery: Secondary | ICD-10-CM | POA: Diagnosis not present

## 2016-09-03 DIAGNOSIS — M2550 Pain in unspecified joint: Secondary | ICD-10-CM | POA: Diagnosis not present

## 2016-09-07 ENCOUNTER — Encounter (INDEPENDENT_AMBULATORY_CARE_PROVIDER_SITE_OTHER): Payer: Self-pay

## 2016-09-07 ENCOUNTER — Encounter: Payer: Self-pay | Admitting: Neurology

## 2016-09-07 ENCOUNTER — Ambulatory Visit (INDEPENDENT_AMBULATORY_CARE_PROVIDER_SITE_OTHER): Payer: BLUE CROSS/BLUE SHIELD | Admitting: Neurology

## 2016-09-07 VITALS — BP 128/82 | HR 62 | Ht 72.0 in | Wt 197.0 lb

## 2016-09-07 DIAGNOSIS — I639 Cerebral infarction, unspecified: Secondary | ICD-10-CM | POA: Diagnosis not present

## 2016-09-07 DIAGNOSIS — G379 Demyelinating disease of central nervous system, unspecified: Secondary | ICD-10-CM

## 2016-09-07 DIAGNOSIS — I6381 Other cerebral infarction due to occlusion or stenosis of small artery: Secondary | ICD-10-CM

## 2016-09-07 NOTE — Progress Notes (Signed)
[facility]    Provider:  Dr Lucia Gaskins Referring Provider: No ref. provider found Primary Care Physician:  Patient, No Pcp Per  Provider:  Dr Lucia Gaskins Referring Provider: Marvel Plan, MD  CC:  stroke  Interval history 09/08/2016:   Patient returns today to review MRI results as below which did show subchronic stroke involving the posterior limb of the left internal capsule and basal ganglia. I reviewed images with patient and we discussed the lesion which I do believe is a stroke however cannot rule out a demyelinating plaque at this point I think it would be wise to rescan patient 6 months and follow the evolution. Next MRI should be done with and without contrast.  MRI brain(personally reviewed images): 08/28/2016:  This MRI of the brain without contrast shows a sub-chronic stroke involving the posterior limb of the left internal capsule and adjacent basal ganglia.  There has been expected evolution since the CT scan dated 07/03/2016 where there was mild hypodensity in that region consistent with a more acute stroke at that time.  The brain is otherwise normal for age.  HPI:  Jack [name] is a 46 y.o. male here as a referral from Dr. Roda Shutters for follow-up after hospital admission. Per review of records, patient has a history of hypertension and anxiety, severe category phobia and panic and presented with blurred vision and slurred speech, intermittent visual droop and right-sided weakness on 07/02/2016. He had not been on antiplatelet therapy and blood pressure was markedly elevated. He was started on a Cardene drip. NA stroke scale was 4. His deficits were rapidly resolving and he was not given TPA. Patient was admitted for workup. Patient experienced a third episode of weakness and right hemiparesis side onset at 4 AM and at that time and AH stroke scale was 6 and at that time TPA was administered. Neurologic exam showed mild drift of right extremities otherwise unremarkable.  Patient improved while in the hospital. He was seen by the stroke team who noted mild dysarthria, mild right lower facial weakness, mild right hemiparesis. Diagnosed with likely left MCA infarct however patient refused MRI. CT of the head was negative for strokes. 2-D echo showed normal ejection fraction and no source of embolus. LDL was 239, hemoglobin A1c 6.1, HIV negative. Blood pressures were as high as 182/111.  He feels 80% recovered. Speech is better and strength is betterr. He had PT, OT and speech therapy. He has some pain in the left occipital area the last few days. He could not tolerate the stroke. He found a primary care and he was started on metoprolol and his BP is well controlled. He has OCD and agoraphobia so needs an open MRI.   Reviewed notes, labs and imaging from outside physicians, which showed:  Reviewed extensive lab history is unremarkable including CBC, sedimentation rate, CRP, ANA, anti-DNA antibody, alpha galactosidase, cardiolipin antibodies, homocystine, beta-2 glycoprotein, lupus anticoagulant, hiv. Hemoglobin A1c was 6.1 and LDL was 239. BMP was essentially normal with elevated glucose 118.  CT head  showed No acute intracranial abnormalities including mass lesion or mass effect, hydrocephalus, extra-axial fluid collection, midline shift, hemorrhage, or acute infarction, large ischemic events (personally reviewed images)   Reviewed report CT angiogram of the head and angiogram the neck which were unremarkable for any significant stenosis mild atheromatous plaque within the carotid siphons without significant stenosis. However it did show emphysema.He lost 20 pounds.    Review of Systems: Patient complains of symptoms per HPI as well as  the following symptoms: ringing in ears. Pertinent negatives and positives per HPI. All others negative.  Social History   Social History  . Marital status: Single    Spouse name: N/A  . Number of children: 0  . Years of  education: Some college   Occupational History  . [employer]    Social History Main Topics  . Smoking status: Never Smoker  . Smokeless tobacco: Never Used  . Alcohol use No  . Drug use: No  . Sexual activity: Not on file   Other Topics Concern  . Not on file   Social History Narrative   Lives w/ his partner   Right-handed   Caffeine: none    Family History  Problem Relation Age of Onset  . Adopted: Yes  . Stroke Neg Hx     Past Medical History:  Diagnosis Date  . Anxiety   . Gout   . High cholesterol   . Hypertension     Past Surgical History:  Procedure Laterality Date  . NO PAST SURGERIES      Current Outpatient Prescriptions  Medication Sig Dispense Refill  . allopurinol (ZYLOPRIM) 300 MG tablet Take 300 mg by mouth daily.  0  . ALPRAZolam (XANAX) 0.5 MG tablet Take 1-2 tablets 30-60 minutes before MRI of the brain. May take 1-2 additional as needed. This can cause sedation do not drive. 4 tablet 0  . aspirin 325 MG tablet Take 1 tablet (325 mg total) by mouth daily.    Marland Kitchen atorvastatin (LIPITOR) 80 MG tablet Take 1 tablet (80 mg total) by mouth daily at 6 PM. 30 tablet 5  . omeprazole (PRILOSEC) 20 MG capsule Take 20 mg by mouth daily.    . TOPROL XL 100 MG 24 hr tablet TK 1 T PO D  1   No current facility-administered medications for this visit.     Allergies as of 09/07/2016 - Review Complete 09/07/2016  Allergen Reaction Noted  . Levaquin [levofloxacin in d5w] Itching and Swelling 04/26/2015    Vitals: BP 128/82   Pulse 62   Ht 6' (1.829 m)   Wt 197 lb (89.4 kg)   BMI 26.72 kg/m  Last Weight:  Wt Readings from Last 1 Encounters:  09/07/16 197 lb (89.4 kg)   Last Height:   Ht Readings from Last 1 Encounters:  09/07/16 6' (1.829 m)     Physical exam: Exam: Gen: NAD, conversant, well nourised, well groomed                     CV: RRR, no MRG. No Carotid Bruits. No peripheral edema, warm, nontender Eyes: Conjunctivae clear without  exudates or hemorrhage  Neuro: Detailed Neurologic Exam  Speech:    Speech is mildly dysarthric but understandable without issue; fluent and spontaneous with normal comprehension.  Cognition:    The patient is oriented to person, place, and time;     recent and remote memory intact;     language fluent;     normal attention, concentration,     fund of knowledge Cranial Nerves:    The pupils are equal, round, and reactive to light. The fundi are normal and spontaneous venous pulsations are present. Visual fields are full to finger confrontation. Extraocular movements are intact. Trigeminal sensation is intact and the muscles of mastication are normal. Right NL flattening. The palate elevates in the midline. Hearing intact. Voice is normal. Shoulder shrug is normal. The tongue has normal motion without fasciculations.  Coordination:    No dysmetria. Decreased fine motor in the right hand.  Gait:    Not ataxic  Motor Observation:    No asymmetry, no atrophy, and no involuntary movements noted. Tone:    Normal muscle tone.    Posture:    Posture is normal. normal erect    Strength:    Strength is V/V in the upper and lower limbs.      Sensation: intact to LT     Reflex Exam:  DTR's:    Deep tendon reflexes in the upper and lower extremities are normal bilaterally.   Toes:    The toes are downgoing bilaterally.   Clonus:    Clonus is absent.      Assessment/Plan:  This is a patient who [facility] with stuttering symptoms of dysarthria, mild right-sided weakness and mild right facial droop. CTs of the head were negative. He was given IV TPA. Patient's LDL was 239 and his blood pressure was significantly elevated up to 189/111 and he was treated for both of these conditions. He was not on antiplatelet therapy prior to incident. Diagnosis was likely left MCA stroke however patient could not tolerate  MRIs.  - MRI results as below  which did show subchronic stroke involving the posterior limb of the left internal capsule and basal ganglia. I reviewed images with patient and we discussed the lesion which I do believe is a stroke however cannot rule out a demyelinating plaque at this point I think it would be wise to rescan patient 6 months and follow the evolution. Next MRI should be done with and without contrast. Discussed Lp will hold off for now.   He is doing well, his blood pressure is well-controlled, he is exercising and has lost 20 pounds, he is compliant with his medications. He could not tolerate MRI of the brain in the hospital and I think this would be very important to exam in the distribution of the stroke and possibly further testing for example if embolic appearing would recommend TEE and loop. Since he has anxiety and agoraphobia phobia will order open MRI of the brain.  I had a long d/w patient about his recent stroke, risk for recurrent stroke/TIAs, personally independently reviewed imaging studies and stroke evaluation results and answered questions.Continue ASA 325mg  for secondary stroke prevention and maintain strict control of hypertension with blood pressure goal below 130/90, diabetes with hemoglobin A1c goal below 6.5% and lipids with LDL cholesterol goal below 70 mg/dL.  Mild emphysema was seen incidentally on CT imaging of the blood vessels in the neck. I discussed this with patient, gave him a copy of the report and asked him to follow-up with primary care on this and possibly primary care will send him to pulmonology to further explore if he has emphysema, the causes and treatment.  Naomie DeanAntonia Ahern, MD  [facility] [address] [place], [place] [id number]  Phone [phone] Fax [phone]  A total of 25 minutes was spent face-to-face with this patient. Over half this time was spent on counseling patient on the stroke vs demyelinating lesiondiagnosis and  different diagnostic and therapeutic options available.

## 2016-09-08 DIAGNOSIS — I10 Essential (primary) hypertension: Secondary | ICD-10-CM | POA: Diagnosis not present

## 2016-09-08 DIAGNOSIS — I639 Cerebral infarction, unspecified: Secondary | ICD-10-CM | POA: Diagnosis not present

## 2016-09-08 DIAGNOSIS — Z6827 Body mass index (BMI) 27.0-27.9, adult: Secondary | ICD-10-CM | POA: Diagnosis not present

## 2016-09-14 DIAGNOSIS — F410 Panic disorder [episodic paroxysmal anxiety] without agoraphobia: Secondary | ICD-10-CM | POA: Diagnosis not present

## 2016-10-08 DIAGNOSIS — I10 Essential (primary) hypertension: Secondary | ICD-10-CM | POA: Diagnosis not present

## 2016-10-13 DIAGNOSIS — R7301 Impaired fasting glucose: Secondary | ICD-10-CM | POA: Diagnosis not present

## 2016-10-13 DIAGNOSIS — E782 Mixed hyperlipidemia: Secondary | ICD-10-CM | POA: Diagnosis not present

## 2016-10-13 DIAGNOSIS — Z6828 Body mass index (BMI) 28.0-28.9, adult: Secondary | ICD-10-CM | POA: Diagnosis not present

## 2016-10-13 DIAGNOSIS — I10 Essential (primary) hypertension: Secondary | ICD-10-CM | POA: Diagnosis not present

## 2016-10-14 ENCOUNTER — Other Ambulatory Visit: Payer: Self-pay

## 2016-10-23 DIAGNOSIS — F4010 Social phobia, unspecified: Secondary | ICD-10-CM | POA: Diagnosis not present

## 2016-10-23 DIAGNOSIS — F411 Generalized anxiety disorder: Secondary | ICD-10-CM | POA: Diagnosis not present

## 2016-10-23 DIAGNOSIS — F408 Other phobic anxiety disorders: Secondary | ICD-10-CM | POA: Diagnosis not present

## 2016-11-03 ENCOUNTER — Ambulatory Visit (INDEPENDENT_AMBULATORY_CARE_PROVIDER_SITE_OTHER): Payer: BLUE CROSS/BLUE SHIELD | Admitting: Physician Assistant

## 2016-11-03 ENCOUNTER — Encounter: Payer: Self-pay | Admitting: Physician Assistant

## 2016-11-03 VITALS — BP 145/96 | HR 75 | Ht 72.0 in | Wt 203.0 lb

## 2016-11-03 DIAGNOSIS — F4001 Agoraphobia with panic disorder: Secondary | ICD-10-CM | POA: Diagnosis not present

## 2016-11-03 DIAGNOSIS — I6930 Unspecified sequelae of cerebral infarction: Secondary | ICD-10-CM | POA: Diagnosis not present

## 2016-11-03 DIAGNOSIS — I2510 Atherosclerotic heart disease of native coronary artery without angina pectoris: Secondary | ICD-10-CM

## 2016-11-03 DIAGNOSIS — R7303 Prediabetes: Secondary | ICD-10-CM | POA: Diagnosis not present

## 2016-11-03 DIAGNOSIS — E8881 Metabolic syndrome: Secondary | ICD-10-CM | POA: Diagnosis not present

## 2016-11-03 DIAGNOSIS — Z23 Encounter for immunization: Secondary | ICD-10-CM | POA: Diagnosis not present

## 2016-11-03 DIAGNOSIS — Z8739 Personal history of other diseases of the musculoskeletal system and connective tissue: Secondary | ICD-10-CM

## 2016-11-03 DIAGNOSIS — E782 Mixed hyperlipidemia: Secondary | ICD-10-CM

## 2016-11-03 DIAGNOSIS — I10 Essential (primary) hypertension: Secondary | ICD-10-CM

## 2016-11-03 DIAGNOSIS — E785 Hyperlipidemia, unspecified: Secondary | ICD-10-CM | POA: Insufficient documentation

## 2016-11-03 DIAGNOSIS — Z7689 Persons encountering health services in other specified circumstances: Secondary | ICD-10-CM | POA: Diagnosis not present

## 2016-11-03 LAB — POCT GLYCOSYLATED HEMOGLOBIN (HGB A1C): HEMOGLOBIN A1C: 5.4

## 2016-11-03 MED ORDER — ATORVASTATIN CALCIUM 40 MG PO TABS: 40.0000 mg | ORAL_TABLET | Freq: Every day | ORAL | 5 refills | Status: DC

## 2016-11-03 MED ORDER — AMLODIPINE BESYLATE 5 MG PO TABS: 5.0000 mg | ORAL_TABLET | Freq: Every day | ORAL | 5 refills | Status: DC

## 2016-11-03 MED ORDER — METOPROLOL SUCCINATE ER 200 MG PO TB24: 200.0000 mg | ORAL_TABLET | Freq: Every day | ORAL | 5 refills | Status: DC

## 2016-11-03 NOTE — Progress Notes (Signed)
HPI:                                                                Jack [name] is a 46 y.o. male who presents to [name]: Primary Care Sports Medicine today to establish care   Hx of CVA: 4 months ago. Reports initially he could not walk and had unilateral weakness. Reports only remaining deficit is difficulty with speech. Followed by Dr. Lucia Gaskins, Neurology. Taking aspirin 325mg  daily.  HLD: reports he has been intolerant to multiple statins in the past. Given history of stroke, he would like to try something.  HTN: taking Metoprolol daily. Compliant with medications. Checks BP's at home. BP range 140's/80-90s. Denies vision change, headache, chest pain with exertion, orthopnea, lightheadedness, syncope and edema.   Gout: followed by Dr. Nickola Major, Rheumatology. Taking Allopurinol daily and Colchicine prn.  GAD: followed by Whitney Muse NP. Taking Xanax prn and Wellbutrin daily.   Health Maintenance Health Maintenance  Topic Date Due  . INFLUENZA VACCINE  12/14/2017 (Originally 10/14/2016)  . TETANUS/TDAP  11/04/2026  . HIV Screening  Completed     Health Habits  Diet:  Exercise:  ETOH:  Tobacco:  Drugs:  Past Medical History:  Diagnosis Date  . Anxiety   . Gout   . High cholesterol   . Hypertension   . Stroke [facility])    Past Surgical History:  Procedure Laterality Date  . NO PAST SURGERIES     Social History  Substance Use Topics  . Smoking status: Never Smoker  . Smokeless tobacco: Never Used  . Alcohol use No   family history is not on file. He was adopted.  ROS: negative except as noted in the HPI  Medications: Current Outpatient Prescriptions  Medication Sig Dispense Refill  . allopurinol (ZYLOPRIM) 300 MG tablet Take 300 mg by mouth daily.  0  . ALPRAZolam (XANAX) 0.5 MG tablet Take 1-2 tablets 30-60 minutes before MRI of the brain. May take 1-2 additional as needed. This can cause sedation do not drive. 4 tablet 0  .  aspirin 325 MG tablet Take 1 tablet (325 mg total) by mouth daily.    Marland Kitchen buPROPion (ZYBAN) 150 MG 12 hr tablet Take 150 mg by mouth daily.    . colchicine 0.6 MG tablet     . metFORMIN (GLUCOPHAGE-XR) 500 MG 24 hr tablet Take 1 tablet by mouth daily.  2  . metoprolol (TOPROL-XL) 200 MG 24 hr tablet Take 1 tablet (200 mg total) by mouth daily. 30 tablet 5  . amLODipine (NORVASC) 5 MG tablet Take 1 tablet (5 mg total) by mouth daily. 30 tablet 5  . atorvastatin (LIPITOR) 40 MG tablet Take 1 tablet (40 mg total) by mouth at bedtime. 30 tablet 5   No current facility-administered medications for this visit.    Allergies  Allergen Reactions  . Edarbi [Azilsartan] Other (See Comments)    depression  . Levaquin [Levofloxacin In D5w] Itching and Swelling  . Lisinopril Cough  . Valsartan Rash       Objective:  BP (!) 145/96   Pulse 75   Ht 6' (1.829 m)   Wt 203 lb (92.1 kg)   BMI 27.53 kg/m  Gen:  alert, not ill-appearing, no distress, appropriate  for age HEENT: head normocephalic without obvious abnormality, conjunctiva and cornea clear, trachea midline Pulm: Normal work of breathing, normal phonation, clear to ausculation CV: normal rate, regular rhythm, s1 and s2 distinct, no murmurs Neuro: alert and oriented x 3, no tremor MSK: extremities atraumatic, normal gait and station Skin: intact, no rashes on exposed skin, no jaundice, no cyanosis Psych: well-groomed, cooperative, good eye contact, euthymic mood, affect mood-congruent, speech is articulate, and thought processes clear and goal-directed  Results for orders placed or performed in visit on 11/03/16 (from the past 72 hour(s))  POCT HgB A1C     Status: Normal   Collection Time: 11/03/16  2:57 PM  Result Value Ref Range   Hemoglobin A1C 5.4    No results found.  Depression screen PHQ 2/9 11/03/2016  Decreased Interest 0  Down, Depressed, Hopeless 0  PHQ - 2 Score 0     Assessment and Plan: 46 y.o. male with   1.  Encounter to establish care - reviewed PMH, PSH, PFH - reviewed outside labs, which will be abstracted to his record - reviewed health maintenance - Tdap updated - declined influenza vaccine  2. Prediabetes - last fasting glucose 184, 10/09/16 - POCT HgB A1C 5.4 normal - cont Metformin XR 500mg  daily  3. History of CVA with residual deficit - cont Aspirin 325mg  daily 0 follow-up with neurology as directed  4. Hypertension goal BP (blood pressure) < 130/80 BP Readings from Last 3 Encounters:  11/03/16 (!) 145/96  09/07/16 128/82  08/13/16 132/84  - BP not at goal. Allergic to ARB/ACE. Adding Amlodipine - patient to log BP's at home - Limit salt to <1500 mg/day - Follow DASH eating plan - limit alcohol to 2 standard drinks per day - avoid tobacco products - weight loss: 7% of current body weight - Follow-up in in 4 weeks - amLODipine (NORVASC) 5 MG tablet; Take 1 tablet (5 mg total) by mouth daily.  Dispense: 30 tablet; Refill: 5 - metoprolol (TOPROL-XL) 200 MG 24 hr tablet; Take 1 tablet (200 mg total) by mouth daily.  Dispense: 30 tablet; Refill: 5  5. Mixed hyperlipidemia - Lipid panel 10/08/16 LDL 196, TC 276, TG 209, HDL 38 - will trial Atorvastatin every other evening at bedtime. Advised not to take Colchicine daily as this can increase risk of myalgias. - if intolerant or not at goal, will consider Livalo or Repatha - atorvastatin (LIPITOR) 40 MG tablet; Take 1 tablet (40 mg total) by mouth at bedtime.  Dispense: 30 tablet; Refill: 5  6. Metabolic syndrome X - atorvastatin (LIPITOR) 40 MG tablet; Take 1 tablet (40 mg total) by mouth at bedtime.  Dispense: 30 tablet; Refill: 5 - Metformin XR 500 mg daily  7. ASCVD (arteriosclerotic cardiovascular disease) - atorvastatin (LIPITOR) 40 MG tablet; Take 1 tablet (40 mg total) by mouth at bedtime.  Dispense: 30 tablet; Refill: 5  8. Need for Tdap vaccination - Tdap vaccine greater than or equal to 7yo IM  Patient education  and anticipatory guidance given Patient agrees with treatment plan Follow-up in 4 weeks for HTN or sooner as needed  Levonne Hubert PA-C

## 2016-11-03 NOTE — Patient Instructions (Addendum)
- Atorvastatin at bedtime every other day - Colchicine for gout can increase the risk of body aches with Atorvastatin. I would recommend talking to your Rheumatologist about possibly stopping Colchicine and other options to control your uric acid level. Given history of stroke, very important to get LDL cholesterol below 100  - Recheck fasting lipid panel in 6 weeks - If unable to get LDL at goal or tolerate Atorvastatin we can try a medication called Repatha  For your blood pressure: - Start Amlodipine 5mg  daily - Continue your Metoprolol - Log BP readings - Check around the same time each day in a relaxed setting - Limit salt to <1500 mg/day - Follow DASH eating plan - limit alcohol to 2 standard drinks per day - avoid tobacco products - weight loss: 7% of current body weight - Follow-up in in 4 weeks  Diet - limit carbohydrates to 30 per meal and 15 for snacks  Fat and Cholesterol Restricted Diet High levels of fat and cholesterol in your blood may lead to various health problems, such as diseases of the heart, blood vessels, gallbladder, liver, and pancreas. Fats are concentrated sources of energy that come in various forms. Certain types of fat, including saturated fat, may be harmful in excess. Cholesterol is a substance needed by your body in small amounts. Your body makes all the cholesterol it needs. Excess cholesterol comes from the food you eat. When you have high levels of cholesterol and saturated fat in your blood, health problems can develop because the excess fat and cholesterol will gather along the walls of your blood vessels, causing them to narrow. Choosing the right foods will help you control your intake of fat and cholesterol. This will help keep the levels of these substances in your blood within normal limits and reduce your risk of disease. What is my plan? Your health care provider recommends that you:  Limit your fat intake to 5% or less of your total calories  per day.  Limit the amount of cholesterol in your diet to less than 200 mg per day.  Eat 20-30 grams of fiber each day.  What types of fat should I choose?  Choose healthy fats more often. Choose monounsaturated and polyunsaturated fats, such as olive and canola oil, flaxseeds, walnuts, almonds, and seeds.  Eat more omega-3 fats. Good choices include salmon, mackerel, sardines, tuna, flaxseed oil, and ground flaxseeds. Aim to eat fish at least two times a week.  Limit saturated fats. Saturated fats are primarily found in animal products, such as meats, butter, and cream. Plant sources of saturated fats include palm oil, palm kernel oil, and coconut oil.  Avoid foods with partially hydrogenated oils in them. These contain trans fats. Examples of foods that contain trans fats are stick margarine, some tub margarines, cookies, crackers, and other baked goods. What general guidelines do I need to follow? These guidelines for healthy eating will help you control your intake of fat and cholesterol:  Check food labels carefully to identify foods with trans fats or high amounts of saturated fat.  Fill one half of your plate with vegetables and green salads.  Fill one fourth of your plate with whole grains. Look for the word "whole" as the first word in the ingredient list.  Fill one fourth of your plate with lean protein foods.  Limit fruit to two servings a day. Choose fruit instead of juice.  Eat more foods that contain fiber, such as apples, broccoli, carrots, beans, peas, and  barley.  Eat more home-cooked food and less restaurant, buffet, and fast food.  Limit or avoid alcohol.  Limit foods high in starch and sugar.  Limit fried foods.  Cook foods using methods other than frying. Baking, boiling, grilling, and broiling are all great options.  Lose weight if you are overweight. Losing just 5-10% of your initial body weight can help your overall health and prevent diseases such as  diabetes and heart disease.  What foods can I eat? Grains  Whole grains, such as whole wheat or whole grain breads, crackers, cereals, and pasta. Unsweetened oatmeal, bulgur, barley, quinoa, or brown rice. Corn or whole wheat flour tortillas. Vegetables  Fresh or frozen vegetables (raw, steamed, roasted, or grilled). Green salads. Fruits  All fresh, canned (in natural juice), or frozen fruits. Meats and other protein foods  Ground beef (85% or leaner), grass-fed beef, or beef trimmed of fat. Skinless chicken or [place]. Ground chicken or [place]. Pork trimmed of fat. All fish and seafood. Eggs. Dried beans, peas, or lentils. Unsalted nuts or seeds. Unsalted canned or dry beans. Dairy  Low-fat dairy products, such as skim or 1% milk, 2% or reduced-fat cheeses, low-fat ricotta or cottage cheese, or plain low-fat yo Fats and oils  Tub margarines without trans fats. Light or reduced-fat mayonnaise and salad dressings. Avocado. Olive, canola, sesame, or safflower oils. Natural peanut or almond butter (choose ones without added sugar and oil). The items listed above may not be a complete list of recommended foods or beverages. Contact your dietitian for more options. Foods to avoid Grains  White bread. White pasta. White rice. Cornbread. Bagels, pastries, and croissants. Crackers that contain trans fat. Vegetables  White potatoes. Corn. Creamed or fried vegetables. Vegetables in a cheese sauce. Fruits  Dried fruits. Canned fruit in light or heavy syrup. Fruit juice. Meats and other protein foods  Fatty cuts of meat. Ribs, chicken wings, bacon, sausage, bologna, salami, chitterlings, fatback, hot dogs, bratwurst, and packaged luncheon meats. Liver and organ meats. Dairy  Whole or 2% milk, cream, half-and-half, and cream cheese. Whole milk cheeses. Whole-fat or sweetened yogurt. Full-fat cheeses. Nondairy creamers and whipped toppings. Processed cheese, cheese spreads, or cheese  curds. Beverages  Alcohol. Sweetened drinks (such as sodas, lemonade, and fruit drinks or punches). Fats and oils  Butter, stick margarine, lard, shortening, ghee, or bacon fat. Coconut, palm kernel, or palm oils. Sweets and desserts  Corn syrup, sugars, honey, and molasses. Candy. Jam and jelly. Syrup. Sweetened cereals. Cookies, pies, cakes, donuts, muffins, and ice cream. The items listed above may not be a complete list of foods and beverages to avoid. Contact your dietitian for more information. This information is not intended to replace advice given to you by your health care provider. Make sure you discuss any questions you have with your health care provider. Document Released: 03/02/2005 Document Revised: 03/23/2014 Document Reviewed: 05/31/2013 Elsevier Interactive Patient Education  2017 [employer].

## 2016-11-05 DIAGNOSIS — Z8739 Personal history of other diseases of the musculoskeletal system and connective tissue: Secondary | ICD-10-CM | POA: Insufficient documentation

## 2016-11-05 DIAGNOSIS — F4001 Agoraphobia with panic disorder: Secondary | ICD-10-CM | POA: Insufficient documentation

## 2016-12-01 ENCOUNTER — Encounter: Payer: Self-pay | Admitting: Physician Assistant

## 2016-12-01 ENCOUNTER — Ambulatory Visit (INDEPENDENT_AMBULATORY_CARE_PROVIDER_SITE_OTHER): Payer: BLUE CROSS/BLUE SHIELD | Admitting: Physician Assistant

## 2016-12-01 VITALS — BP 129/86 | HR 66 | Wt 210.0 lb

## 2016-12-01 DIAGNOSIS — E663 Overweight: Secondary | ICD-10-CM | POA: Diagnosis not present

## 2016-12-01 DIAGNOSIS — B009 Herpesviral infection, unspecified: Secondary | ICD-10-CM | POA: Diagnosis not present

## 2016-12-01 DIAGNOSIS — I2510 Atherosclerotic heart disease of native coronary artery without angina pectoris: Secondary | ICD-10-CM

## 2016-12-01 DIAGNOSIS — Z79899 Other long term (current) drug therapy: Secondary | ICD-10-CM

## 2016-12-01 DIAGNOSIS — I10 Essential (primary) hypertension: Secondary | ICD-10-CM

## 2016-12-01 DIAGNOSIS — R7303 Prediabetes: Secondary | ICD-10-CM | POA: Diagnosis not present

## 2016-12-01 DIAGNOSIS — Z5181 Encounter for therapeutic drug level monitoring: Secondary | ICD-10-CM

## 2016-12-01 MED ORDER — METFORMIN HCL ER 500 MG PO TB24: 500.0000 mg | ORAL_TABLET | Freq: Every day | ORAL | 5 refills | Status: DC

## 2016-12-01 MED ORDER — VALACYCLOVIR HCL 1 G PO TABS: 1000.0000 mg | ORAL_TABLET | Freq: Every day | ORAL | 3 refills | Status: AC

## 2016-12-01 NOTE — Progress Notes (Signed)
HPI:                                                                Jack [name] is a 46 y.o. male who presents to [name]: Primary Care Sports Medicine today for hypertension and hyperlipidemia follow-up  HTN: taking Amlodipine  and Metoprolol  daily. Compliant with medications. Checks BP's at home. BP range 112-137/79-91. Denies vision change, headache, chest pain with exertion, orthopnea, lightheadedness, syncope and edema. Risk factors include: history of CVA, metabolic syndrome, male sex  HLD: taking Atorvastatin  nightly. Reports some myalgias and fatigue, but states this has improved and he feels he is tolerating the medication  Also reports oral and genital herpes outbreaks. States he had not had an outbreak in 20 years and has had multiple outbreaks over the last few months. Requesting Valtrex.  Past Medical History:  Diagnosis Date  . Anxiety   . Gout   . High cholesterol   . Hypertension   . Stroke [facility])    Past Surgical History:  Procedure Laterality Date  . NO PAST SURGERIES     Social History  Substance Use Topics  . Smoking status: Never Smoker  . Smokeless tobacco: Never Used  . Alcohol use No   family history is not on file. He was adopted.  ROS: negative except as noted in the HPI  Medications: Current Outpatient Prescriptions  Medication Sig Dispense Refill  . allopurinol (ZYLOPRIM) 300 MG tablet Take 300 mg by mouth daily.  0  . ALPRAZolam (XANAX XR) 1 MG 24 hr tablet Take 1-2 tablets by mouth daily as needed.  2  . amLODipine (NORVASC) 5 MG tablet Take 1 tablet (5 mg total) by mouth daily. 30 tablet 5  . aspirin 325 MG tablet Take 1 tablet (325 mg total) by mouth daily.    Marland Kitchen atorvastatin (LIPITOR) 40 MG tablet Take 1 tablet (40 mg total) by mouth at bedtime. 30 tablet 5  . buPROPion (ZYBAN) 150 MG 12 hr tablet Take 150 mg by mouth daily.    . metFORMIN (GLUCOPHAGE-XR) 500 MG 24 hr tablet Take 1 tablet by mouth  daily.  2  . metoprolol (TOPROL-XL) 200 MG 24 hr tablet Take 1 tablet (200 mg total) by mouth daily. 30 tablet 5   No current facility-administered medications for this visit.    Allergies  Allergen Reactions  . Edarbi [Azilsartan] Other (See Comments)    depression  . Levaquin [Levofloxacin In D5w] Itching and Swelling  . Lisinopril Cough  . Valsartan Rash       Objective:  BP 129/86   Pulse 66   Wt 210 lb (95.3 kg)   BMI 28.48 kg/m  Gen:  alert, not ill-appearing, no distress, appropriate for age HEENT: head normocephalic without obvious abnormality, conjunctiva and cornea clear, trachea midline Pulm: Normal work of breathing, normal phonation, clear to auscultation bilaterally, no wheezes, rales or rhonchi CV: Normal rate, regular rhythm, s1 and s2 distinct, no murmurs, clicks or rubs  Neuro: alert and oriented x 3, no tremor MSK: extremities atraumatic, normal gait and station Skin: intact, no rashes on exposed skin, GU exam deferred Psych: well-groomed, cooperative, good eye contact, euthymic mood, affect mood-congruent, speech is articulate, and thought processes clear and goal-directed  No results found for this or any previous visit (from the past 72 hour(s)). No results found.   Assessment and Plan: 46 y.o. male with   1. Hypertension goal BP (blood pressure) < 130/80 BP Readings from Last 3 Encounters:  12/01/16 129/86  11/03/16 (!) 145/96  09/07/16 128/82  - improved on Atenolol, but diastolic is still above goal. Discussed options to include increasing Atenolol + lifestyle changes or aggressive lifestyle changes and keep current medication regimen. Patient opted to try weight loss and TLC - Limit salt to <1500 mg/day - Follow DASH eating plan - limit alcohol to 2 standard drinks per day - avoid tobacco products - weight loss: 7% of current body weight - Follow-up in in 3 months - COMPLETE METABOLIC PANEL WITH GFR  2. ASCVD (arteriosclerotic  cardiovascular disease) - fasting lipid panel - continue Atorvastatin  nightly. Patient unlikely to tolerate 80 mg. - cont Aspirin 325 mg  3. Encounter for monitoring statin therapy - Lipid Panel w/reflex Direct LDL  4. Overweight (BMI 25.0-29.9) - does not currently exercise or adhere to a heart-healthy diet - DASH eating plan - regular CV exercise  5. Prediabetes Lab Results  Component Value Date   HGBA1C 5.4 11/03/2016  - metFORMIN (GLUCOPHAGE-XR) 500 MG 24 hr tablet; Take 1 tablet (500 mg total) by mouth daily with breakfast.  Dispense: 30 tablet; Refill: 5  6. HSV (herpes simplex virus) infection - valACYclovir (VALTREX) 1000 MG tablet; Take 1 tablet (1,000 mg total) by mouth daily.  Dispense: 5 tablet; Refill: 3   Patient education and anticipatory guidance given Patient agrees with treatment plan Follow-up in 3 months or sooner as needed  Levonne Hubert PA-C

## 2016-12-01 NOTE — Patient Instructions (Addendum)
I have also ordered fasting labs. The lab is a walk-in open M-F 7:30a-4:30p (closed 12:30-1:30p). Nothing to eat or drink after midnight or at least 8 hours before your blood draw. You can have water and your medications.    For your blood pressure: - Continue your current medications daily - Continue to log your blood pressures at home - Limit salt to <1500 mg/day - Follow DASH eating plan - limit alcohol to 2 standard drinks per day - avoid tobacco products - weight loss: 7% of current body weight - Follow-up in in 3 months  For HSV - start Valtrex at the first sign of a breakout and within first day of the first lesion - take once daily for 5 days    DASH Eating Plan DASH stands for "Dietary Approaches to Stop Hypertension." The DASH eating plan is a healthy eating plan that has been shown to reduce high blood pressure (hypertension). It may also reduce your risk for type 2 diabetes, heart disease, and stroke. The DASH eating plan may also help with weight loss. What are tips for following this plan? General guidelines  Avoid eating more than 2,300 mg (milligrams) of salt (sodium) a day. If you have hypertension, you may need to reduce your sodium intake to 1,500 mg a day.  Limit alcohol intake to no more than 1 drink a day for nonpregnant women and 2 drinks a day for men. One drink equals 12 oz of beer, 5 oz of wine, or 1 oz of hard liquor.  Work with your health care provider to maintain a healthy body weight or to lose weight. Ask what an ideal weight is for you.  Get at least 30 minutes of exercise that causes your heart to beat faster (aerobic exercise) most days of the week. Activities may include walking, swimming, or biking.  Work with your health care provider or diet and nutrition specialist (dietitian) to adjust your eating plan to your individual calorie needs. Reading food labels  Check food labels for the amount of sodium per serving. Choose foods with less than 5  percent of the Daily Value of sodium. Generally, foods with less than 300 mg of sodium per serving fit into this eating plan.  To find whole grains, look for the word "whole" as the first word in the ingredient list. Shopping  Buy products labeled as "low-sodium" or "no salt added."  Buy fresh foods. Avoid canned foods and premade or frozen meals. Cooking  Avoid adding salt when cooking. Use salt-free seasonings or herbs instead of table salt or sea salt. Check with your health care provider or pharmacist before using salt substitutes.  Do not fry foods. Cook foods using healthy methods such as baking, boiling, grilling, and broiling instead.  Cook with heart-healthy oils, such as olive, canola, soybean, or sunflower oil. Meal planning   Eat a balanced diet that includes: ? 5 or more servings of fruits and vegetables each day. At each meal, try to fill half of your plate with fruits and vegetables. ? Up to 6-8 servings of whole grains each day. ? Less than 6 oz of lean meat, poultry, or fish each day. A 3-oz serving of meat is about the same size as a deck of cards. One egg equals 1 oz. ? 2 servings of low-fat dairy each day. ? A serving of nuts, seeds, or beans 5 times each week. ? Heart-healthy fats. Healthy fats called Omega-3 fatty acids are found in foods such as flaxseeds  and coldwater fish, like sardines, salmon, and mackerel.  Limit how much you eat of the following: ? Canned or prepackaged foods. ? Food that is high in trans fat, such as fried foods. ? Food that is high in saturated fat, such as fatty meat. ? Sweets, desserts, sugary drinks, and other foods with added sugar. ? Full-fat dairy products.  Do not salt foods before eating.  Try to eat at least 2 vegetarian meals each week.  Eat more home-cooked food and less restaurant, buffet, and fast food.  When eating at a restaurant, ask that your food be prepared with less salt or no salt, if possible. What foods are  recommended? The items listed may not be a complete list. Talk with your dietitian about what dietary choices are best for you. Grains Whole-grain or whole-wheat bread. Whole-grain or whole-wheat pasta. Brown rice. Modena Morrow. Bulgur. Whole-grain and low-sodium cereals. Pita bread. Low-fat, low-sodium crackers. Whole-wheat flour tortillas. Vegetables Fresh or frozen vegetables (raw, steamed, roasted, or grilled). Low-sodium or reduced-sodium tomato and vegetable juice. Low-sodium or reduced-sodium tomato sauce and tomato paste. Low-sodium or reduced-sodium canned vegetables. Fruits All fresh, dried, or frozen fruit. Canned fruit in natural juice (without added sugar). Meat and other protein foods Skinless chicken or [place]. Ground chicken or [place]. Pork with fat trimmed off. Fish and seafood. Egg whites. Dried beans, peas, or lentils. Unsalted nuts, nut butters, and seeds. Unsalted canned beans. Lean cuts of beef with fat trimmed off. Low-sodium, lean deli meat. Dairy Low-fat (1%) or fat-free (skim) milk. Fat-free, low-fat, or reduced-fat cheeses. Nonfat, low-sodium ricotta or cottage cheese. Low-fat or nonfat yogurt. Low-fat, low-sodium cheese. Fats and oils Soft margarine without trans fats. Vegetable oil. Low-fat, reduced-fat, or light mayonnaise and salad dressings (reduced-sodium). Canola, safflower, olive, soybean, and sunflower oils. Avocado. Seasoning and other foods Herbs. Spices. Seasoning mixes without salt. Unsalted popcorn and pretzels. Fat-free sweets. What foods are not recommended? The items listed may not be a complete list. Talk with your dietitian about what dietary choices are best for you. Grains Baked goods made with fat, such as croissants, muffins, or some breads. Dry pasta or rice meal packs. Vegetables Creamed or fried vegetables. Vegetables in a cheese sauce. Regular canned vegetables (not low-sodium or reduced-sodium). Regular canned tomato sauce and paste (not  low-sodium or reduced-sodium). Regular tomato and vegetable juice (not low-sodium or reduced-sodium). Jack [name]. Olives. Fruits Canned fruit in a light or heavy syrup. Fried fruit. Fruit in cream or butter sauce. Meat and other protein foods Fatty cuts of meat. Ribs. Fried meat. Jack [name]. Sausage. Bologna and other processed lunch meats. Salami. Fatback. Hotdogs. Bratwurst. Salted nuts and seeds. Canned beans with added salt. Canned or smoked fish. Whole eggs or egg yolks. Chicken or [place] with skin. Dairy Whole or 2% milk, cream, and half-and-half. Whole or full-fat cream cheese. Whole-fat or sweetened yogurt. Full-fat cheese. Nondairy creamers. Whipped toppings. Processed cheese and cheese spreads. Fats and oils Butter. Stick margarine. Lard. Shortening. Ghee. Bacon fat. Tropical oils, such as coconut, palm kernel, or palm oil. Seasoning and other foods Salted popcorn and pretzels. Onion salt, garlic salt, seasoned salt, table salt, and sea salt. Worcestershire sauce. Tartar sauce. Barbecue sauce. Teriyaki sauce. Soy sauce, including reduced-sodium. Steak sauce. Canned and packaged gravies. Fish sauce. Oyster sauce. Cocktail sauce. Horseradish that you find on the shelf. Ketchup. Mustard. Meat flavorings and tenderizers. Bouillon cubes. Hot sauce and Tabasco sauce. Premade or packaged marinades. Premade or packaged taco seasonings. Relishes. Regular salad dressings. Where to  find more information:  National Heart, Lung, and Blood Institute: PopSteam.is  American Heart Association: www.heart.org Summary  The DASH eating plan is a healthy eating plan that has been shown to reduce high blood pressure (hypertension). It may also reduce your risk for type 2 diabetes, heart disease, and stroke.  With the DASH eating plan, you should limit salt (sodium) intake to 2,300 mg a day. If you have hypertension, you may need to reduce your sodium intake to 1,500 mg a day.  When on the DASH eating plan,  aim to eat more fresh fruits and vegetables, whole grains, lean proteins, low-fat dairy, and heart-healthy fats.  Work with your health care provider or diet and nutrition specialist (dietitian) to adjust your eating plan to your individual calorie needs. This information is not intended to replace advice given to you by your health care provider. Make sure you discuss any questions you have with your health care provider. Document Released: 02/19/2011 Document Revised: 02/24/2016 Document Reviewed: 02/24/2016 Elsevier Interactive Patient Education  2017 [employer].

## 2016-12-03 DIAGNOSIS — I10 Essential (primary) hypertension: Secondary | ICD-10-CM | POA: Diagnosis not present

## 2016-12-03 DIAGNOSIS — Z5181 Encounter for therapeutic drug level monitoring: Secondary | ICD-10-CM | POA: Diagnosis not present

## 2016-12-03 DIAGNOSIS — Z79899 Other long term (current) drug therapy: Secondary | ICD-10-CM | POA: Diagnosis not present

## 2016-12-03 LAB — COMPLETE METABOLIC PANEL WITH GFR
AG Ratio: 1.7 (calc) (ref 1.0–2.5)
ALBUMIN MSPROF: 4.4 g/dL (ref 3.6–5.1)
ALT: 22 U/L (ref 9–46)
AST: 22 U/L (ref 10–40)
Alkaline phosphatase (APISO): 66 U/L (ref 40–115)
BILIRUBIN TOTAL: 0.7 mg/dL (ref 0.2–1.2)
BUN: 13 mg/dL (ref 7–25)
CALCIUM: 9.4 mg/dL (ref 8.6–10.3)
CHLORIDE: 104 mmol/L (ref 98–110)
CO2: 28 mmol/L (ref 20–32)
CREATININE: 0.91 mg/dL (ref 0.60–1.35)
GFR, EST AFRICAN AMERICAN: 116 mL/min/{1.73_m2} (ref >=60)
GFR, EST NON AFRICAN AMERICAN: 100 mL/min/{1.73_m2} (ref >=60)
GLUCOSE: 116 mg/dL — AB (ref 65–99)
Globulin: 2.6 g/dL (calc) (ref 1.9–3.7)
Potassium: 4.2 mmol/L (ref 3.5–5.3)
Sodium: 139 mmol/L (ref 135–146)
TOTAL PROTEIN: 7 g/dL (ref 6.1–8.1)

## 2016-12-03 LAB — LIPID PANEL W/REFLEX DIRECT LDL
CHOL/HDL RATIO: 4.5 (calc) (ref <5.0)
Cholesterol: 161 mg/dL (ref <200)
HDL: 36 mg/dL — ABNORMAL LOW (ref >40)
LDL CHOLESTEROL (CALC): 99 mg/dL
NON-HDL CHOLESTEROL (CALC): 125 mg/dL (ref <130)
TRIGLYCERIDES: 164 mg/dL — AB (ref <150)

## 2016-12-03 NOTE — Progress Notes (Signed)
Cholesterol is much better Continue Atorvastatin nightly and low-cholesterol diet

## 2016-12-09 DIAGNOSIS — F4010 Social phobia, unspecified: Secondary | ICD-10-CM | POA: Diagnosis not present

## 2016-12-09 DIAGNOSIS — F408 Other phobic anxiety disorders: Secondary | ICD-10-CM | POA: Diagnosis not present

## 2016-12-09 DIAGNOSIS — F411 Generalized anxiety disorder: Secondary | ICD-10-CM | POA: Diagnosis not present

## 2016-12-14 DIAGNOSIS — Z79899 Other long term (current) drug therapy: Secondary | ICD-10-CM | POA: Diagnosis not present

## 2016-12-14 DIAGNOSIS — M1009 Idiopathic gout, multiple sites: Secondary | ICD-10-CM | POA: Diagnosis not present

## 2016-12-14 DIAGNOSIS — M2550 Pain in unspecified joint: Secondary | ICD-10-CM | POA: Diagnosis not present

## 2016-12-14 DIAGNOSIS — K760 Fatty (change of) liver, not elsewhere classified: Secondary | ICD-10-CM | POA: Diagnosis not present

## 2017-02-15 ENCOUNTER — Encounter: Payer: Self-pay | Admitting: Neurology

## 2017-02-15 ENCOUNTER — Ambulatory Visit: Payer: BLUE CROSS/BLUE SHIELD | Admitting: Neurology

## 2017-02-15 VITALS — BP 142/96 | HR 67 | Ht 72.0 in | Wt 212.6 lb

## 2017-02-15 DIAGNOSIS — I639 Cerebral infarction, unspecified: Secondary | ICD-10-CM | POA: Diagnosis not present

## 2017-02-15 DIAGNOSIS — I69359 Hemiplegia and hemiparesis following cerebral infarction affecting unspecified side: Secondary | ICD-10-CM | POA: Diagnosis not present

## 2017-02-15 DIAGNOSIS — Q048 Other specified congenital malformations of brain: Secondary | ICD-10-CM

## 2017-02-15 DIAGNOSIS — G379 Demyelinating disease of central nervous system, unspecified: Secondary | ICD-10-CM | POA: Diagnosis not present

## 2017-02-15 NOTE — Patient Instructions (Signed)
MRI of the brain w/wo contrast 

## 2017-02-15 NOTE — Progress Notes (Signed)
[facility]    Provider:  Dr Lucia GaskinsAhern Referring Provider: No ref. provider found Primary Care Physician:  Carlis StableCummings, Charley Elizabeth, PA-C   Interval history 02/15/2017: Patient returns today for follow-up on subacute stroke involving the posterior limb of the left internal capsule and basal ganglia, could not rule out demyelinating lesion, recommended follow-up MRI of the brain in 6 months with and without contrast. He feels much better. He has Klonopin for anxiety. He follows closely with pcp for vascular risk factors. He has gained a little weight and is trying to improve on that. He is exercising. He is compliant with meds and taking daily asa. He still has weakness of the right side and still stumbles with words but still improving. Has not had any auras, no migraines since the stroke. Discussed magnesium and aura and if they come back to look into that.   CC:  Interval history 09/08/2016:   Patient returns today to review MRI results as below which did show subchronic stroke involving the posterior limb of the left internal capsule and basal ganglia. I reviewed images with patient and we discussed the lesion which I do believe is a stroke however cannot rule out a demyelinating plaque at this point I think it would be wise to rescan patient 6 months and follow the evolution. Next MRI should be done with and without contrast.  MRI brain(personally reviewed images): 08/28/2016: This MRI of the brain without contrast shows a sub-chronic stroke involving the posterior limb of the left internal capsule and adjacent basal ganglia. There has been expected evolution since the CT scan dated 07/03/2016 where there was mild hypodensity in that region consistent with a more acute stroke at that time. The brain is otherwise normal for age.  XBJ:YNWGNFAHPI:Keivon T Kirkmanis a 46 y.o.malehere as a referral from Dr. Josephina GipXufor follow-up after hospital admission. Per review of records, patient has  a history of hypertension and anxiety, severe category phobia and panic and presented with blurred vision and slurred speech, intermittent visual droop and right-sided weakness on 07/02/2016. He had not been on antiplatelet therapy and blood pressure was markedly elevated. He was started on a Cardene drip. NA stroke scale was 4. His deficits were rapidly resolving and he was not given TPA. Patient was admitted for workup. Patient experienced a third episode of weakness and right hemiparesis side onset at 4 AM and at that time and AH stroke scale was 6 and at that time TPA was administered. Neurologic exam showed mild drift of right extremities otherwise unremarkable. Patient improved while in the hospital. He was seen by the stroke team who noted mild dysarthria, mild right lower facial weakness, mild right hemiparesis.Diagnosed with likely left MCA infarct however patient refused MRI. CT of the head was negative for strokes. 2-D echo showed normal ejection fraction and no source of embolus. LDL was 239, hemoglobin A1c 6.1, HIV negative. Blood pressures were as high as 182/111.  He feels 80% recovered. Speech is better and strength is betterr. He had PT, OT and speech therapy. He has some pain in the left occipital area the last few days. He could not tolerate the stroke. He found a primary care and he was started on metoprolol and his BP is well controlled. He has OCD and agoraphobia so needs an open MRI.   Reviewed notes, labs and imaging from outside physicians, which showed:  Reviewed extensive lab history is unremarkable including CBC, sedimentation rate, CRP, ANA, anti-DNA antibody, alpha galactosidase, cardiolipin antibodies, homocystine,  beta-2 glycoprotein, lupus anticoagulant, hiv. Hemoglobin A1c was 6.1 and LDL was 239. BMP was essentially normal with elevated glucose 118.  CT head showed No acute intracranial abnormalities including mass lesion or mass effect, hydrocephalus, extra-axial  fluid collection, midline shift, hemorrhage, or acute infarction, large ischemic events (personally reviewed images)  Reviewed report CT angiogram of the head and angiogram the neck which were unremarkable for any significant stenosis mild atheromatous plaque within the carotid siphons without significant stenosis. However it did show emphysema.He lost 20 pounds.    Review of Systems: Patient complains of symptoms per HPI as well as the following symptoms: ringing in ears. Pertinent negatives and positives per HPI. All others negative.     Social History   Socioeconomic History  . Marital status: Single    Spouse name: Not on file  . Number of children: 0  . Years of education: Some college  . Highest education level: Not on file  Social Needs  . Financial resource strain: Not on file  . Food insecurity - worry: Not on file  . Food insecurity - inability: Not on file  . Transportation needs - medical: Not on file  . Transportation needs - non-medical: Not on file  Occupational History  . Occupation: [employer]  Tobacco Use  . Smoking status: Never Smoker  . Smokeless tobacco: Never Used  Substance and Sexual Activity  . Alcohol use: No  . Drug use: No  . Sexual activity: Yes  Other Topics Concern  . Not on file  Social History Narrative   Lives w/ his partner   Right-handed   Caffeine: none    Family History  Adopted: Yes  Problem Relation Age of Onset  . Stroke Neg Hx     Past Medical History:  Diagnosis Date  . Anxiety   . Gout   . High cholesterol   . Hypertension   . Stroke [facility](HCC)     Past Surgical History:  Procedure Laterality Date  . NO PAST SURGERIES      Current Outpatient Medications  Medication Sig Dispense Refill  . allopurinol (ZYLOPRIM) 300 MG tablet Take 300 mg by mouth daily.  0  . amLODipine (NORVASC) 5 MG tablet Take 1 tablet (5 mg total) by mouth daily. 30 tablet 5  . aspirin 325 MG tablet Take 1 tablet (325 mg total) by mouth  daily.    Marland Kitchen. atorvastatin (LIPITOR) 40 MG tablet Take 1 tablet (40 mg total) by mouth at bedtime. 30 tablet 5  . buPROPion (WELLBUTRIN XL) 150 MG 24 hr tablet Take 150 mg by mouth daily.    . clonazePAM (KLONOPIN) 0.5 MG tablet Take 0.5 mg by mouth 3 (three) times daily.    . metFORMIN (GLUCOPHAGE-XR) 500 MG 24 hr tablet Take 1 tablet (500 mg total) by mouth daily with breakfast. 30 tablet 5  . metoprolol (TOPROL-XL) 200 MG 24 hr tablet Take 1 tablet (200 mg total) by mouth daily. 30 tablet 5   No current facility-administered medications for this visit.     Allergies as of 02/15/2017 - Review Complete 02/15/2017  Allergen Reaction Noted  . Edarbi [azilsartan] Other (See Comments) 11/03/2016  . Levaquin [levofloxacin in d5w] Itching and Swelling 04/26/2015  . Lisinopril Cough 11/03/2016  . Valsartan Rash 11/03/2016    Vitals: BP (!) 142/96 (BP Location: Right Arm, Patient Position: Sitting) Comment: anxious; he states its usually close to normal  Pulse 67   Ht 6' (1.829 m)   Wt 212 lb  9.6 oz (96.4 kg)   BMI 28.83 kg/m  Last Weight:  Wt Readings from Last 1 Encounters:  02/15/17 212 lb 9.6 oz (96.4 kg)   Last Height:   Ht Readings from Last 1 Encounters:  02/15/17 6' (1.829 m)       Physical exam: Exam: Gen: NAD, conversant, well nourised, well groomed  CV: RRR, no MRG. No Carotid Bruits. No peripheral edema, warm, nontender Eyes: Conjunctivae clear without exudates or hemorrhage  Neuro: Detailed Neurologic Exam  Speech: Speech is mildly dysarthric but understandable without issue; fluent and spontaneous with normal comprehension.  Cognition: The patient is oriented to person, place, and time;  recent and remote memory intact;  language fluent;  normal attention, concentration,  fund of knowledge Cranial Nerves: The pupils are equal, round, and reactive to light. The fundi are normal and spontaneous venous pulsations are  present. Visual fields are full to finger confrontation. Extraocular movements are intact. Trigeminal sensation is intact and the muscles of mastication are normal. Right NL flattening. The palate elevates in the midline. Hearing intact. Voice is normal. Shoulder shrug is normal. The tongue has normal motion without fasciculations.   Coordination: No dysmetria. Decreased fine motor in the right hand.  Gait: Not ataxic  Motor Observation: No asymmetry, no atrophy, and no involuntary movements noted. Tone: Normal muscle tone.   Posture: Posture is normal. normal erect  Strength: Minimal prox weakness right extremities otherwise strength is V/V in the upper and lower limbs.   Sensation: intact to LT  Reflex Exam:  DTR's: Deep tendon reflexes in the upper and lower extremities are normal bilaterally.  Toes: The toes are downgoing bilaterally.  Clonus: Clonus is absent.     Assessment/Plan:This is a patient who presented to [facility]2016 with stuttering symptoms of dysarthria, mild right-sided weakness and mild right facial droop. CTs of the head were negative. He was given IV TPA. Patient's LDL was 239 and his blood pressure was significantly elevated up to 189/111 and he was treated for both of these conditions. He was not on antiplatelet therapy prior to incident. Diagnosis was likely left MCA stroke however patient could not tolerate MRIs.  - MRI results h did show subchronic stroke involving the posterior limb of the left internal capsule and basal ganglia. I reviewed images with patient and we discussed the lesion which I do believe is a stroke however cannot rule out a demyelinating plaque at this point I think it would be wise to rescan patient 6 months and follow the evolution. Next MRI should be done with and without contrast. Discussed Lp will hold off for now. Will reorder MRI brain w/wo contrast.   He is doing  well, his blood pressure is well-controlled, he is exercising and has lost 20 pounds, he is compliant with his medications. He could not tolerate MRI of the brain in the hospital and I think this would be very important to exam in the distribution of the stroke and possibly further testing for example if embolic appearing would recommend TEE and loop. Since he has anxiety and agoraphobia phobia will order open MRI of the brain.  I had a long d/w patient about his recent stroke, risk for recurrent stroke/TIAs, personally independently reviewed imaging studies and stroke evaluation results and answered questions.Continue ASA 325mg  for secondary stroke prevention and maintain strict control of hypertension with blood pressure goal below 130/90, diabetes with hemoglobin A1c goal below 6.5% and lipids with LDL cholesterol goal below 70  mg/dL.  Mild emphysema was seen incidentally on CT imaging of the blood vessels in the neck. I discussed this with patient, gave him a copy of the report and asked him to follow-up with primary care on this and possibly primary care will send him to pulmonology to further explore if he has emphysema, the causes and treatment.  Orders Placed This Encounter  Procedures  . MR BRAIN W WO CONTRAST     Naomie Dean, MD  [facility] [address] [place], [place] [address]  Phone [phone] Fax [phone]  A total of 15 minutes was spent face-to-face with this patient. Over half this time was spent on counseling patient on the stroke diagnosis and different diagnostic and therapeutic options available.

## 2017-02-16 ENCOUNTER — Telehealth: Payer: Self-pay | Admitting: *Deleted

## 2017-02-16 ENCOUNTER — Telehealth: Payer: Self-pay | Admitting: Neurology

## 2017-02-16 LAB — BASIC METABOLIC PANEL
BUN / CREAT RATIO: 13 (ref 9–20)
BUN: 13 mg/dL (ref 6–24)
CO2: 22 mmol/L (ref 20–29)
Calcium: 9.9 mg/dL (ref 8.7–10.2)
Chloride: 101 mmol/L (ref 96–106)
Creatinine, Ser: 1.03 mg/dL (ref 0.76–1.27)
GFR, EST AFRICAN AMERICAN: 100 mL/min/{1.73_m2} (ref >59)
GFR, EST NON AFRICAN AMERICAN: 86 mL/min/{1.73_m2} (ref >59)
Glucose: 80 mg/dL (ref 65–99)
POTASSIUM: 4.3 mmol/L (ref 3.5–5.2)
SODIUM: 142 mmol/L (ref 134–144)

## 2017-02-16 NOTE — Telephone Encounter (Addendum)
Called patient and LVM (ok per DPR) informing patient that he left his receipt at his office visit yesterday and that we could either mail it to him or he could pick it up at the front desk. Asked for a call back to let us know what he would like for us to do.   Please also tell him that his labs are normal.

## 2017-02-16 NOTE — Telephone Encounter (Signed)
Pt returned Rn's call. He is wanting receipt mailed. He was appreciative for lab results.

## 2017-02-16 NOTE — Telephone Encounter (Signed)
Receipt placed in enveloped & sent to medical records to be mailed to patient.

## 2017-02-16 NOTE — Telephone Encounter (Signed)
Due to the patient wanted an open MRI I faxed the order to [facility].

## 2017-02-17 ENCOUNTER — Encounter: Payer: Self-pay | Admitting: Neurology

## 2017-02-17 DIAGNOSIS — F4010 Social phobia, unspecified: Secondary | ICD-10-CM | POA: Diagnosis not present

## 2017-02-17 DIAGNOSIS — F408 Other phobic anxiety disorders: Secondary | ICD-10-CM | POA: Diagnosis not present

## 2017-02-17 DIAGNOSIS — F411 Generalized anxiety disorder: Secondary | ICD-10-CM | POA: Diagnosis not present

## 2017-02-24 ENCOUNTER — Ambulatory Visit: Payer: BLUE CROSS/BLUE SHIELD | Admitting: Physician Assistant

## 2017-02-24 ENCOUNTER — Encounter: Payer: Self-pay | Admitting: Physician Assistant

## 2017-02-24 VITALS — BP 141/85 | HR 65 | Wt 215.0 lb

## 2017-02-24 DIAGNOSIS — Z113 Encounter for screening for infections with a predominantly sexual mode of transmission: Secondary | ICD-10-CM

## 2017-02-24 DIAGNOSIS — E782 Mixed hyperlipidemia: Secondary | ICD-10-CM

## 2017-02-24 DIAGNOSIS — Z79899 Other long term (current) drug therapy: Secondary | ICD-10-CM

## 2017-02-24 DIAGNOSIS — I10 Essential (primary) hypertension: Secondary | ICD-10-CM

## 2017-02-24 DIAGNOSIS — I2510 Atherosclerotic heart disease of native coronary artery without angina pectoris: Secondary | ICD-10-CM | POA: Diagnosis not present

## 2017-02-24 DIAGNOSIS — Z5181 Encounter for therapeutic drug level monitoring: Secondary | ICD-10-CM | POA: Diagnosis not present

## 2017-02-24 MED ORDER — AMLODIPINE BESYLATE 10 MG PO TABS: 10.0000 mg | ORAL_TABLET | Freq: Every day | ORAL | 5 refills | Status: DC

## 2017-02-24 NOTE — Progress Notes (Signed)
HPI:                                                                Jack [name] is a 46 y.o. male who [facility] today for hypertension follow-up  HTN: taking Amlodipine and Metoprolol daily. Compliant with medications. Checks BP's at home. BP range 140's/90's. He has been exercising regularly, and reports gaining weight. Denies vision change, headache, chest pain with exertion, orthopnea, lightheadedness, syncope and edema. Risk factors include: history of subacute stroke, male sex, HLD  HLD: taking Atorvastatin 40mg  nightly. Compliant with medication. Reports he has not had any myalgias for the last 2 months.  Hx of CVA: followed by Dr. Lucia GaskinsAhern, Neurology. Taking aspirin daily for secondary prevention. He has a follow-up MRI scheduled.   Past Medical History:  Diagnosis Date  . Anxiety   . Gout   . High cholesterol   . Hypertension   . Stroke [facility](HCC)    Past Surgical History:  Procedure Laterality Date  . NO PAST SURGERIES     Social History   Tobacco Use  . Smoking status: Never Smoker  . Smokeless tobacco: Never Used  Substance Use Topics  . Alcohol use: No   family history is not on file. He was adopted.  ROS: negative except as noted in the HPI  Medications: Current Outpatient Medications  Medication Sig Dispense Refill  . allopurinol (ZYLOPRIM) 300 MG tablet Take 300 mg by mouth daily.  0  . aspirin 325 MG tablet Take 1 tablet (325 mg total) by mouth daily.    Marland Kitchen. atorvastatin (LIPITOR) 40 MG tablet Take 1 tablet (40 mg total) by mouth at bedtime. 30 tablet 5  . buPROPion (WELLBUTRIN XL) 150 MG 24 hr tablet Take 150 mg by mouth daily.    . clonazePAM (KLONOPIN) 1 MG tablet Take 1 mg by mouth 2 (two) times daily.    . CO ENZYME Q-10 PO Take by mouth.    . metFORMIN (GLUCOPHAGE-XR) 500 MG 24 hr tablet Take 1 tablet (500 mg total) by mouth daily with breakfast. 30 tablet 5  . metoprolol (TOPROL-XL) 200 MG  24 hr tablet Take 1 tablet (200 mg total) by mouth daily. 30 tablet 5  . amLODipine (NORVASC) 10 MG tablet Take 1 tablet (10 mg total) by mouth daily. 30 tablet 5   No current facility-administered medications for this visit.    Allergies  Allergen Reactions  . Edarbi [Azilsartan] Other (See Comments)    depression  . Levaquin [Levofloxacin In D5w] Itching and Swelling  . Lisinopril Cough  . Valsartan Rash       Objective:  BP (!) 141/85   Pulse 65   Wt 215 lb (97.5 kg)   SpO2 98%   BMI 29.16 kg/m  Gen:  alert, not ill-appearing, no distress, appropriate for age, overweight male HEENT: head normocephalic without obvious abnormality, conjunctiva and cornea clear, trachea midline Pulm: Normal work of breathing, normal phonation, clear to auscultation bilaterally, no wheezes, rales or rhonchi CV: Normal rate, regular rhythm, s1 and s2 distinct, no murmurs, clicks or rubs, no carotid bruit  Neuro: alert and oriented x 3, no tremor MSK: extremities atraumatic, normal gait and station, no peripheral edema Skin: intact,  no rashes on exposed skin, no jaundice, no cyanosis Psych: well-groomed, cooperative, good eye contact, euthymic mood, affect mood-congruent, speech is articulate, and thought processes clear and goal-directed   No results found for this or any previous visit (from the past 72 hour(s)). No results found.    Assessment and Plan: 46 y.o. male with   1. Encounter for medication management   2. Hypertension goal BP (blood pressure) < 130/80 BP Readings from Last 3 Encounters:  02/24/17 (!) 141/85  02/15/17 (!) 142/96  12/01/16 129/86  - BP continues to be elevated despite aggressive lifestyle changes. Increasing Amlodipine to 10mg  daily. Continue Metoprolol - amLODipine (NORVASC) 10 MG tablet; Take 1 tablet (10 mg total) by mouth daily.  Dispense: 30 tablet; Refill: 5  3. ASCVD (arteriosclerotic cardiovascular disease) - recheck fasting lipid panel  4.  Mixed hyperlipidemia - per Neuro, LDL goal <70. Last LDL 99, 3 months ago. He has had myalgias with high intensity statin therapy in the past. Recheck today. If elevated, will trial 80mg . If not tolerated, may be a candidate for PCSK9 inhibitor - Lipid Panel w/reflex Direct LDL  5. Encounter for monitoring statin therapy - Lipid Panel w/reflex Direct LDL  6. Routine screening for STI (sexually transmitted infection) - Hepatitis C antibody - HIV antibody - RPR - C. trachomatis/N. gonorrhoeae RNA   Patient education and anticipatory guidance given Patient agrees with treatment plan Follow-up in 6 months for medication management as needed if symptoms worsen or fail to improve  Levonne Hubertharley E. Cummings PA-C

## 2017-02-24 NOTE — Patient Instructions (Signed)
Physical Activity Recommendations for modifying lipids and lowering blood pressure Engage in aerobic physical activity to reduce LDL-cholesterol, non-HDL-cholesterol, and blood pressure  Frequency: 3-4 sessions per week  Intensity: moderate to vigorous  Duration: 40 minutes on average  Physical Activity Recommendations for secondary prevention 1. Aerobic exercise  Frequency: 3-5 sessions per week  Intensity: 50-80% capacity  Duration: 20 - 60 minutes  Examples: walking, treadmill, cycling, rowing, stair climbing, and arm/leg ergometry  2. Resistance exercise  Frequency: 2-3 sessions per week  Intensity: 10-15 repetitions/set to moderate fatigue  Duration: 1-3 sets of 8-10 upper and lower body exercises  Examples: calisthenics, elastic bands, cuff/hand weights, dumbbels, free weights, wall pulleys, and weight machines  Heart-Healthy Lifestyle  Eating a diet rich in vegetables, fruits and whole grains: also includes low-fat dairy products, poultry, fish, legumes, and nuts; limit intake of sweets, sugar-sweetened beverages and red meats  Getting regular exercise  Maintaining a healthy weight  Not smoking or getting help quitting  Staying on top of your health; for some people, lifestyle changes alone may not be enough to prevent a heart attack or stroke. In these cases, taking a statin at the right dose will most likely be necessary  

## 2017-02-25 ENCOUNTER — Encounter: Payer: Self-pay | Admitting: Physician Assistant

## 2017-02-25 LAB — LIPID PANEL W/REFLEX DIRECT LDL
Cholesterol: 186 mg/dL (ref <200)
HDL: 40 mg/dL — AB (ref >40)
LDL Cholesterol (Calc): 113 mg/dL (calc) — ABNORMAL HIGH
NON-HDL CHOLESTEROL (CALC): 146 mg/dL — AB (ref <130)
Total CHOL/HDL Ratio: 4.7 (calc) (ref <5.0)
Triglycerides: 209 mg/dL — ABNORMAL HIGH (ref <150)

## 2017-02-25 LAB — HEPATITIS C ANTIBODY
Hepatitis C Ab: NONREACTIVE
SIGNAL TO CUT-OFF: 0.01 (ref <1.00)

## 2017-02-25 LAB — RPR: RPR: NONREACTIVE

## 2017-02-25 LAB — C. TRACHOMATIS/N. GONORRHOEAE RNA
C. TRACHOMATIS RNA, TMA: NOT DETECTED
N. GONORRHOEAE RNA, TMA: NOT DETECTED

## 2017-02-25 LAB — HIV ANTIBODY (ROUTINE TESTING W REFLEX): HIV: NONREACTIVE

## 2017-02-25 MED ORDER — ATORVASTATIN CALCIUM 40 MG PO TABS: 40.0000 mg | ORAL_TABLET | Freq: Two times a day (BID) | ORAL | 5 refills | Status: DC

## 2017-02-25 NOTE — Progress Notes (Signed)
Good afternoon Jack [name],  Your STI screening was negative.  Your LDL cholesterol is slightly worse than 2 months ago. I recommend we try increasing your Atorvastatin to 80mg , 40mg  twice a day to get your to a goal of less than 70. Let's recheck your labs in 8 weeks. Continue to work on heart healthy, low cholesterol diet and regular aerobic exercise.  Best, Vinetta Bergamoharley

## 2017-03-01 ENCOUNTER — Encounter: Payer: Self-pay | Admitting: Emergency Medicine

## 2017-03-02 ENCOUNTER — Ambulatory Visit: Payer: Self-pay | Admitting: Physician Assistant

## 2017-03-04 ENCOUNTER — Telehealth: Payer: Self-pay | Admitting: *Deleted

## 2017-03-04 DIAGNOSIS — I2510 Atherosclerotic heart disease of native coronary artery without angina pectoris: Secondary | ICD-10-CM

## 2017-03-04 DIAGNOSIS — E782 Mixed hyperlipidemia: Secondary | ICD-10-CM

## 2017-03-04 NOTE — Telephone Encounter (Signed)
Received a message from the pharmacy stating the insurance will only cover 1.5 tablets daily of the atorvastatin 40 mg. They asked to please consider changing.

## 2017-03-10 ENCOUNTER — Ambulatory Visit
Admission: RE | Admit: 2017-03-10 | Discharge: 2017-03-10 | Disposition: A | Discharge status: Home / Self Care | Payer: BLUE CROSS/BLUE SHIELD | Source: Ambulatory Visit | Attending: Neurology | Admitting: Neurology

## 2017-03-10 DIAGNOSIS — G379 Demyelinating disease of central nervous system, unspecified: Secondary | ICD-10-CM

## 2017-03-10 DIAGNOSIS — Q048 Other specified congenital malformations of brain: Secondary | ICD-10-CM

## 2017-03-10 DIAGNOSIS — I69359 Hemiplegia and hemiparesis following cerebral infarction affecting unspecified side: Secondary | ICD-10-CM | POA: Diagnosis not present

## 2017-03-10 MED ORDER — GADOBENATE DIMEGLUMINE 529 MG/ML IV SOLN
20.0000 mL | Freq: Once | INTRAVENOUS | Status: AC | PRN
  Administered 2017-03-10: 20 mL via INTRAVENOUS

## 2017-03-10 MED ORDER — ATORVASTATIN CALCIUM 40 MG PO TABS: 60.0000 mg | ORAL_TABLET | Freq: Every day | ORAL | 5 refills | Status: DC

## 2017-03-12 ENCOUNTER — Telehealth: Payer: Self-pay | Admitting: *Deleted

## 2017-03-12 NOTE — Telephone Encounter (Addendum)
Called and spoke with patient. I informed him of his MRI results indicating that his stroke has improved and reduced in size since his last MRI. No concern for any other type of lesion or MS. He was very appreciative of the call and had no questions.  ----- Message from Anson FretAntonia B Ahern, MD sent at 03/12/2017  9:17 AM EST ----- his stroke has improved and reduced in size since last MRI. No concern for any other type of lesion or MS. Great news. Happy new year.

## 2017-04-09 ENCOUNTER — Encounter: Payer: Self-pay | Admitting: Physician Assistant

## 2017-04-09 ENCOUNTER — Ambulatory Visit: Payer: BLUE CROSS/BLUE SHIELD | Admitting: Physician Assistant

## 2017-04-09 VITALS — BP 138/90 | HR 84 | Wt 211.0 lb

## 2017-04-09 DIAGNOSIS — I2510 Atherosclerotic heart disease of native coronary artery without angina pectoris: Secondary | ICD-10-CM

## 2017-04-09 DIAGNOSIS — F422 Mixed obsessional thoughts and acts: Secondary | ICD-10-CM | POA: Insufficient documentation

## 2017-04-09 DIAGNOSIS — E785 Hyperlipidemia, unspecified: Secondary | ICD-10-CM | POA: Diagnosis not present

## 2017-04-09 DIAGNOSIS — Z8739 Personal history of other diseases of the musculoskeletal system and connective tissue: Secondary | ICD-10-CM

## 2017-04-09 DIAGNOSIS — F39 Unspecified mood [affective] disorder: Secondary | ICD-10-CM | POA: Diagnosis not present

## 2017-04-09 DIAGNOSIS — F4001 Agoraphobia with panic disorder: Secondary | ICD-10-CM

## 2017-04-09 DIAGNOSIS — Z79899 Other long term (current) drug therapy: Secondary | ICD-10-CM

## 2017-04-09 MED ORDER — ATORVASTATIN CALCIUM 40 MG PO TABS: 40.0000 mg | ORAL_TABLET | Freq: Two times a day (BID) | ORAL | 5 refills | Status: DC

## 2017-04-09 MED ORDER — BUPROPION HCL ER (XL) 150 MG PO TB24: 150.0000 mg | ORAL_TABLET | Freq: Every day | ORAL | 5 refills | Status: DC

## 2017-04-09 MED ORDER — SERTRALINE HCL 25 MG PO TABS
25.0000 mg | ORAL_TABLET | Freq: Every day | ORAL | 5 refills | Status: DC
Start: 2017-04-09 — End: 2017-06-16

## 2017-04-09 NOTE — Progress Notes (Signed)
HPI:                                                                Jack [name] is a 47 y.o. male who presents to [name]: Primary Care [facility] Medicine today for medication management  Jack [name] would like me to take over management of some of his long-term prescriptions.  Anxiety: patient has been [facility] by the [facility] with agoraphobia, OCD and depression. He currently takes Wellbutrin XL 150 mg daily and Klonopin 1mg  twice a day. States the Klonopin has been the only medication that has ever helped with his agoraphobia. He has tried tapering off in the past, but symptoms were poorly controlled. Reports current agoraphobia level is 6.5/10. He is interested in adding Zoloft to help with his OCD symptoms.  Gout: would also like me to manage his Allopurinol. He takes 300 mg daily.  Depression screen [facility] 2/9 04/09/2017 11/03/2016  Decreased Interest 0 0  Down, Depressed, Hopeless 0 0  PHQ - 2 Score 0 0  Altered sleeping 1 -  Tired, decreased energy 0 -  Change in appetite 0 -  Feeling bad or failure about yourself  0 -  Trouble concentrating 0 -  Moving slowly or fidgety/restless 0 -  Suicidal thoughts 0 -  PHQ-9 Score 1 -    GAD 7 : Generalized Anxiety Score 04/09/2017  Nervous, Anxious, on Edge 3  Control/stop worrying 3  Worry too much - different things 3  Trouble relaxing 3  Restless 2  Easily annoyed or irritable 2  Afraid - awful might happen 3  Total GAD 7 Score 19      Past Medical History:  Diagnosis Date  . Anxiety   . Gout   . High cholesterol   . Hypertension   . Stroke [facility])    Past Surgical History:  Procedure Laterality Date  . NO PAST SURGERIES     Social History   Tobacco Use  . Smoking status: Never Smoker  . Smokeless tobacco: Never Used  Substance Use Topics  . Alcohol use: No   family history is not on file. He was adopted.    ROS: negative except as noted in the HPI  Medications: Current  Outpatient Medications  Medication Sig Dispense Refill  . allopurinol (ZYLOPRIM) 300 MG tablet Take 300 mg by mouth daily.  0  . amLODipine (NORVASC) 10 MG tablet Take 1 tablet (10 mg total) by mouth daily. 30 tablet 5  . aspirin 325 MG tablet Take 1 tablet (325 mg total) by mouth daily.    Marland Kitchen atorvastatin (LIPITOR) 40 MG tablet Take 1.5 tablets (60 mg total) by mouth at bedtime. (Patient taking differently: Take 40 mg by mouth 2 (two) times daily. ) 45 tablet 5  . buPROPion (WELLBUTRIN XL) 150 MG 24 hr tablet Take 1 tablet (150 mg total) by mouth daily. 30 tablet 5  . clonazePAM (KLONOPIN) 1 MG tablet Take 1 mg by mouth 2 (two) times daily.    . CO ENZYME Q-10 PO Take by mouth.    . metFORMIN (GLUCOPHAGE-XR) 500 MG 24 hr tablet Take 1 tablet (500 mg total) by mouth daily with breakfast. 30 tablet 5  . metoprolol (TOPROL-XL) 200 MG 24 hr tablet  Take 1 tablet (200 mg total) by mouth daily. 30 tablet 5  . sertraline (ZOLOFT) 25 MG tablet Take 1 tablet (25 mg total) by mouth daily. 30 tablet 5   No current facility-administered medications for this visit.    Allergies  Allergen Reactions  . Edarbi [Azilsartan] Other (See Comments)    depression  . Levaquin [Levofloxacin In D5w] Itching and Swelling  . Lisinopril Cough  . Valsartan Rash       Objective:  BP 138/90   Pulse 84   Wt 211 lb (95.7 kg)   BMI 28.62 kg/m  Gen:  alert, not ill-appearing, no distress, appropriate for age HEENT: head normocephalic without obvious abnormality, conjunctiva and cornea clear, trachea midline Pulm: Normal work of breathing, normal phonation Neuro: alert and oriented x 3, no tremor MSK: extremities atraumatic, normal gait and station Skin: intact, no rashes on exposed skin, no jaundice, no cyanosis Psych: well-groomed, cooperative, good eye contact, euthymic mood, affect mood-congruent, speech is articulate, and thought processes clear and goal-directed  No results found for this or any previous  visit (from the past 72 hour(s)). No results found.    Assessment and Plan: 47 y.o. male with   1. Encounter for medication management - Lipid Panel w/reflex Direct LDL - Uric acid  2. Agoraphobia with panic attacks - GAD7 score 19 - agree to manage Klonopin 1 mg bid - sertraline (ZOLOFT) 25 MG tablet; Take 1 tablet (25 mg total) by mouth daily.  Dispense: 30 tablet; Refill: 5  3. Mixed obsessional thoughts and acts - sertraline (ZOLOFT) 25 MG tablet; Take 1 tablet (25 mg total) by mouth daily.  Dispense: 30 tablet; Refill: 5  4. Mood disorder (HCC) - well controlled - buPROPion (WELLBUTRIN XL) 150 MG 24 hr tablet; Take 1 tablet (150 mg total) by mouth daily.  Dispense: 30 tablet; Refill: 5  5. History of gout - continue Allopurinol  - Uric acid  6. Hyperlipidemia LDL goal <70 - hx of CVA. last LDL 113, 1 month ago. Atorvastatin was increased to 80 mg. Due for recheck fasting lipid panel in 4 weeks - Lipid Panel w/reflex Direct LDL   Patient education and anticipatory guidance given Patient agrees with treatment plan Follow-up in 3 months for medication mgmt or sooner as needed if symptoms worsen or fail to improve  Levonne Hubertharley E. Dallis Czaja PA-C

## 2017-04-15 DIAGNOSIS — E785 Hyperlipidemia, unspecified: Secondary | ICD-10-CM | POA: Diagnosis not present

## 2017-04-15 DIAGNOSIS — Z8739 Personal history of other diseases of the musculoskeletal system and connective tissue: Secondary | ICD-10-CM | POA: Diagnosis not present

## 2017-04-15 DIAGNOSIS — Z79899 Other long term (current) drug therapy: Secondary | ICD-10-CM | POA: Diagnosis not present

## 2017-04-15 LAB — LIPID PANEL W/REFLEX DIRECT LDL
CHOL/HDL RATIO: 4.3 (calc) (ref <5.0)
Cholesterol: 126 mg/dL (ref <200)
HDL: 29 mg/dL — ABNORMAL LOW (ref >40)
LDL CHOLESTEROL (CALC): 78 mg/dL
Non-HDL Cholesterol (Calc): 97 mg/dL (calc) (ref <130)
Triglycerides: 111 mg/dL (ref <150)

## 2017-04-15 LAB — URIC ACID: URIC ACID, SERUM: 3.9 mg/dL — AB (ref 4.0–8.0)

## 2017-04-16 NOTE — Progress Notes (Signed)
Good afternoon Jack [name],  Your cholesterol is much better. We are close to LDL below 70. Keep up the healthy diet and exercise!  Your uric acid level is also at goal for preventing gout flares.  Best, Vinetta Bergamoharley

## 2017-04-26 ENCOUNTER — Other Ambulatory Visit: Payer: Self-pay | Admitting: Physician Assistant

## 2017-04-26 DIAGNOSIS — I10 Essential (primary) hypertension: Secondary | ICD-10-CM

## 2017-05-21 ENCOUNTER — Other Ambulatory Visit: Payer: Self-pay | Admitting: Physician Assistant

## 2017-05-21 DIAGNOSIS — R7303 Prediabetes: Secondary | ICD-10-CM

## 2017-05-23 ENCOUNTER — Other Ambulatory Visit: Payer: Self-pay | Admitting: Physician Assistant

## 2017-05-24 ENCOUNTER — Encounter: Payer: Self-pay | Admitting: Physician Assistant

## 2017-05-24 ENCOUNTER — Other Ambulatory Visit: Payer: Self-pay | Admitting: Physician Assistant

## 2017-06-14 ENCOUNTER — Ambulatory Visit: Payer: Self-pay | Admitting: Physician Assistant

## 2017-06-14 ENCOUNTER — Other Ambulatory Visit: Payer: Self-pay | Admitting: Physician Assistant

## 2017-06-16 ENCOUNTER — Ambulatory Visit: Payer: BLUE CROSS/BLUE SHIELD | Admitting: Physician Assistant

## 2017-06-16 ENCOUNTER — Encounter: Payer: Self-pay | Admitting: Physician Assistant

## 2017-06-16 VITALS — BP 138/93 | HR 76 | Wt 218.0 lb

## 2017-06-16 DIAGNOSIS — R300 Dysuria: Secondary | ICD-10-CM | POA: Diagnosis not present

## 2017-06-16 DIAGNOSIS — H538 Other visual disturbances: Secondary | ICD-10-CM

## 2017-06-16 DIAGNOSIS — J439 Emphysema, unspecified: Secondary | ICD-10-CM | POA: Diagnosis not present

## 2017-06-16 DIAGNOSIS — R399 Unspecified symptoms and signs involving the genitourinary system: Secondary | ICD-10-CM | POA: Diagnosis not present

## 2017-06-16 DIAGNOSIS — I10 Essential (primary) hypertension: Secondary | ICD-10-CM

## 2017-06-16 DIAGNOSIS — Z79899 Other long term (current) drug therapy: Secondary | ICD-10-CM | POA: Diagnosis not present

## 2017-06-16 DIAGNOSIS — R4189 Other symptoms and signs involving cognitive functions and awareness: Secondary | ICD-10-CM | POA: Diagnosis not present

## 2017-06-16 DIAGNOSIS — F4001 Agoraphobia with panic disorder: Secondary | ICD-10-CM | POA: Diagnosis not present

## 2017-06-16 DIAGNOSIS — I6930 Unspecified sequelae of cerebral infarction: Secondary | ICD-10-CM

## 2017-06-16 MED ORDER — CLONAZEPAM 1 MG PO TABS: 1.5000 mg | ORAL_TABLET | Freq: Two times a day (BID) | ORAL | 0 refills | Status: DC

## 2017-06-16 NOTE — Progress Notes (Signed)
HPI:                                                                Jack [name] is a 47 y.o. male who presents to [name]: Primary Care Sports Medicine today for medication management  Patient has a number of concerns today.  Patient is followed by [facility] for his history of CVA approx 1 year ago. He is currently on daily aspirin. Reports ever since his CVA, which was diagnosed as a subacute stroke involving posterior limb of internal capsule and basal ganglia, he has noticed cognitive changes,vision change, [name], his deficits included mild dysarthria, mild right lower facial weakness, mild right hemiparesis. He underwent PT, [name], floaters, visual loss, [name], and decline in cognitive  abilities with math.   Sleep disturbance: His other main concerns are restlessness in the evenings. He has a history of sleep difficulties related to his anxiety and agoraphobia. He has tried Melatonin, Benadryl, Trazodone and Ambien in the past. Reports Xanax/Klonopin have been the only medications that have helped. He usually gets in bed at 10:30-11pm and reports taking more than 1 hour to fall asleep. Also endorses mutliple nighttime awakenings; most of the time he is eventually able to fall back to sleep. Usually wakes around 7 am and does not feel rested. Denies nocturnal leg cramps or urgency to move his legs.  Anxiety/Agoraphobia: patient has been taking benzodiazepeines for over 20 years for severe agoraphobia and panic attacks. He has tried tapering in the past and reports he cannot function. He is currently taking Wellbutrin XL 150 and Clonazepam 1 mg bid. He feels like this regimen is not working well for him. He endorses worsening anxiety in the evenings as well as the sleep difficulties mentioned above. He is requesting to increase his Clonazepam by 1 mg daily for TDD of 3 mg.  Lastly, neurologist noted mild emphysema seen incidentally on CT imaging of the blood vessels in the neck. This was discussed with [name] and he was instructed to follow-up with our office and possibly receive a referral to pulmonology. He denies dyspnea on exertion, shortness of breath, cough, wheeze, orthopnea or PND.    Depression screen [facility] 2/9 04/09/2017 11/03/2016  Decreased Interest 0 0  Down, Depressed, Hopeless 0 0  PHQ - 2 Score 0 0  Altered sleeping 1 -  Tired, decreased energy 0 -  Change in appetite 0 -  Feeling bad or failure about yourself  0 -  Trouble concentrating 0 -  Moving slowly or fidgety/restless 0 -  Suicidal thoughts 0 -  PHQ-9 Score 1 -    GAD 7 : Generalized Anxiety Score 04/09/2017  Nervous, Anxious, on Edge 3  Control/stop worrying 3  Worry too much - different things 3   Trouble relaxing 3  Restless 2  Easily annoyed or irritable 2  Afraid - awful might happen 3  Total GAD 7 Score 19      Past Medical History:  Diagnosis Date  . Anxiety   . Gout   . High cholesterol   . Hypertension   . Stroke [facility])    Past Surgical History:  Procedure Laterality Date  . NO PAST SURGERIES     Social History   Tobacco Use  . Smoking status: Never Smoker  . Smokeless tobacco: Never Used  Substance Use Topics  . Alcohol use: No   family history is not on file. He was adopted.    ROS: negative except as noted in the HPI  Medications: Current Outpatient Medications  Medication Sig Dispense Refill  . allopurinol (ZYLOPRIM) 300 MG tablet TAKE 1 TABLET BY MOUTH EVERY DAY 90 tablet 0  . amLODipine (NORVASC) 10 MG tablet Take 1 tablet (10 mg total) by mouth daily. 30 tablet 5  . aspirin 325 MG tablet Take 1 tablet (325 mg total) by mouth daily.    Marland Kitchen atorvastatin (LIPITOR) 40 MG tablet Take 1 tablet (40 mg total) by mouth 2 (two) times daily. 60 tablet 5  . buPROPion (WELLBUTRIN XL) 150 MG 24 hr tablet Take 1 tablet (150 mg total) by mouth daily. 30 tablet 5  . clonazePAM (KLONOPIN) 1 MG tablet Take 1.5 tablets (1.5 mg total) by mouth 2 (two) times daily. 90 tablet 0  . CO ENZYME Q-10 PO Take by mouth.    . metFORMIN (GLUCOPHAGE-XR) 500 MG 24 hr tablet TAKE 1 TABLET BY MOUTH EVERY DAY WITH BREAKFAST 30 tablet 1  . metoprolol (TOPROL-XL) 200 MG 24 hr tablet TAKE 1 TABLET BY MOUTH EVERY DAY 30 tablet 5   No current facility-administered medications for this visit.    Allergies  Allergen Reactions  . Edarbi [Azilsartan] Other (See Comments)    depression  . Levaquin [Levofloxacin In D5w] Itching and Swelling  . Lisinopril Cough  . Trazodone And Nefazodone Rash  . Valsartan Rash       Objective:  BP (!) 138/93   Pulse 76   Wt 218 lb (98.9 kg)   BMI 29.57 kg/m  Gen:  alert, not ill-appearing, no distress, appropriate for age HEENT: head  normocephalic without obvious abnormality, conjunctiva and cornea clear, wearing glasses, trachea midline Pulm: Normal work of breathing, normal phonation, clear to auscultation bilaterally, no wheezes, rales or rhonchi CV: Normal rate, regular rhythm, s1 and s2 distinct, no murmurs, clicks or rubs  Neuro: alert and oriented  x 3, no tremor MSK: extremities atraumatic, normal gait and station Skin: intact, no rashes on exposed skin, no jaundice, no cyanosis Psych: well-groomed, cooperative, good eye contact, euthymic mood, affect mood-congruent, speech is articulate, and thought processes clear and goal-directed    No results found for this or any previous visit (from the past 72 hour(s)). No results found.    Assessment and Plan: 47 y.o. male with   1. Encounter for medication management   2. History of CVA with residual deficit - cont ASA 325 and keep yearly follow-ups with neuro  3. Blurred vision, right eye - Ambulatory referral to Ophthalmology  4. Agoraphobia with panic attacks, Cognitive Changes, Chronic use of benzo for therapuetic purpose - I am hesitant to raise benzodiazepine dose when patient is complaining of sleep disturbance and cognitive changes. Explained that benzos are only likely to worsen these symptoms, which may be due to his CVA and/or chronic benzo use - plan to check MMSE at all follow-up appointments. If there are interval changes, will refer for neuropsych testing - agree to increase him to 1.5 mg twice daily, but if he requires additional dose adjustments, I will [facility] - Wellbutrin is not the best agent for anxiety/panic to begin with, but patient has self-reported allergy to all SSRI/SNRI (rash). Would also recommend genetic testing in this patient if mood/anxiety is not well-controlled on current regimen - we discussed switching him to Wellbutrin SR formulation, since it is shorter half-life. He  states he has tried this and it does not work well for him.   5. Lower urinary tract symptoms (LUTS) - AUASS 9, moderate, mixed QOL. Recommend PSA and prostate exam and next annual wellness visit - Urinalysis, Routine w reflex microscopic - Urine Culture  6. Hypertension goal BP (blood pressure) < 130/80 BP Readings from Last 3 Encounters:  06/16/17 (!) 138/93  04/09/17 138/90  02/24/17 (!) 141/85  - reports BP is in range at home - cont Amlodipine 10mg  and Metoprolol 200 mg. Cont working on weight loss.   7. Pulmonary emphysema, unspecified emphysema type (HCC) - incidental finding on CT chest. Recommend follow-up office spirometry   Patient education and anticipatory guidance given Patient agrees with treatment plan Follow-up in 2 weeks for nurse BP check, 4 weeks for anxiety or sooner as needed if symptoms worsen or fail to improve  I spent 40 minutes with this patient, greater than 50% was face-to-face time counseling regarding the above diagnoses   Levonne Hubertharley E. Cummings PA-C

## 2017-06-16 NOTE — Patient Instructions (Addendum)
For your blood pressure: - Goal <130/80 - continue your Amlodipine and Metoprolol - monitor and log blood pressures at home - check around the same time each day in a relaxed setting - Limit salt to <2000 mg/day - Follow DASH eating plan - limit alcohol to 2 standard drinks per day for men and 1 per day for women - avoid tobacco products - weight loss: 7% of current body weight - follow-up in 2 weeks for nurse BP check. Bring BP log and BP cuff with you  For anxiety: - I have increased your Clonazepam to 1.5 mg twice a day - Contact your insurance company about genetic testing coverage - Follow-up in 4 weeks

## 2017-06-17 LAB — URINALYSIS, ROUTINE W REFLEX MICROSCOPIC
Bilirubin Urine: NEGATIVE
Glucose, UA: NEGATIVE
HGB URINE DIPSTICK: NEGATIVE
KETONES UR: NEGATIVE
LEUKOCYTES UA: NEGATIVE
NITRITE: NEGATIVE
PROTEIN: NEGATIVE
Specific Gravity, Urine: 1.02 (ref 1.001–1.03)
pH: 5.5 (ref 5.0–8.0)

## 2017-06-17 LAB — URINE CULTURE
MICRO NUMBER:: 90413028
SPECIMEN QUALITY:: ADEQUATE

## 2017-06-22 ENCOUNTER — Encounter: Payer: Self-pay | Admitting: Physician Assistant

## 2017-06-22 DIAGNOSIS — Z79899 Other long term (current) drug therapy: Secondary | ICD-10-CM | POA: Insufficient documentation

## 2017-06-22 DIAGNOSIS — J439 Emphysema, unspecified: Secondary | ICD-10-CM | POA: Insufficient documentation

## 2017-06-22 DIAGNOSIS — R399 Unspecified symptoms and signs involving the genitourinary system: Secondary | ICD-10-CM | POA: Insufficient documentation

## 2017-06-22 DIAGNOSIS — R4189 Other symptoms and signs involving cognitive functions and awareness: Secondary | ICD-10-CM | POA: Insufficient documentation

## 2017-06-22 DIAGNOSIS — H538 Other visual disturbances: Secondary | ICD-10-CM | POA: Insufficient documentation

## 2017-06-22 NOTE — Progress Notes (Signed)
Good afternoon Jack [name],  Your urine tests were negative for infection. I suspect your symptoms are due to an enlarging prostate. If they worsen, I can refer you to a Urologist. Make sure you schedule your annual wellness exam so we can do a prostate exam.   Best, Vinetta Bergamoharley

## 2017-06-25 ENCOUNTER — Telehealth: Payer: Self-pay

## 2017-06-25 NOTE — Telephone Encounter (Signed)
-----   Message from [facility]atiCharley Elizabeth Cummings, [place]-C sent at 06/22/2017 10:58 AM EDT ----- Let Jack NovemberMike know I saw the note from his neurologist about the possible emphysema seen in his lungs on his CT scan. I would like to schedule him for office spirometry to assess his lung function

## 2017-06-25 NOTE — Telephone Encounter (Signed)
Pt notified and scheduled -EH/RMA  

## 2017-06-29 ENCOUNTER — Other Ambulatory Visit: Payer: Self-pay

## 2017-06-30 ENCOUNTER — Ambulatory Visit (INDEPENDENT_AMBULATORY_CARE_PROVIDER_SITE_OTHER): Payer: BLUE CROSS/BLUE SHIELD | Admitting: Physician Assistant

## 2017-06-30 VITALS — BP 126/85 | HR 74 | Ht 72.0 in | Wt 219.0 lb

## 2017-06-30 DIAGNOSIS — J439 Emphysema, unspecified: Secondary | ICD-10-CM | POA: Diagnosis not present

## 2017-06-30 DIAGNOSIS — R942 Abnormal results of pulmonary function studies: Secondary | ICD-10-CM

## 2017-06-30 DIAGNOSIS — Z008 Encounter for other general examination: Secondary | ICD-10-CM

## 2017-06-30 MED ORDER — ALBUTEROL SULFATE (2.5 MG/3ML) 0.083% IN NEBU
2.5000 mg | INHALATION_SOLUTION | Freq: Once | RESPIRATORY_TRACT | Status: AC
  Administered 2017-06-30: 2.5 mg via RESPIRATORY_TRACT

## 2017-06-30 NOTE — Progress Notes (Signed)
HPI:                                                                Jack [name] is a 47 y.o. male who presents to [name]: Primary Care Sports Medicine today for pulmonary function testing  Jack [name] is a pleasant 47 yo M with PMH CVA, HTN, HLD, metabolic syndrome who presents for PFT's after incidental finding of pulmonary emphysema on CT angio of his neck. Also had a CXR 07/03/16 showed left basilar airspace disease and low lung volumes  Reports dyspnea on minimal exertion while walking around a large department store, stopping to rest frequently. States he can climb a flight of stairs without symptoms.   He denies history of tobacco use or occupational exposures to noxious fumes. He does report approx. 9 years of secondhand smoke exposure He also reports history of "walking pneumonia" 7-8 times as a child.   Past Medical History:  Diagnosis Date  . Anxiety   . Gout   . High cholesterol   . Hypertension   . Stroke [facility])    Past Surgical History:  Procedure Laterality Date  . NO PAST SURGERIES     Social History   Tobacco Use  . Smoking status: Never Smoker  . Smokeless tobacco: Never Used  Substance Use Topics  . Alcohol use: No   family history is not on file. He was adopted.    ROS: negative except as noted in the HPI  Medications: Current Outpatient Medications  Medication Sig Dispense Refill  . allopurinol (ZYLOPRIM) 300 MG tablet TAKE 1 TABLET BY MOUTH EVERY DAY 90 tablet 0  . amLODipine (NORVASC) 10 MG tablet Take 1 tablet (10 mg total) by mouth daily. 30 tablet 5  . aspirin 325 MG tablet Take 1 tablet (325 mg total) by mouth daily.    Marland Kitchen atorvastatin (LIPITOR) 40 MG tablet Take 1 tablet (40 mg total) by mouth 2 (two) times daily. 60 tablet 5  . buPROPion (WELLBUTRIN XL) 150 MG 24 hr tablet Take 1 tablet (150 mg total) by mouth daily. 30 tablet 5  . clonazePAM (KLONOPIN) 1 MG tablet Take 1.5 tablets (1.5 mg total) by mouth 2 (two) times  daily. 90 tablet 0  . CO ENZYME Q-10 PO Take by mouth.    . metFORMIN (GLUCOPHAGE-XR) 500 MG 24 hr tablet TAKE 1 TABLET BY MOUTH EVERY DAY WITH BREAKFAST 30 tablet 1  . metoprolol (TOPROL-XL) 200 MG 24 hr tablet TAKE 1 TABLET BY MOUTH EVERY DAY 30 tablet 5   No current facility-administered medications for this visit.    Allergies  Allergen Reactions  . Edarbi [Azilsartan] Other (See Comments)    depression  . Levaquin [Levofloxacin In D5w] Itching and Swelling  . Lisinopril Cough  . Trazodone And Nefazodone Rash  . Valsartan Rash       Objective:  BP 126/85   Pulse 74   Ht 6' (1.829 m)   Wt 219 lb (99.3 kg)   SpO2 99%   BMI 29.70 kg/m  Gen:  alert, not ill-appearing, no distress, appropriate for age HEENT: head normocephalic without obvious abnormality, conjunctiva and cornea clear, wearing glasses, trachea midline Pulm: Normal work of breathing, normal phonation, clear to auscultation bilaterally, no wheezes, rales or  rhonchi CV: Normal rate, regular rhythm, s1 and s2 distinct, no murmurs, clicks or rubs  Neuro: alert and oriented x 3, no tremor MSK: extremities atraumatic, normal gait and station Skin: intact, no rashes on exposed skin, no jaundice, no cyanosis Psych: well-groomed, cooperative, good eye contact, euthymic mood, affect mood-congruent, speech is articulate, and thought processes clear and goal-directed  Office Spirometry Results: Peak Flow: 411 L/min FEV1: 3.02 liters FVC: 3.59 liters FEV1/FVC: 84.1 % FVC  % Predicted: 66 % FEV % Predicted: 71 % FeF 25-75: 3.45 liters FeF 25-75 % Predicted: 92   No results found for this or any previous visit (from the past 72 hour(s)). No results found.    Assessment and Plan: [age] y.o. male with   Spirometry showing significantly restrictive pattern with low FVC, FEV1 and PEF that was not reversible with bronchodilator. Nurse reported good respiratory effort.  No clear etiology for restrictive pattern (no  obesity, neuromuscular disease, no evidence of pulmonary fibrosis on prior imaging). Referring to pulmonology for further work-up.  Encounter for pulmonary function testing  Restrictive pattern present on pulmonary function testing - Plan: Ambulatory referral to Pulmonology  Pulmonary emphysema, unspecified emphysema type (HCC) - Plan: PR EVAL OF BRONCHOSPASM, albuterol (PROVENTIL) (2.5 MG/3ML) 0.083% nebulizer solution 2.5 mg, Ambulatory referral to Pulmonology    Patient education and anticipatory guidance given Patient agrees with treatment plan Follow-up as needed if symptoms worsen or fail to improve  Jack Hubertharley E. Cummings PA-C

## 2017-07-01 ENCOUNTER — Encounter: Payer: Self-pay | Admitting: Physician Assistant

## 2017-07-08 ENCOUNTER — Ambulatory Visit: Payer: Self-pay | Admitting: Physician Assistant

## 2017-07-08 ENCOUNTER — Other Ambulatory Visit: Payer: Self-pay | Admitting: Physician Assistant

## 2017-07-08 DIAGNOSIS — R7303 Prediabetes: Secondary | ICD-10-CM

## 2017-07-13 DIAGNOSIS — I639 Cerebral infarction, unspecified: Secondary | ICD-10-CM | POA: Diagnosis not present

## 2017-07-13 DIAGNOSIS — H2513 Age-related nuclear cataract, bilateral: Secondary | ICD-10-CM | POA: Diagnosis not present

## 2017-07-13 DIAGNOSIS — E119 Type 2 diabetes mellitus without complications: Secondary | ICD-10-CM | POA: Diagnosis not present

## 2017-07-13 DIAGNOSIS — H527 Unspecified disorder of refraction: Secondary | ICD-10-CM | POA: Diagnosis not present

## 2017-07-13 LAB — HM DIABETES EYE EXAM

## 2017-07-16 ENCOUNTER — Encounter: Payer: Self-pay | Admitting: Physician Assistant

## 2017-07-16 ENCOUNTER — Ambulatory Visit: Payer: BLUE CROSS/BLUE SHIELD | Admitting: Physician Assistant

## 2017-07-16 VITALS — BP 136/88 | HR 76 | Wt 218.0 lb

## 2017-07-16 DIAGNOSIS — Z79899 Other long term (current) drug therapy: Secondary | ICD-10-CM

## 2017-07-16 DIAGNOSIS — F4001 Agoraphobia with panic disorder: Secondary | ICD-10-CM | POA: Diagnosis not present

## 2017-07-16 MED ORDER — CLONAZEPAM 1 MG PO TABS: 1.0000 mg | ORAL_TABLET | Freq: Two times a day (BID) | ORAL | 2 refills | Status: DC

## 2017-07-16 NOTE — Progress Notes (Signed)
HPI:                                                                Jack [name] is a 47 y.o. male who presents to [name]: Primary Care Sports Medicine today for anxiety follow-up  Anxiety  Presents for initial visit. Onset was more than 5 years ago. The problem has been rapidly improving. Symptoms include excessive worry, insomnia, nervous/anxious behavior and panic. Patient reports no compulsions, depressed mood, obsessions or suicidal ideas. Symptoms occur most days. The severity of symptoms is interfering with daily activities. The symptoms are aggravated by specific phobias, work stress and social activities. The quality of sleep is good. Nighttime awakenings: none.   His past medical history is significant for anxiety/panic attacks. Past treatments include benzodiazephines, non-SSRI antidepressants and counseling (CBT). Compliance with prior treatments has been good.    Depression screen [facility] 2/9 07/16/2017 04/09/2017 11/03/2016  Decreased Interest 0 0 0  Down, Depressed, Hopeless 0 0 0  PHQ - 2 Score 0 0 0  Altered sleeping 0 1 -  Tired, decreased energy 0 0 -  Change in appetite 0 0 -  Feeling bad or failure about yourself  0 0 -  Trouble concentrating 0 0 -  Moving slowly or fidgety/restless 0 0 -  Suicidal thoughts 0 0 -  PHQ-9 Score 0 1 -    GAD 7 : Generalized Anxiety Score 07/16/2017 04/09/2017  Nervous, Anxious, on Edge 0 3  Control/stop worrying 0 3  Worry too much - different things 1 3  Trouble relaxing 0 3  Restless 0 2  Easily annoyed or irritable 1 2  Afraid - awful might happen 0 3  Total GAD 7 Score 2 19      Past Medical History:  Diagnosis Date  . Anxiety   . Gout   . High cholesterol   . Hypertension   . Stroke [facility])    Past Surgical History:  Procedure Laterality Date  . NO PAST SURGERIES     Social History   Tobacco Use  . Smoking status: Never Smoker  . Smokeless tobacco: Never Used  Substance Use Topics  .  Alcohol use: No   family history is not on file. He was adopted.    ROS: negative except as noted in the HPI  Medications: Current Outpatient Medications  Medication Sig Dispense Refill  . allopurinol (ZYLOPRIM) 300 MG tablet TAKE 1 TABLET BY MOUTH EVERY DAY 90 tablet 0  . amLODipine (NORVASC) 10 MG tablet Take 1 tablet (10 mg total) by mouth daily. 30 tablet 5  . aspirin 325 MG tablet Take 1 tablet (325 mg total) by mouth daily.    Marland Kitchen atorvastatin (LIPITOR) 40 MG tablet Take 1 tablet (40 mg total) by mouth 2 (two) times daily. 60 tablet 5  . buPROPion (WELLBUTRIN XL) 150 MG 24 hr tablet Take 1 tablet (150 mg total) by mouth daily. 30 tablet 5  . clonazePAM (KLONOPIN) 1 MG tablet Take 1-1.5 tablets (1-1.5 mg total) by mouth 2 (two) times daily. 80 tablet 2  . CO ENZYME Q-10 PO Take by mouth.    . metFORMIN (GLUCOPHAGE-XR) 500 MG 24 hr tablet TAKE 1 TABLET BY MOUTH EVERY DAY WITH BREAKFAST 90 tablet 0  .  metoprolol (TOPROL-XL) 200 MG 24 hr tablet TAKE 1 TABLET BY MOUTH EVERY DAY 30 tablet 5   No current facility-administered medications for this visit.    Allergies  Allergen Reactions  . Edarbi [Azilsartan] Other (See Comments)    depression  . Levaquin [Levofloxacin In D5w] Itching and Swelling  . Lisinopril Cough  . Trazodone And Nefazodone Rash  . Valsartan Rash       Objective:  BP 136/88   Pulse 76   Wt 218 lb (98.9 kg)   BMI 29.57 kg/m  Gen:  alert, not ill-appearing, no distress, appropriate for age HEENT: head normocephalic without obvious abnormality, conjunctiva and cornea clear, wearing glasses, trachea midline Pulm: Normal work of breathing, normal phonation Neuro: alert and oriented x 3, no tremor MSK: extremities atraumatic, normal gait and station Skin: intact, no rashes on exposed skin, no jaundice, no cyanosis Psych: well-groomed, cooperative, good eye contact, euthymic mood, affect mood-congruent, appears anxious, speech is articulate, and thought  processes clear and goal-directed, no SI/HI, no AH/VH    No results found for this or any previous visit (from the past 72 hour(s)). No results found.    Assessment and Plan: 47 y.o. male with   Chronic use of benzodiazepine for therapeutic purpose  Agoraphobia with panic attacks - Plan: clonazePAM (KLONOPIN) 1 MG tablet  PHQ9=0, GAD7=2 Patient responded well to temporary increase in Clonazepam dose. He was under increased stress due to getting married. He feels significantly improved. We will return to his Clonazepam 1 mg bid dose and reserve higher doses for prn use for panic attacks. Recommended alternating dosing schedule to prevent tolerance, especially since he is using benzo for sleep. Encouraged sleep hygiene.  I expressed my concern about tolerance due to his chronic benzodiazepine use. My concern is more that if he becomes tolerant on 3 mg we will have no room to manage acute anxiety/panic attacks, especially in the setting of his SSRI intolerance. His social anxiety and agoraphobia is severe and warrants benzodiazepine use, but I cautioned him that we are approaching the max daily dose of 4 mg. He is already experiencing cognitive difficulties, which concerns me. He will follow-up with [facility] if symptoms are not adequately controlled on current medication regimen. His previous mental health provider advised against exceeding 2 mg Clonazepam daily and we discussed the increased risks of tolerance, dependence and adverse effects.    Patient education and anticipatory guidance given Patient agrees with treatment plan Follow-up in 3 months or sooner as needed if symptoms worsen or fail to improve  Levonne Hubert PA-C

## 2017-07-16 NOTE — Patient Instructions (Addendum)
To prevent tolerance at higher doses of benzodiazepine and to allow room in total daily dose for treating panic attacks try alternating your dosing schedule. For example: 1. If you get a good night's sleep, try only taking 1 mg the following evening. Practice your sleep hygiene and relaxation exercises. If you have poor sleep 2 night's in a row, take 1.5 mg the following evening. And so on and so forth 2. Take your 1 mg dose in the morning. On days when you are called out for an inspection or other triggers arise, take an extra 0.5 mg as needed.   Other resources: - [place]Bus.at - mindbodygreen.com - 7cupsoftea - https://malone.com/  Sleep Hygiene . Limiting daytime naps to 30 minutes . Napping does not make up for inadequate nighttime sleep. However, a short nap of 20-30 minutes can help to improve mood, alertness and performance.  . Avoiding stimulants such as  caffeine and nicotine close to bedtime.  And when it comes to alcohol, moderation is key 4. While alcohol is well-known to help you fall asleep faster, too much close to bedtime can disrupt sleep in the second half of the night as the body begins to process the alcohol.    . Exercising to promote good quality sleep.  As little as 10 minutes of aerobic exercise, such as walking or cycling, can drastically improve nighttime sleep quality.  For the best night's sleep, most people should avoid strenuous workouts close to bedtime. However, the effect of intense nighttime exercise on sleep differs from person to person, so find out what works best for you.   . Steering clear of food that can be disruptive right before sleep.   Heavy or rich foods, fatty or fried meals, spicy dishes, citrus fruits, and carbonated drinks can trigger indigestion for some people. When this occurs close to bedtime, it can lead to painful heartburn that disrupts sleep. . Ensuring adequate  exposure to natural light.  This is particularly important for individuals who may not venture outside frequently. Exposure to sunlight during the day, as well as darkness at night, helps to maintain a healthy sleep-wake cycle . Marland Kitchen Establishing a regular relaxing bedtime routine.  A regular nightly routine helps the body recognize that it is bedtime. This could include taking warm shower or bath, reading a book, or light stretches. When possible, try to avoid emotionally upsetting conversations and activities before attempting to sleep. . Making sure that the sleep environment is pleasant.  Mattress and pillows should be comfortable. The bedroom should be cool - between 60 and 67 degrees - for optimal sleep. Bright light from lamps, cell phone and TV screens can make it difficult to fall asleep4, so turn those light off or adjust them when possible. Consider using blackout curtains, eye shades, ear plugs, "white noise" machines, humidifiers, fans and other devices that can make the bedroom more relaxing. . Meditation. YouTube Kristopher Glee. There are many smartphone apps as well

## 2017-07-19 ENCOUNTER — Encounter: Payer: Self-pay | Admitting: Physician Assistant

## 2017-07-20 ENCOUNTER — Encounter: Payer: Self-pay | Admitting: Physician Assistant

## 2017-07-20 DIAGNOSIS — F410 Panic disorder [episodic paroxysmal anxiety] without agoraphobia: Secondary | ICD-10-CM | POA: Diagnosis not present

## 2017-07-21 ENCOUNTER — Other Ambulatory Visit: Payer: Self-pay | Admitting: Physician Assistant

## 2017-07-21 DIAGNOSIS — I10 Essential (primary) hypertension: Secondary | ICD-10-CM

## 2017-07-26 ENCOUNTER — Encounter: Payer: Self-pay | Admitting: Pulmonary Disease

## 2017-07-26 ENCOUNTER — Ambulatory Visit (INDEPENDENT_AMBULATORY_CARE_PROVIDER_SITE_OTHER)
Admission: RE | Admit: 2017-07-26 | Discharge: 2017-07-26 | Disposition: A | Discharge status: Home / Self Care | Payer: BLUE CROSS/BLUE SHIELD | Source: Ambulatory Visit | Attending: Pulmonary Disease | Admitting: Pulmonary Disease

## 2017-07-26 ENCOUNTER — Ambulatory Visit: Payer: BLUE CROSS/BLUE SHIELD | Admitting: Pulmonary Disease

## 2017-07-26 VITALS — BP 128/76 | HR 71 | Ht 72.0 in | Wt 217.0 lb

## 2017-07-26 DIAGNOSIS — J439 Emphysema, unspecified: Secondary | ICD-10-CM

## 2017-07-26 DIAGNOSIS — R0602 Shortness of breath: Secondary | ICD-10-CM | POA: Diagnosis not present

## 2017-07-26 NOTE — Progress Notes (Signed)
Jack [name]    [id number]    06-Oct-1970  Primary Care Physician:Cummings, Bernerd Pho, PA-C  Referring Physician: Riki Rusk [address] [place], [place] [address]  Chief complaint: Consult for emphysema, abnormal spirometry  HPI: 47 year old with agoraphobia, anxiety, CVA.  He had a CT head and neck for work-up of his CVA in 2018 which reported emphysema in the apex of the lung.  He is followed up with primary care with recent spirometry which did not show any obstruction and possibly restriction Complains of dyspnea with activity.  Denies symptoms at rest.  No cough, sputum production, wheezing, chills.  Pets: 2 dogs.  No birds, cats, farm animals Occupation: Works as a Investment banker, corporate Exposures: No known exposures, no dampness, mold, Jacuzzi, hot Smoking history: Never smoked.  Exposed to secondhand smoke Travel history: No significant travel Relevant family history: No family history of lung issues  Outpatient Encounter Medications as of 07/26/2017  Medication Sig  . allopurinol (ZYLOPRIM) 300 MG tablet TAKE 1 TABLET BY MOUTH EVERY DAY  . amLODipine (NORVASC) 10 MG tablet TAKE 1 TABLET BY MOUTH EVERY DAY  . aspirin 325 MG tablet Take 1 tablet (325 mg total) by mouth daily.  Marland Kitchen atorvastatin (LIPITOR) 40 MG tablet Take 1 tablet (40 mg total) by mouth 2 (two) times daily.  Marland Kitchen buPROPion (WELLBUTRIN XL) 150 MG 24 hr tablet Take 1 tablet (150 mg total) by mouth daily.  . clonazePAM (KLONOPIN) 1 MG tablet Take 1-1.5 tablets (1-1.5 mg total) by mouth 2 (two) times daily.  . CO ENZYME Q-10 PO Take by mouth.  . metFORMIN (GLUCOPHAGE-XR) 500 MG 24 hr tablet TAKE 1 TABLET BY MOUTH EVERY DAY WITH BREAKFAST  . metoprolol (TOPROL-XL) 200 MG 24 hr tablet TAKE 1 TABLET BY MOUTH EVERY DAY   No facility-administered encounter medications on file as of 07/26/2017.     Allergies as of 07/26/2017 - Review Complete 07/26/2017  Allergen  Reaction Noted  . Edarbi [azilsartan] Other (See Comments) 11/03/2016  . Levaquin [levofloxacin in d5w] Itching and Swelling 04/26/2015  . Lisinopril Cough 11/03/2016  . Trazodone and nefazodone Rash 06/16/2017  . Valsartan Rash 11/03/2016    Past Medical History:  Diagnosis Date  . Anxiety   . Gout   . High cholesterol   . Hypertension   . Stroke [facility])     Past Surgical History:  Procedure Laterality Date  . NO PAST SURGERIES      Family History  Adopted: Yes  Problem Relation Age of Onset  . Stroke Neg Hx     Social History   Socioeconomic History  . Marital status: Single    Spouse name: Not on file  . Number of children: 0  . Years of education: Some college  . Highest education level: Not on file  Occupational History  . 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 Financial resource strain: Not on file  . Food insecurity:    Worry: Not on file    Inability: Not on file  . Transportation needs:    Medical: Not on file    Non-medical: Not on file  Tobacco Use  . Smoking status: Never Smoker  . Smokeless tobacco: Never Used  Substance and Sexual Activity  . Alcohol use: No  . Drug use: No  . Sexual activity: Yes  Lifestyle  . Physical activity:    Days per week: Not on file  Minutes per session: Not on file  . Stress: Not on file  Relationships  . Social connections:    Talks on phone: Not on file    Gets together: Not on file    Attends religious service: Not on file    Active member of club or organization: Not on file    Attends meetings of clubs or organizations: Not on file    Relationship status: Not on file  . Intimate partner violence:    Fear of current or ex partner: Not on file    Emotionally abused: Not on file    Physically abused: Not on file    Forced sexual activity: Not on file  Other Topics Concern  . Not on file  Social History Narrative   Lives w/ his partner   Right-handed   Caffeine: none    Review of  systems: Review of Systems  Constitutional: Negative for fever and chills.  HENT: Negative.   Eyes: Negative for blurred vision.  Respiratory: as per HPI  Cardiovascular: Negative for chest pain and palpitations.  Gastrointestinal: Negative for vomiting, diarrhea, blood per rectum. Genitourinary: Negative for dysuria, urgency, frequency and hematuria.  Musculoskeletal: Negative for myalgias, back pain and joint pain.  Skin: Negative for itching and rash.  Neurological: Negative for dizziness, tremors, focal weakness, seizures and loss of consciousness.  Endo/Heme/Allergies: Negative for environmental allergies.  Psychiatric/Behavioral: Negative for depression, suicidal ideas and hallucinations.  All other systems reviewed and are negative.  Physical Exam: Blood pressure 128/76, pulse 71, height 6' (1.829 m), weight 217 lb (98.4 kg), SpO2 97 %. Gen:      No acute distress HEENT:  EOMI, sclera anicteric Neck:     No masses; no thyromegaly Lungs:    Clear to auscultation bilaterally; normal respiratory effort CV:         Regular rate and rhythm; no murmurs Abd:      + bowel sounds; soft, non-tender; no palpable masses, no distension Ext:    No edema; adequate peripheral perfusion Skin:      Warm and dry; no rash Neuro: alert and oriented x 3 Psych: normal mood and affect  Data Reviewed: CT head and neck 07/02/2016- apical lung images as emphysema. Chest x-ray 07/03/2016-minimal left basal atelectasis, low lung volumes  Spirometry 06/30/2017 FVC 3.59 [66%], FEV1 3.02 [71%], F/F 84%, FEF 25-75 3.45 [92%] No obstruction, restriction possible based on reduction in FVC, FEV1  Assessment:  Consult for emphysema, abnormal spirometry CT scan in 2018 is read as emphysema at the lung apex however on my review this does not appear to be significant.  He is a never smoker although he is been exposed to secondhand smoke Spirometry shows no obstruction, possible restriction.  There is no evidence  of interstitial lung disease on past CXR We will get full PFTs with lung volumes for better evaluation.  Repeat x-ray today Consider high-res CT if either the x-ray is abnormal or PFTs show restriction, DLCO impairement  Plan/Recommendations: - Chest x-ray, full PFTs  Chilton Greathouse MD [facility] 07/26/2017, 11:17 AM  CC: Donzetta Kohut*

## 2017-07-26 NOTE — Patient Instructions (Signed)
We will schedule you for a chest x-ray and pulmonary function test for better evaluation of your lung Follow-up after these tests for review of results.

## 2017-07-28 ENCOUNTER — Other Ambulatory Visit: Payer: Self-pay

## 2017-08-02 ENCOUNTER — Telehealth: Payer: Self-pay | Admitting: Pulmonary Disease

## 2017-08-02 NOTE — Telephone Encounter (Signed)
   Notes recorded by Chilton Greathouse, MD on 07/29/2017 at 3:54 PM EDT Please let patient know chest x-ray shows minimal changes of atelectasis in the left lung base that appears chronic. There is no acute change  Advised pt of results. Pt understood and nothing further is needed.

## 2017-08-03 ENCOUNTER — Other Ambulatory Visit: Payer: Self-pay | Admitting: Physician Assistant

## 2017-08-03 DIAGNOSIS — E782 Mixed hyperlipidemia: Secondary | ICD-10-CM

## 2017-08-03 DIAGNOSIS — I2510 Atherosclerotic heart disease of native coronary artery without angina pectoris: Secondary | ICD-10-CM

## 2017-08-18 ENCOUNTER — Other Ambulatory Visit: Payer: Self-pay | Admitting: Physician Assistant

## 2017-08-25 ENCOUNTER — Ambulatory Visit: Payer: Self-pay | Admitting: Physician Assistant

## 2017-08-31 ENCOUNTER — Encounter: Payer: Self-pay | Admitting: Physician Assistant

## 2017-08-31 DIAGNOSIS — I639 Cerebral infarction, unspecified: Secondary | ICD-10-CM | POA: Diagnosis not present

## 2017-08-31 LAB — HM DIABETES EYE EXAM

## 2017-09-07 ENCOUNTER — Ambulatory Visit: Payer: Self-pay | Admitting: Pulmonary Disease

## 2017-09-11 ENCOUNTER — Other Ambulatory Visit: Payer: Self-pay | Admitting: Physician Assistant

## 2017-09-11 DIAGNOSIS — F39 Unspecified mood [affective] disorder: Secondary | ICD-10-CM

## 2017-09-24 ENCOUNTER — Other Ambulatory Visit: Payer: Self-pay | Admitting: Physician Assistant

## 2017-09-24 DIAGNOSIS — I10 Essential (primary) hypertension: Secondary | ICD-10-CM

## 2017-10-03 ENCOUNTER — Other Ambulatory Visit: Payer: Self-pay | Admitting: Physician Assistant

## 2017-10-03 DIAGNOSIS — R7303 Prediabetes: Secondary | ICD-10-CM

## 2017-10-05 ENCOUNTER — Encounter: Payer: Self-pay | Admitting: Physician Assistant

## 2017-10-06 ENCOUNTER — Encounter: Payer: Self-pay | Admitting: Physician Assistant

## 2017-10-18 ENCOUNTER — Ambulatory Visit: Payer: Self-pay | Admitting: Physician Assistant

## 2017-10-22 ENCOUNTER — Ambulatory Visit: Payer: Self-pay | Admitting: Physician Assistant

## 2017-10-26 ENCOUNTER — Encounter: Payer: Self-pay | Admitting: Physician Assistant

## 2017-10-26 ENCOUNTER — Ambulatory Visit: Payer: BLUE CROSS/BLUE SHIELD | Admitting: Physician Assistant

## 2017-10-26 VITALS — BP 146/98 | HR 72 | Wt 221.0 lb

## 2017-10-26 DIAGNOSIS — F4001 Agoraphobia with panic disorder: Secondary | ICD-10-CM | POA: Diagnosis not present

## 2017-10-26 DIAGNOSIS — I10 Essential (primary) hypertension: Secondary | ICD-10-CM

## 2017-10-26 DIAGNOSIS — Z79899 Other long term (current) drug therapy: Secondary | ICD-10-CM

## 2017-10-26 DIAGNOSIS — F39 Unspecified mood [affective] disorder: Secondary | ICD-10-CM

## 2017-10-26 DIAGNOSIS — E785 Hyperlipidemia, unspecified: Secondary | ICD-10-CM | POA: Diagnosis not present

## 2017-10-26 DIAGNOSIS — I2510 Atherosclerotic heart disease of native coronary artery without angina pectoris: Secondary | ICD-10-CM

## 2017-10-26 MED ORDER — AMLODIPINE BESYLATE 10 MG PO TABS: 10.0000 mg | ORAL_TABLET | Freq: Every day | ORAL | 1 refills | Status: DC

## 2017-10-26 MED ORDER — ATORVASTATIN CALCIUM 40 MG PO TABS: 40.0000 mg | ORAL_TABLET | Freq: Two times a day (BID) | ORAL | 1 refills | Status: DC

## 2017-10-26 MED ORDER — METOPROLOL SUCCINATE ER 200 MG PO TB24: 200.0000 mg | ORAL_TABLET | Freq: Every day | ORAL | 1 refills | Status: DC

## 2017-10-26 MED ORDER — CLONAZEPAM 1 MG PO TABS: 1.0000 mg | ORAL_TABLET | Freq: Two times a day (BID) | ORAL | 2 refills | Status: DC

## 2017-10-26 MED ORDER — BUPROPION HCL ER (XL) 150 MG PO TB24: 150.0000 mg | ORAL_TABLET | Freq: Every day | ORAL | 1 refills | Status: DC

## 2017-10-26 NOTE — Progress Notes (Signed)
HPI:                                                                Jack [name] is a 47 y.o. male who [facility] today for medication management  HTN: taking Amlodipine 10 mg and Metoprolol XL 200 mg daily. Compliant with medications. Checks BP's at home. BP range 130's/80's. Denies vision change, headache, chest pain with exertion, orthopnea, lightheadedness, syncope and edema. Risk factors include: male sex, hx of CVA  GAD/Agoraphobia: doing well on reduced dose of Clonazepam 1 mg bid. No recent panic attacks. Functioning well at current job.  Prediabetes: taking Metformin XR daily. He was having some GI upset, but found that taking probiotics and dosing the medication at night was helpful.  Hx of CVA: taking Atorvastatin 40 mg bid and aspirin.    [facility] 07/16/2017 04/09/2017 11/03/2016  Decreased Interest 0 0 0  Down, Depressed, Hopeless 0 0 0  PHQ - 2 Score 0 0 0  Altered sleeping 0 1 -  Tired, decreased energy 0 0 -  Change in appetite 0 0 -  Feeling bad or failure about yourself  0 0 -  Trouble concentrating 0 0 -  Moving slowly or fidgety/restless 0 0 -  Suicidal thoughts 0 0 -  PHQ-9 Score 0 1 -    GAD 7 : Generalized Anxiety Score 07/16/2017 04/09/2017  Nervous, Anxious, on Edge 0 3  Control/stop worrying 0 3  Worry too much - different things 1 3  Trouble relaxing 0 3  Restless 0 2  Easily annoyed or irritable 1 2  Afraid - awful might happen 0 3  Total GAD 7 Score 2 19      Past Medical History:  Diagnosis Date  . Anxiety   . Gout   . High cholesterol   . Hypertension   . Stroke [facility](HCC)    Past Surgical History:  Procedure Laterality Date  . NO PAST SURGERIES     Social History   Tobacco Use  . Smoking status: Never Smoker  . Smokeless tobacco: Never Used  Substance Use Topics  . Alcohol use: No   family history is not on file. He was adopted.    ROS: negative  except as noted in the HPI  Medications: Current Outpatient Medications  Medication Sig Dispense Refill  . allopurinol (ZYLOPRIM) 300 MG tablet TAKE 1 TABLET BY MOUTH EVERY DAY 90 tablet 0  . amLODipine (NORVASC) 10 MG tablet Take 1 tablet (10 mg total) by mouth daily. 90 tablet 1  . aspirin 325 MG tablet Take 1 tablet (325 mg total) by mouth daily.    Marland Kitchen. atorvastatin (LIPITOR) 40 MG tablet Take 1 tablet (40 mg total) by mouth 2 (two) times daily. 180 tablet 1  . buPROPion (WELLBUTRIN XL) 150 MG 24 hr tablet Take 1 tablet (150 mg total) by mouth daily. 90 tablet 1  . clonazePAM (KLONOPIN) 1 MG tablet Take 1-1.5 tablets (1-1.5 mg total) by mouth 2 (two) times daily. 70 tablet 2  . CO ENZYME Q-10 PO Take by mouth.    . metFORMIN (GLUCOPHAGE-XR) 500 MG 24 hr tablet TAKE 1 TABLET BY MOUTH EVERY DAY WITH BREAKFAST 90 tablet 0  .  metoprolol (TOPROL-XL) 200 MG 24 hr tablet Take 1 tablet (200 mg total) by mouth daily. 90 tablet 1  . Probiotic Product (PHILLIPS COLON HEALTH PO) Take by mouth.     No current facility-administered medications for this visit.    Allergies  Allergen Reactions  . Edarbi [Azilsartan] Other (See Comments)    depression  . Levaquin [Levofloxacin In D5w] Itching and Swelling  . Lisinopril Cough  . Trazodone And Nefazodone Rash  . Valsartan Rash       Objective:  BP (!) 146/98   Pulse 72   Wt 221 lb (100.2 kg)   BMI 29.97 kg/m  Gen:  alert, not ill-appearing, no distress, appropriate for age HEENT: head normocephalic without obvious abnormality, conjunctiva and cornea clear, trachea midline Pulm: Normal work of breathing, normal phonation, clear to auscultation bilaterally, no wheezes, rales or rhonchi CV: Normal rate, regular rhythm, s1 and s2 distinct, no murmurs, clicks or rubs  Neuro: alert and oriented x 3, no tremor MSK: extremities atraumatic, normal gait and station, 1+ right-sided peripheral edema Skin: intact, no rashes on exposed skin, no jaundice,  no cyanosis Psych: well-groomed, cooperative, good eye contact, euthymic mood, affect mood-congruent, speech is articulate, and thought processes clear and goal-directed    No results found for this or any previous visit (from the past 72 hour(s)). No results found.    Assessment and Plan: 47 y.o. male with   .Jack NeedleMichael was seen today for follow-up.  Diagnoses and all orders for this visit:  Hyperlipidemia LDL goal <70 -     atorvastatin (LIPITOR) 40 MG tablet; Take 1 tablet (40 mg total) by mouth 2 (two) times daily.  Agoraphobia with panic attacks -     clonazePAM (KLONOPIN) 1 MG tablet; Take 1-1.5 tablets (1-1.5 mg total) by mouth 2 (two) times daily.  Mood disorder (HCC) -     buPROPion (WELLBUTRIN XL) 150 MG 24 hr tablet; Take 1 tablet (150 mg total) by mouth daily.  Hypertension goal BP (blood pressure) < 130/80 -     amLODipine (NORVASC) 10 MG tablet; Take 1 tablet (10 mg total) by mouth daily. -     metoprolol (TOPROL-XL) 200 MG 24 hr tablet; Take 1 tablet (200 mg total) by mouth daily.  ASCVD (arteriosclerotic cardiovascular disease) -     atorvastatin (LIPITOR) 40 MG tablet; Take 1 tablet (40 mg total) by mouth 2 (two) times daily.  Encounter for medication management  Chronic use of benzodiazepine for therapeutic purpose   HLD goal <70 - hx of CVA, last LDL 78, 6 months ago - discussed that he is a good candidate for Repatha to prevent future CV events. Patient declines for now. Information provided - cont high intensity statin therapy and asa - BP mildly out of range in office today, home readings stage 1 hypertensive range on max dose Metoprolol and Amlodipine. Intolerance to ACE/ARB. Counseled on therapeutic lifestyle changes    Patient education and anticipatory guidance given Patient agrees with treatment plan Follow-up in 5 months for medication management as needed if symptoms worsen or fail to improve  Levonne Hubertharley E. Cummings PA-C

## 2017-10-26 NOTE — Patient Instructions (Addendum)
For your blood pressure: - Goal <130/80 - continue your Amlodipine and Metoprolol - monitor and log blood pressures at home - check around the same time each day in a relaxed setting - Limit salt to <2000 mg/day - Follow DASH eating plan - limit alcohol to 2 standard drinks per day for men and 1 per day for women - avoid tobacco products - weight loss: 7% of current body weight - follow-up every 6 months for your blood pressure     Stroke Prevention Some medical conditions and behaviors are associated with a higher chance of having a stroke. You can help prevent a stroke by making nutrition, lifestyle, and other changes, including managing any medical conditions you may have. What nutrition changes can be made?  Eat healthy foods. You can do this by: ? Choosing foods high in fiber, such as fresh fruits and vegetables and whole grains. ? Eating at least 5 or more servings of fruits and vegetables a day. Try to fill half of your plate at each meal with fruits and vegetables. ? Choosing lean protein foods, such as lean cuts of meat, poultry without skin, fish, tofu, beans, and nuts. ? Eating low-fat dairy products. ? Avoiding foods that are high in salt (sodium). This can help lower blood pressure. ? Avoiding foods that have saturated fat, trans fat, and cholesterol. This can help prevent high cholesterol. ? Avoiding processed and premade foods.  Follow your health care provider's specific guidelines for losing weight, controlling high blood pressure (hypertension), lowering high cholesterol, and managing diabetes. These may include: ? Reducing your daily calorie intake. ? Limiting your daily sodium intake to 1,500 milligrams (mg). ? Using only healthy fats for cooking, such as olive oil, canola oil, or sunflower oil. ? Counting your daily carbohydrate intake. What lifestyle changes can be made?  Maintain a healthy weight. Talk to your health care provider about your ideal  weight.  Get at least 30 minutes of moderate physical activity at least 5 days a week. Moderate activity includes brisk walking, biking, and swimming.  Do not use any products that contain nicotine or tobacco, such as cigarettes and e-cigarettes. If you need help quitting, ask your health care provider. It may also be helpful to avoid exposure to secondhand smoke.  Limit alcohol intake to no more than 1 drink a day for nonpregnant women and 2 drinks a day for men. One drink equals 12 oz of beer, 5 oz of wine, or 1 oz of hard liquor.  Stop any illegal drug use.  Avoid taking birth control pills. Talk to your health care provider about the risks of taking birth control pills if: ? You are over 48 years old. ? You smoke. ? You get migraines. ? You have ever had a blood clot. What other changes can be made?  Manage your cholesterol levels. ? Eating a healthy diet is important for preventing high cholesterol. If cholesterol cannot be managed through diet alone, you may also need to take medicines. ? Take any prescribed medicines to control your cholesterol as told by your health care provider.  Manage your diabetes. ? Eating a healthy diet and exercising regularly are important parts of managing your blood sugar. If your blood sugar cannot be managed through diet and exercise, you may need to take medicines. ? Take any prescribed medicines to control your diabetes as told by your health care provider.  Control your hypertension. ? To reduce your risk of stroke, try to keep your blood  pressure below 130/80. ? Eating a healthy diet and exercising regularly are an important part of controlling your blood pressure. If your blood pressure cannot be managed through diet and exercise, you may need to take medicines. ? Take any prescribed medicines to control hypertension as told by your health care provider. ? Ask your health care provider if you should monitor your blood pressure at home. ? Have  your blood pressure checked every year, even if your blood pressure is normal. Blood pressure increases with age and some medical conditions.  Get evaluated for sleep disorders (sleep apnea). Talk to your health care provider about getting a sleep evaluation if you snore a lot or have excessive sleepiness.  Take over-the-counter and prescription medicines only as told by your health care provider. Aspirin or blood thinners (antiplatelets or anticoagulants) may be recommended to reduce your risk of forming blood clots that can lead to stroke.  Make sure that any other medical conditions you have, such as atrial fibrillation or atherosclerosis, are managed. What are the warning signs of a stroke? The warning signs of a stroke can be easily remembered as BEFAST.  B is for balance. Signs include: ? Dizziness. ? Loss of balance or coordination. ? Sudden trouble walking.  E is for eyes. Signs include: ? A sudden change in vision. ? Trouble seeing.  F is for face. Signs include: ? Sudden weakness or numbness of the face. ? The face or eyelid drooping to one side.  A is for arms. Signs include: ? Sudden weakness or numbness of the arm, usually on one side of the body.  S is for speech. Signs include: ? Trouble speaking (aphasia). ? Trouble understanding.  T is for time. ? These symptoms may represent a serious problem that is an emergency. Do not wait to see if the symptoms will go away. Get medical help right away. Call your local emergency services (911 in the [place] Do not drive yourself to the hospital.  Other signs of stroke may include: ? A sudden, severe headache with no known cause. ? Nausea or vomiting. ? Seizure.  Where to find more information: For more information, visit:  American Stroke Association: www.strokeassociation.org  National Stroke Association: www.stroke.org  Summary  You can prevent a stroke by eating healthy, exercising, not smoking, limiting alcohol  intake, and managing any medical conditions you may have.  Do not use any products that contain nicotine or tobacco, such as cigarettes and e-cigarettes. If you need help quitting, ask your health care provider. It may also be helpful to avoid exposure to secondhand smoke.  Remember BEFAST for warning signs of stroke. Get help right away if you or a loved one has any of these signs. This information is not intended to replace advice given to you by your health care provider. Make sure you discuss any questions you have with your health care provider. Document Released: 04/09/2004 Document Revised: 04/07/2016 Document Reviewed: 04/07/2016 Elsevier Interactive Patient Education  [employer]2018 Elsevier Inc.

## 2017-10-27 DIAGNOSIS — F410 Panic disorder [episodic paroxysmal anxiety] without agoraphobia: Secondary | ICD-10-CM | POA: Diagnosis not present

## 2017-11-11 ENCOUNTER — Ambulatory Visit (INDEPENDENT_AMBULATORY_CARE_PROVIDER_SITE_OTHER): Payer: BLUE CROSS/BLUE SHIELD | Admitting: Pulmonary Disease

## 2017-11-11 ENCOUNTER — Ambulatory Visit: Payer: BLUE CROSS/BLUE SHIELD | Admitting: Pulmonary Disease

## 2017-11-11 ENCOUNTER — Encounter: Payer: Self-pay | Admitting: Pulmonary Disease

## 2017-11-11 VITALS — BP 120/70 | HR 77 | Ht 70.0 in | Wt 219.0 lb

## 2017-11-11 DIAGNOSIS — R0600 Dyspnea, unspecified: Secondary | ICD-10-CM

## 2017-11-11 DIAGNOSIS — J439 Emphysema, unspecified: Secondary | ICD-10-CM

## 2017-11-11 LAB — PULMONARY FUNCTION TEST
DL/VA % pred: 106 %
DL/VA: 4.95 ml/min/mmHg/L
DLCO UNC % PRED: 86 %
DLCO UNC: 27.92 ml/min/mmHg
FEF 25-75 POST: 3.72 L/s
FEF 25-75 Pre: 3.11 L/sec
FEF2575-%CHANGE-POST: 19 %
FEF2575-%PRED-POST: 104 %
FEF2575-%PRED-PRE: 87 %
FEV1-%Change-Post: 3 %
FEV1-%PRED-POST: 83 %
FEV1-%Pred-Pre: 80 %
FEV1-POST: 3.34 L
FEV1-Pre: 3.21 L
FEV1FVC-%CHANGE-POST: 5 %
FEV1FVC-%PRED-PRE: 103 %
FEV6-%CHANGE-POST: -1 %
FEV6-%Pred-Post: 79 %
FEV6-%Pred-Pre: 80 %
FEV6-PRE: 3.98 L
FEV6-Post: 3.93 L
FEV6FVC-%Change-Post: 0 %
FEV6FVC-%Pred-Post: 102 %
FEV6FVC-%Pred-Pre: 102 %
FVC-%Change-Post: -1 %
FVC-%PRED-POST: 77 %
FVC-%Pred-Pre: 78 %
FVC-Post: 3.93 L
FVC-Pre: 3.98 L
POST FEV1/FVC RATIO: 85 %
PRE FEV1/FVC RATIO: 81 %
Post FEV6/FVC ratio: 100 %
Pre FEV6/FVC Ratio: 100 %
RV % PRED: 83 %
RV: 1.66 L
TLC % PRED: 83 %
TLC: 5.82 L

## 2017-11-11 NOTE — Progress Notes (Signed)
Patient completed full PFT today. 

## 2017-11-11 NOTE — Patient Instructions (Signed)
I am glad that your breathing is stable We will call in an albuterol inhaler to be used as needed. Your lung function tests look normal with no evidence of COPD You can follow-up with us as needed.

## 2017-11-11 NOTE — Progress Notes (Signed)
Hollice GongMichael T Shelton    [id number]    Nov 19, 1970  Primary Care Physician:Cummings, Bernerd Phoharley Elizabeth, PA-C  Referring Physician: Riki RuskCummings, Charley Elizabeth, PA-C [address] [place], [place] [id number]  Chief complaint: Follow up for emphysema evaluation, abnormal spirometry  HPI: 47 year old with agoraphobia, anxiety, CVA.  He had a CT head and neck for work-up of his CVA in 2018 which reported emphysema in the apex of the lung.  He is followed up with primary care with recent spirometry which did not show any obstruction and possibly restriction Complains of dyspnea with activity.  Denies symptoms at rest.  No cough, sputum production, wheezing, chills.  Pets: 2 dogs.  No birds, cats, farm animals Occupation: Works as a Investment banker, corporateproperty manager Exposures: No known exposures, no dampness, mold, Jacuzzi, hot Smoking history: Never smoked.  Exposed to secondhand smoke Travel history: No significant travel Relevant family history: No family history of lung issues  Interim history:  No change in symptoms.  He has mild dyspnea on exertion.  No symptoms at rest Here for review of PFTs.  States that the albuterol he received during the test opened up his lungs with improvement in breathing.  Physical Exam: Blood pressure 120/70, pulse 77, height 5\' 10"  (1.778 m), weight 219 lb (99.3 kg), SpO2 97 %. Gen:      No acute distress HEENT:  EOMI, sclera anicteric Neck:     No masses; no thyromegaly Lungs:    Clear to auscultation bilaterally; normal respiratory effort CV:         Regular rate and rhythm; no murmurs Abd:      + bowel sounds; soft, non-tender; no palpable masses, no distension Ext:    No edema; adequate peripheral perfusion Skin:      Warm and dry; no rash Neuro: alert and oriented x 3 Psych: normal mood and affect  Data Reviewed: Imaging CT head and neck 07/02/2016- apical lung images  emphysema. Chest x-ray 07/03/2016-minimal left basal atelectasis, low lung  volumes Chest x-ray 07/26/2017- left base atelectasis I have reviewed the images personally.   PFTs Spirometry 06/30/2017 FVC 3.59 [66%], FEV1 3.02 [71%], F/F 84%, FEF 25-75 3.45 [92%] No obstruction, restriction possible based on reduction in FVC, FEV1  PFTs 10/22/2017 FVC 3.93 (1 7%], FEV1 3.34 [83%), F/F 85, TLC 83%, DLCO 86% Normal study  Assessment:  Consult for emphysema, abnormal spirometry CT scan in 2018 is read as emphysema at the lung apex however on my review this does not appear to be significant.  He is a never smoker although he is been exposed to secondhand smoke Spirometry shows no obstruction, possible restriction.  There is no evidence of interstitial lung disease on CXR.  Full PFTs reviewed with no obstruction, restriction or diffusion impairment.  Suspect dyspnea is mostly secondary to deconditioning, excessive weight. There is improvement in mid flow rates with albuterol suggestive of minimal small airway disease We will give him albuterol to be used as needed.  Follow-up in pulmonary clinic as needed.  Plan/Recommendations: - Albuterol inhaler as needed  Follow-up as needed   Outpatient Encounter Medications as of 11/11/2017  Medication Sig  . allopurinol (ZYLOPRIM) 300 MG tablet TAKE 1 TABLET BY MOUTH EVERY DAY  . amLODipine (NORVASC) 10 MG tablet Take 1 tablet (10 mg total) by mouth daily.  Marland Kitchen. aspirin 325 MG tablet Take 1 tablet (325 mg total) by mouth daily.  Marland Kitchen. atorvastatin (LIPITOR) 40 MG tablet Take 1  tablet (40 mg total) by mouth 2 (two) times daily.  Marland Kitchen buPROPion (WELLBUTRIN XL) 150 MG 24 hr tablet Take 1 tablet (150 mg total) by mouth daily.  . clonazePAM (KLONOPIN) 1 MG tablet Take 1-1.5 tablets (1-1.5 mg total) by mouth 2 (two) times daily.  . CO ENZYME Q-10 PO Take by mouth.  . metFORMIN (GLUCOPHAGE-XR) 500 MG 24 hr tablet TAKE 1 TABLET BY MOUTH EVERY DAY WITH BREAKFAST  . metoprolol (TOPROL-XL) 200 MG 24 hr tablet Take 1 tablet (200 mg total) by  mouth daily.  . Probiotic Product (PHILLIPS COLON HEALTH PO) Take by mouth.   No facility-administered encounter medications on file as of 11/11/2017.     Allergies as of 11/11/2017 - Review Complete 11/11/2017  Allergen Reaction Noted  . Edarbi [azilsartan] Other (See Comments) 11/03/2016  . Levaquin [levofloxacin in d5w] Itching and Swelling 04/26/2015  . Lisinopril Cough 11/03/2016  . Trazodone and nefazodone Rash 06/16/2017  . Valsartan Rash 11/03/2016    Past Medical History:  Diagnosis Date  . Anxiety   . Gout   . High cholesterol   . Hypertension   . Stroke [facility])     Past Surgical History:  Procedure Laterality Date  . NO PAST SURGERIES      Family History  Adopted: Yes  Problem Relation Age of Onset  . Stroke Neg Hx     Social History   Socioeconomic History  . Marital status: Single    Spouse name: Not on file  . Number of children: 0  . Years of education: Some college  . Highest education level: Not on file  Occupational History  . 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 Financial resource strain: Not on file  . Food insecurity:    Worry: Not on file    Inability: Not on file  . Transportation needs:    Medical: Not on file    Non-medical: Not on file  Tobacco Use  . Smoking status: Never Smoker  . Smokeless tobacco: Never Used  Substance and Sexual Activity  . Alcohol use: No  . Drug use: No  . Sexual activity: Yes  Lifestyle  . Physical activity:    Days per week: Not on file    Minutes per session: Not on file  . Stress: Not on file  Relationships  . Social connections:    Talks on phone: Not on file    Gets together: Not on file    Attends religious service: Not on file    Active member of club or organization: Not on file    Attends meetings of clubs or organizations: Not on file    Relationship status: Not on file  . Intimate partner violence:    Fear of current or ex partner: Not on file    Emotionally abused: Not on file      Physically abused: Not on file    Forced sexual activity: Not on file  Other Topics Concern  . Not on file  Social History Narrative   Lives w/ his partner   Right-handed   Caffeine: none   Review of systems: Review of Systems  Constitutional: Negative for fever and chills.  HENT: Negative.   Eyes: Negative for blurred vision.  Respiratory: as per HPI  Cardiovascular: Negative for chest pain and palpitations.  Gastrointestinal: Negative for vomiting, diarrhea, blood per rectum. Genitourinary: Negative for dysuria, urgency, frequency and hematuria.  Musculoskeletal: Negative for myalgias, back pain and joint pain.  Skin: Negative  for itching and rash.  Neurological: Negative for dizziness, tremors, focal weakness, seizures and loss of consciousness.  Endo/Heme/Allergies: Negative for environmental allergies.  Psychiatric/Behavioral: Negative for depression, suicidal ideas and hallucinations.  All other systems reviewed and are negative.  Chilton Greathouse MD [facility] 11/11/2017, 3:10 PM  CC: Donzetta Kohut*

## 2017-11-12 ENCOUNTER — Other Ambulatory Visit: Payer: Self-pay | Admitting: Physician Assistant

## 2017-11-22 ENCOUNTER — Encounter: Payer: Self-pay | Admitting: Physician Assistant

## 2017-11-22 ENCOUNTER — Other Ambulatory Visit: Payer: Self-pay

## 2017-11-22 ENCOUNTER — Other Ambulatory Visit: Payer: Self-pay | Admitting: Physician Assistant

## 2017-11-22 DIAGNOSIS — I2510 Atherosclerotic heart disease of native coronary artery without angina pectoris: Secondary | ICD-10-CM

## 2017-11-22 DIAGNOSIS — I10 Essential (primary) hypertension: Secondary | ICD-10-CM

## 2017-11-22 DIAGNOSIS — E785 Hyperlipidemia, unspecified: Secondary | ICD-10-CM

## 2017-11-22 MED ORDER — ATORVASTATIN CALCIUM 80 MG PO TABS: 80.0000 mg | ORAL_TABLET | Freq: Every day | ORAL | 0 refills | Status: DC

## 2017-11-24 ENCOUNTER — Other Ambulatory Visit: Payer: Self-pay | Admitting: Physician Assistant

## 2017-11-24 DIAGNOSIS — I2510 Atherosclerotic heart disease of native coronary artery without angina pectoris: Secondary | ICD-10-CM

## 2017-11-24 DIAGNOSIS — E785 Hyperlipidemia, unspecified: Secondary | ICD-10-CM

## 2017-11-24 MED ORDER — ATORVASTATIN CALCIUM 40 MG PO TABS: 80.0000 mg | ORAL_TABLET | Freq: Every day | ORAL | 1 refills | Status: DC

## 2017-12-14 DIAGNOSIS — F410 Panic disorder [episodic paroxysmal anxiety] without agoraphobia: Secondary | ICD-10-CM | POA: Diagnosis not present

## 2017-12-27 ENCOUNTER — Other Ambulatory Visit: Payer: Self-pay | Admitting: Physician Assistant

## 2017-12-27 DIAGNOSIS — R7303 Prediabetes: Secondary | ICD-10-CM

## 2018-01-20 DIAGNOSIS — F410 Panic disorder [episodic paroxysmal anxiety] without agoraphobia: Secondary | ICD-10-CM | POA: Diagnosis not present

## 2018-01-31 ENCOUNTER — Other Ambulatory Visit: Payer: Self-pay | Admitting: Physician Assistant

## 2018-01-31 DIAGNOSIS — F4001 Agoraphobia with panic disorder: Secondary | ICD-10-CM

## 2018-02-07 ENCOUNTER — Other Ambulatory Visit: Payer: Self-pay | Admitting: Physician Assistant

## 2018-02-13 ENCOUNTER — Other Ambulatory Visit: Payer: Self-pay | Admitting: Physician Assistant

## 2018-02-13 DIAGNOSIS — I2510 Atherosclerotic heart disease of native coronary artery without angina pectoris: Secondary | ICD-10-CM

## 2018-02-13 DIAGNOSIS — E785 Hyperlipidemia, unspecified: Secondary | ICD-10-CM

## 2018-02-21 ENCOUNTER — Encounter: Payer: Self-pay | Admitting: Physician Assistant

## 2018-02-22 ENCOUNTER — Ambulatory Visit: Payer: BLUE CROSS/BLUE SHIELD | Admitting: Physician Assistant

## 2018-02-22 ENCOUNTER — Encounter: Payer: Self-pay | Admitting: Physician Assistant

## 2018-02-22 VITALS — BP 125/85 | HR 72 | Wt 224.0 lb

## 2018-02-22 DIAGNOSIS — E785 Hyperlipidemia, unspecified: Secondary | ICD-10-CM

## 2018-02-22 DIAGNOSIS — R740 Nonspecific elevation of levels of transaminase and lactic acid dehydrogenase [LDH]: Secondary | ICD-10-CM

## 2018-02-22 DIAGNOSIS — Z79899 Other long term (current) drug therapy: Secondary | ICD-10-CM

## 2018-02-22 DIAGNOSIS — I10 Essential (primary) hypertension: Secondary | ICD-10-CM | POA: Diagnosis not present

## 2018-02-22 DIAGNOSIS — E8881 Metabolic syndrome: Secondary | ICD-10-CM

## 2018-02-22 DIAGNOSIS — Z8739 Personal history of other diseases of the musculoskeletal system and connective tissue: Secondary | ICD-10-CM

## 2018-02-22 DIAGNOSIS — R7401 Elevation of levels of liver transaminase levels: Secondary | ICD-10-CM

## 2018-02-22 NOTE — Progress Notes (Signed)
HPI:                                                                Jack [name] is a 47 y.o. male who presents to [name]: [facility] today for medication management  Denies any health concerns. He is coming in today 1 month early before insurance change to get his labs completed.   HTN: taking Amlodipine 10 mg and Metoprolol XL 200 mg daily. Compliant with medications. Checks BP's at home. BP range 130's/80's. Denies vision change, headache, chest pain with exertion, orthopnea, lightheadedness, syncope and edema. Risk factors include: hx of CVA  GAD/Agoraphobia: doing well on Clonazepam 1 mg bid. No recent panic attacks. Functioning well at current job.  Prediabetes: taking Metformin XR daily. He was having some GI upset, but found that taking probiotics and dosing the medication at night was helpful.  Hx of CVA: taking Atorvastatin 40 mg bid and aspirin 325 mg.    Depression screen [facility] 2/9 02/22/2018 07/16/2017 04/09/2017 11/03/2016  Decreased Interest 0 0 0 0  Down, Depressed, Hopeless 0 0 0 0  PHQ - 2 Score 0 0 0 0  Altered sleeping 0 0 1 -  Tired, decreased energy 0 0 0 -  Change in appetite 0 0 0 -  Feeling bad or failure about yourself  0 0 0 -  Trouble concentrating 0 0 0 -  Moving slowly or fidgety/restless 0 0 0 -  Suicidal thoughts 0 0 0 -  PHQ-9 Score 0 0 1 -    GAD 7 : Generalized Anxiety Score 02/22/2018 07/16/2017 04/09/2017  Nervous, Anxious, on Edge 1 0 3  Control/stop worrying 1 0 3  Worry too much - different things 1 1 3   Trouble relaxing 0 0 3  Restless 0 0 2  Easily annoyed or irritable 0 1 2  Afraid - awful might happen 1 0 3  Total GAD 7 Score 4 2 19   Anxiety Difficulty Somewhat difficult - -      Past Medical History:  Diagnosis Date  . Anxiety   . Gout   . High cholesterol   . Hypertension   . Stroke [facility])    Past Surgical History:  Procedure Laterality Date  . NO PAST SURGERIES     Social  History   Tobacco Use  . Smoking status: Never Smoker  . Smokeless tobacco: Never Used  Substance Use Topics  . Alcohol use: No   family history is not on file. He was adopted.    ROS: negative except as noted in the HPI  Medications: Current Outpatient Medications  Medication Sig Dispense Refill  . allopurinol (ZYLOPRIM) 300 MG tablet TAKE 1 TABLET BY MOUTH EVERY DAY 90 tablet 0  . amLODipine (NORVASC) 10 MG tablet Take 1 tablet (10 mg total) by mouth daily. 90 tablet 1  . aspirin 325 MG tablet Take 1 tablet (325 mg total) by mouth daily.    Marland Kitchen atorvastatin (LIPITOR) 40 MG tablet Take 2 tablets (80 mg total) by mouth at bedtime. 180 tablet 1  . buPROPion (WELLBUTRIN XL) 150 MG 24 hr tablet Take 1 tablet (150 mg total) by mouth daily. 90 tablet 1  . clonazePAM (KLONOPIN) 1 MG tablet TAKE 1-1.5  TABLETS BY MOUTH 2 (TWO) TIMES DAILY. 70 tablet 2  . CO ENZYME Q-10 PO Take by mouth.    . metFORMIN (GLUCOPHAGE-XR) 500 MG 24 hr tablet TAKE 1 TABLET BY MOUTH EVERY DAY WITH BREAKFAST 90 tablet 0  . metoprolol (TOPROL-XL) 200 MG 24 hr tablet Take 1 tablet (200 mg total) by mouth daily. 90 tablet 1  . Probiotic Product (PHILLIPS COLON HEALTH PO) Take by mouth.     No current facility-administered medications for this visit.    Allergies  Allergen Reactions  . Levaquin [Levofloxacin In D5w] Itching and Swelling  . Edarbi [Azilsartan] Other (See Comments)    Depressed mood  . Lisinopril Cough  . Trazodone And Nefazodone Rash  . Valsartan Rash       Objective:  BP 125/85   Pulse 72   Wt 224 lb (101.6 kg)   BMI 32.14 kg/m  Gen:  alert, not ill-appearing, no distress, appropriate for age, obese male HEENT: head normocephalic without obvious abnormality, conjunctiva and cornea clear, wearing glasses, trachea midline Pulm: Normal work of breathing, normal phonation, clear to auscultation bilaterally, no wheezes, rales or rhonchi CV: Normal rate, regular rhythm, s1 and s2 distinct, no  murmurs, clicks or rubs  Neuro: alert and oriented x 3, no tremor MSK: extremities atraumatic, normal gait and station, 1+ right-sided peripheral edema Skin: intact, no rashes on exposed skin, no jaundice, no cyanosis Psych: well-groomed, cooperative, good eye contact, euthymic mood, affect mood-congruent, speech is articulate, and thought processes clear and goal-directed  Wt Readings from Last 3 Encounters:  02/22/18 224 lb (101.6 kg)  11/11/17 219 lb (99.3 kg)  10/26/17 221 lb (100.2 kg)    Lab Results  Component Value Date   CREATININE 0.79 02/22/2018   BUN 13 02/22/2018   NA 140 02/22/2018   K 4.5 02/22/2018   CL 103 02/22/2018   CO2 25 02/22/2018   Lab Results  Component Value Date   ALT 92 (H) 02/22/2018   AST 43 (H) 02/22/2018   ALKPHOS 67 07/02/2016   BILITOT 0.6 02/22/2018   Lab Results  Component Value Date   WBC 8.2 02/22/2018   HGB 14.8 02/22/2018   HCT 43.8 02/22/2018   MCV 88.8 02/22/2018   PLT 252 02/22/2018   Lab Results  Component Value Date   CHOL 165 02/22/2018   HDL 35 (L) 02/22/2018   LDLCALC 100 (H) 02/22/2018   TRIG 178 (H) 02/22/2018   CHOLHDL 4.7 02/22/2018      Assessment and Plan: 47 y.o. male with   .[name] was seen today for medication management.  Diagnoses and all orders for this visit:  Encounter for long-term (current) use of medications -     COMPLETE METABOLIC PANEL WITH GFR -     Hemoglobin A1c -     CBC -     Lipid Panel w/reflex Direct LDL -     Uric acid  Hyperlipidemia LDL goal <70 -     Lipid Panel w/reflex Direct LDL  Hypertension goal BP (blood pressure) < 130/80 -     COMPLETE METABOLIC PANEL WITH GFR  Metabolic syndrome X -     Hemoglobin A1c -     Lipid Panel w/reflex Direct LDL  History of gout -     Uric acid  Transaminitis -     US Abdomen Limited RUQ; Future  Chronic use of benzodiazepine for therapeutic purpose   BP goal <130/80 Mildly out of range today Counseled on therapeutic  lifestyle changes Cont ASA for secondary prevention Also noted weight gain of approx 5 pounds in the last 4 months Counseled on weight loss through decreased caloric intake and increased aerobic exercise Fasting labs pending  Patient education and anticipatory guidance given Patient agrees with treatment plan Follow-up in 6 months for medication management or sooner as needed if symptoms worsen or fail to improve  Levonne Hubertharley E. Cummings PA-C

## 2018-02-22 NOTE — Patient Instructions (Signed)
For your blood pressure: - Goal <130/80 (Ideally 120's/70's) - take your blood pressure medication in the morning (unless instructed differently) - monitor and log blood pressures at home - check around the same time each day in a relaxed setting - Limit salt to <2500 mg/day - Follow DASH (Dietary Approach to Stopping Hypertension) eating plan - Try to get at least 150 minutes of aerobic exercise per week - Aim to go on a brisk walk 30 minutes per day at least 5 days per week. If you're not active, gradually increase how long you walk by 5 minutes each week - limit alcohol: 2 standard drinks per day for men and 1 per day for women - avoid tobacco/nicotine products. Consider smoking cessation if you smoke - weight loss: 7% of current body weight can reduce your blood pressure by 5-10 points - follow-up at least every 6 months for your blood pressure. Follow-up sooner if your BP is not controlled   Prediabetes Eating Plan Prediabetes-also called impaired glucose tolerance or impaired fasting glucose-is a condition that causes blood sugar (blood glucose) levels to be higher than normal. Following a healthy diet can help to keep prediabetes under control. It can also help to lower the risk of type 2 diabetes and heart disease, which are increased in people who have prediabetes. Along with regular exercise, a healthy diet:  Promotes weight loss.  Helps to control blood sugar levels.  Helps to improve the way that the body uses insulin.  What do I need to know about this eating plan?  Use the glycemic index (GI) to plan your meals. The index tells you how quickly a food will raise your blood sugar. Choose low-GI foods. These foods take a longer time to raise blood sugar.  Pay close attention to the amount of carbohydrates in the food that you eat. Carbohydrates increase blood sugar levels.  Keep track of how many calories you take in. Eating the right amount of calories will help you to  achieve a healthy weight. Losing about 7 percent of your starting weight can help to prevent type 2 diabetes.  You may want to follow a Mediterranean diet. This diet includes a lot of vegetables, lean meats or fish, whole grains, fruits, and healthy oils and fats. What foods can I eat? Grains Whole grains, such as whole-wheat or whole-grain breads, crackers, cereals, and pasta. Unsweetened oatmeal. Bulgur. Barley. Quinoa. Brown rice. Corn or whole-wheat flour tortillas or taco shells. Vegetables Lettuce. Spinach. Peas. Beets. Cauliflower. Cabbage. Broccoli. Carrots. Tomatoes. Squash. Eggplant. Herbs. Peppers. Onions. Cucumbers. Brussels sprouts. Fruits Berries. Bananas. Apples. Oranges. Grapes. Papaya. Mango. Pomegranate. Kiwi. Grapefruit. Cherries. Meats and Other Protein Sources Seafood. Lean meats, such as chicken and [place] or lean cuts of pork and beef. Tofu. Eggs. Nuts. Beans. Dairy Low-fat or fat-free dairy products, such as yogurt, cottage cheese, and cheese. Beverages Water. Tea. Coffee. Sugar-free or diet soda. Seltzer water. Milk. Milk alternatives, such as soy or almond milk. Condiments Mustard. Relish. Low-fat, low-sugar ketchup. Low-fat, low-sugar barbecue sauce. Low-fat or fat-free mayonnaise. Sweets and Desserts Sugar-free or low-fat pudding. Sugar-free or low-fat ice cream and other frozen treats. Fats and Oils Avocado. Walnuts. Olive oil. The items listed above may not be a complete list of recommended foods or beverages. Contact your dietitian for more options. What foods are not recommended? Grains Refined white flour and flour products, such as bread, pasta, snack foods, and cereals. Beverages Sweetened drinks, such as sweet iced tea and soda. Sweets and Desserts  Baked goods, such as cake, cupcakes, pastries, cookies, and cheesecake. The items listed above may not be a complete list of foods and beverages to avoid. Contact your dietitian for more information. This  information is not intended to replace advice given to you by your health care provider. Make sure you discuss any questions you have with your health care provider. Document Released: 07/17/2014 Document Revised: 08/08/2015 Document Reviewed: 03/28/2014 Elsevier Interactive Patient Education  2017 [employer].

## 2018-02-23 LAB — COMPLETE METABOLIC PANEL WITH GFR
AG RATIO: 1.7 (calc) (ref 1.0–2.5)
ALBUMIN MSPROF: 4.6 g/dL (ref 3.6–5.1)
ALT: 92 U/L — ABNORMAL HIGH (ref 9–46)
AST: 43 U/L — AB (ref 10–40)
Alkaline phosphatase (APISO): 85 U/L (ref 40–115)
BILIRUBIN TOTAL: 0.6 mg/dL (ref 0.2–1.2)
BUN: 13 mg/dL (ref 7–25)
CALCIUM: 9.9 mg/dL (ref 8.6–10.3)
CHLORIDE: 103 mmol/L (ref 98–110)
CO2: 25 mmol/L (ref 20–32)
Creat: 0.79 mg/dL (ref 0.60–1.35)
GFR, EST AFRICAN AMERICAN: 123 mL/min/{1.73_m2} (ref >=60)
GFR, EST NON AFRICAN AMERICAN: 106 mL/min/{1.73_m2} (ref >=60)
GLOBULIN: 2.7 g/dL (ref 1.9–3.7)
Glucose, Bld: 100 mg/dL — ABNORMAL HIGH (ref 65–99)
Potassium: 4.5 mmol/L (ref 3.5–5.3)
SODIUM: 140 mmol/L (ref 135–146)
TOTAL PROTEIN: 7.3 g/dL (ref 6.1–8.1)

## 2018-02-23 LAB — CBC
HEMATOCRIT: 43.8 % (ref 38.5–50.0)
Hemoglobin: 14.8 g/dL (ref 13.2–17.1)
MCH: 30 pg (ref 27.0–33.0)
MCHC: 33.8 g/dL (ref 32.0–36.0)
MCV: 88.8 fL (ref 80.0–100.0)
MPV: 11.7 fL (ref 7.5–12.5)
Platelets: 252 10*3/uL (ref 140–400)
RBC: 4.93 10*6/uL (ref 4.20–5.80)
RDW: 12.6 % (ref 11.0–15.0)
WBC: 8.2 10*3/uL (ref 3.8–10.8)

## 2018-02-23 LAB — LIPID PANEL W/REFLEX DIRECT LDL
CHOLESTEROL: 165 mg/dL (ref <200)
HDL: 35 mg/dL — ABNORMAL LOW (ref >40)
LDL CHOLESTEROL (CALC): 100 mg/dL — AB
NON-HDL CHOLESTEROL (CALC): 130 mg/dL — AB (ref <130)
Total CHOL/HDL Ratio: 4.7 (calc) (ref <5.0)
Triglycerides: 178 mg/dL — ABNORMAL HIGH (ref <150)

## 2018-02-23 LAB — HEMOGLOBIN A1C
Hgb A1c MFr Bld: 6 % of total Hgb — ABNORMAL HIGH (ref <5.7)
MEAN PLASMA GLUCOSE: 126 (calc)
eAG (mmol/L): 7 (calc)

## 2018-02-23 LAB — URIC ACID: URIC ACID, SERUM: 4.3 mg/dL (ref 4.0–8.0)

## 2018-02-23 NOTE — Progress Notes (Signed)
Hi Jack [name],  Your liver enzymes are increased again.  Not as bad compared to 1 year ago. Would recommend we get a liver ultrasound to make sure everything looks okay. I don't want you to worry. This is most likely fatty liver from being overweight. We just need to manage this with a low-fat diet, avoiding alcohol, increasing aerobic exercise and losing some weight.  I would still encourage you to consider Repatha for your cholesterol. Your LDL is 100, and your goal is less than 70 to help reduce risk of stroke.  Your A1c is increased, but it is still in the prediabetic range. Would encourage you to consider enrolling in the [facility] Preventing Diabetes seminar I gave you info about!  Warm regards, Vinetta Bergamoharley

## 2018-02-24 ENCOUNTER — Encounter: Payer: Self-pay | Admitting: Physician Assistant

## 2018-02-27 ENCOUNTER — Other Ambulatory Visit: Payer: Self-pay | Admitting: Physician Assistant

## 2018-02-27 ENCOUNTER — Encounter: Payer: Self-pay | Admitting: Physician Assistant

## 2018-02-27 DIAGNOSIS — R740 Nonspecific elevation of levels of transaminase and lactic acid dehydrogenase [LDH]: Principal | ICD-10-CM

## 2018-02-27 DIAGNOSIS — R7401 Elevation of levels of liver transaminase levels: Secondary | ICD-10-CM

## 2018-03-01 IMAGING — CT CT HEAD W/O CM
3 of 4 series · 16 of 47 positions shown, 19 images · non-contrast
Comparison: CT head 07/02/2016

CLINICAL DATA: 24 hour  post tPA for stroke

EXAM:
CT HEAD WITHOUT CONTRAST
TECHNIQUE: Contiguous axial images were obtained from the base of the skull
through the vertex without intravenous contrast.

[Series 201: head w/o, idose (1) · axial · non-contrast · 0.49mm/px · z∈[+115,+275]mm · 10 of 38 slices shown, 13 images]
[im 3/38  brain]
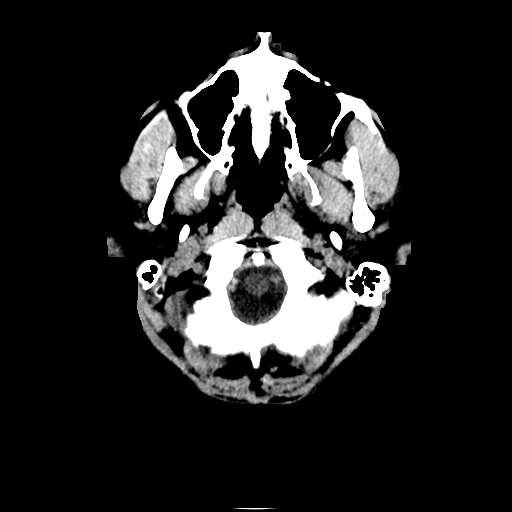
[im 3/38  bone]
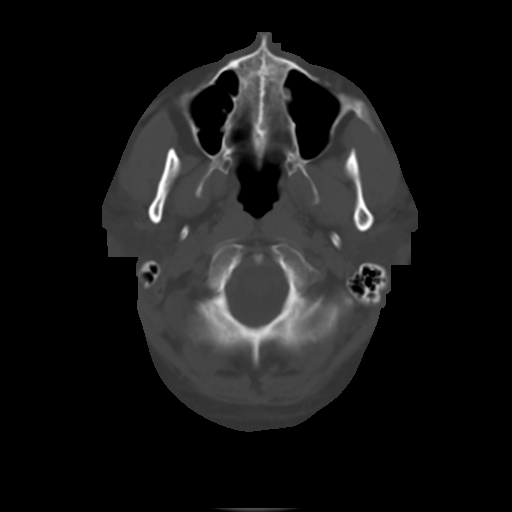
[im 6/38  brain]
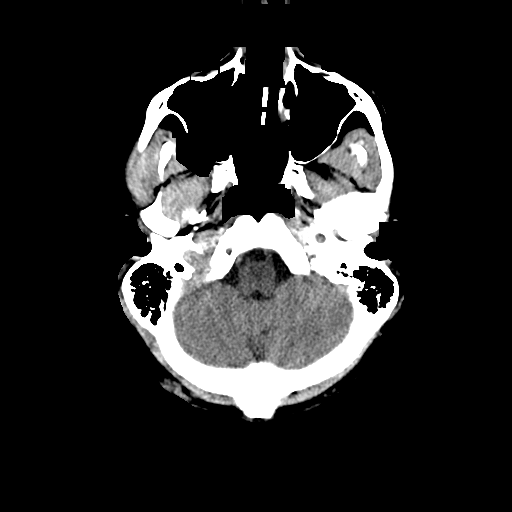
[im 11/38  brain]
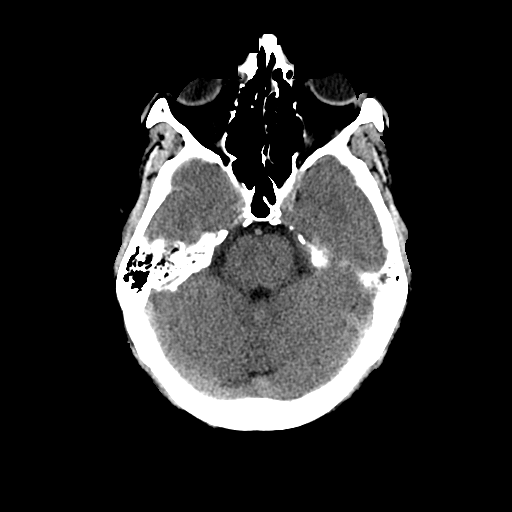
[im 14/38  brain]
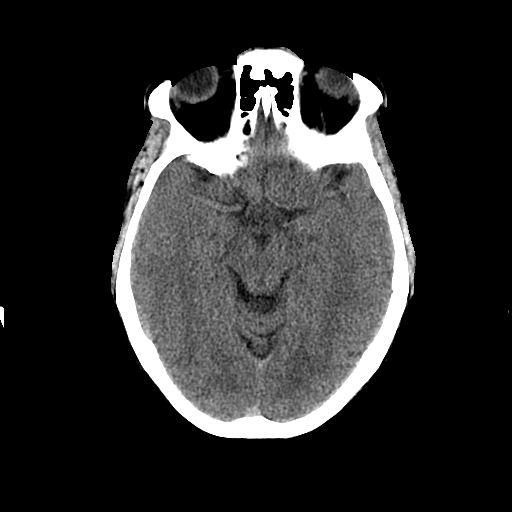
[im 16/38  brain]
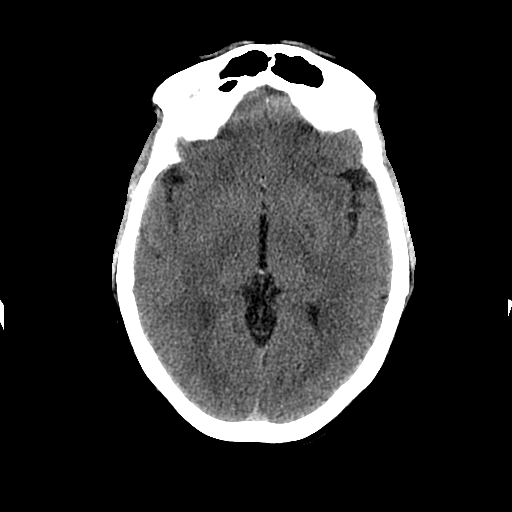
[im 16/38  bone]
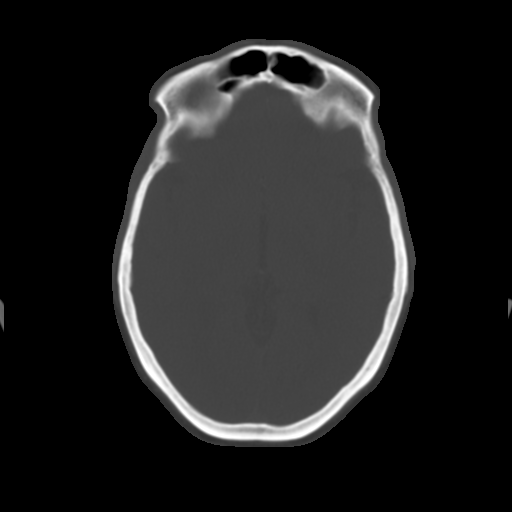
[im 22/38  brain]
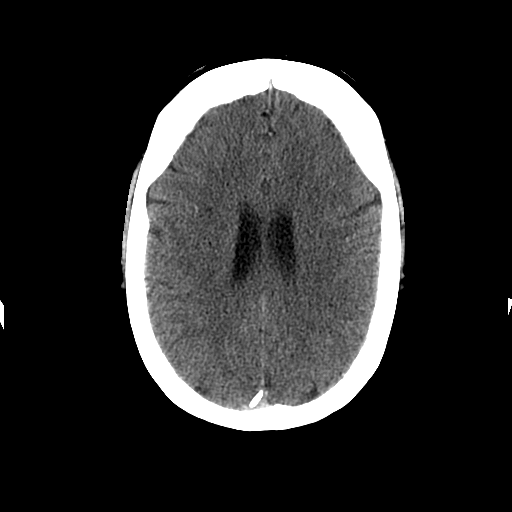
[im 24/38  brain]
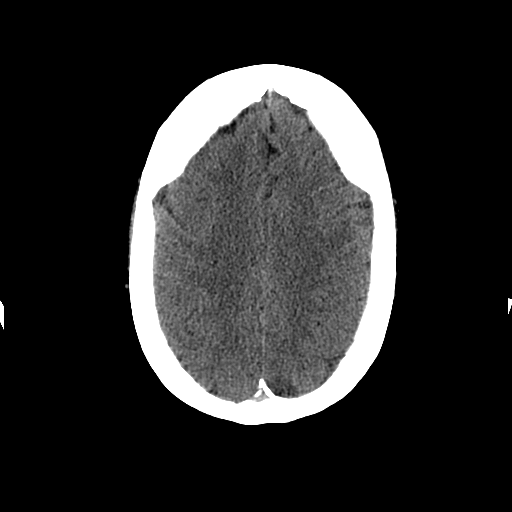
[im 27/38  brain]
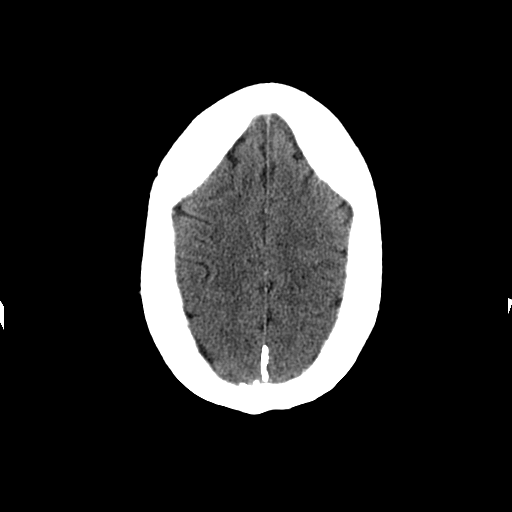
[im 32/38  brain]
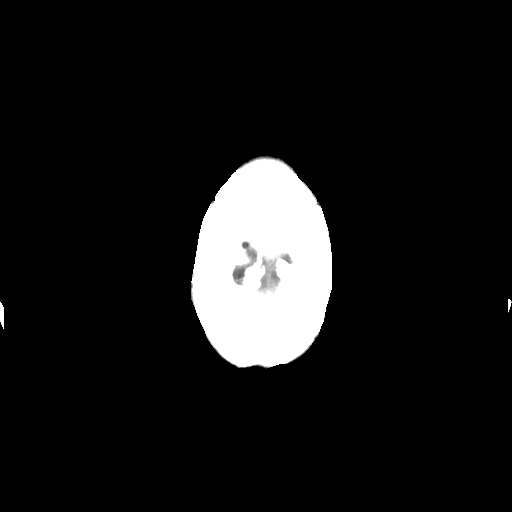
[im 32/38  bone]
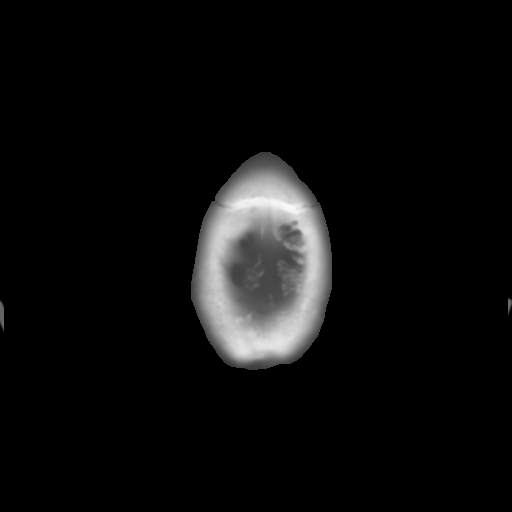
[im 35/38  brain]
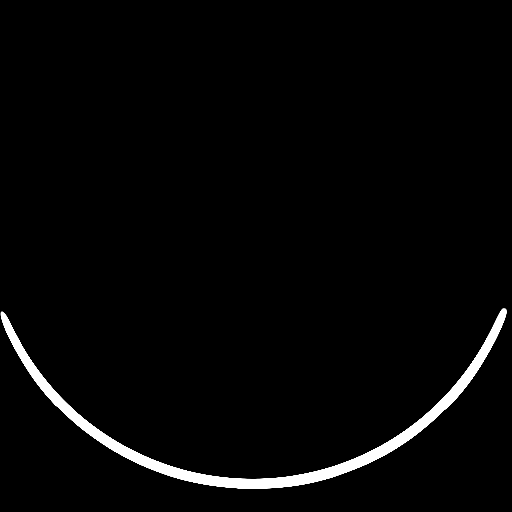

[Series 203: coronal st, idose (1) · coronal · 0.40mm/px · 3 of 81 slices shown]
[im 27/81  brain]
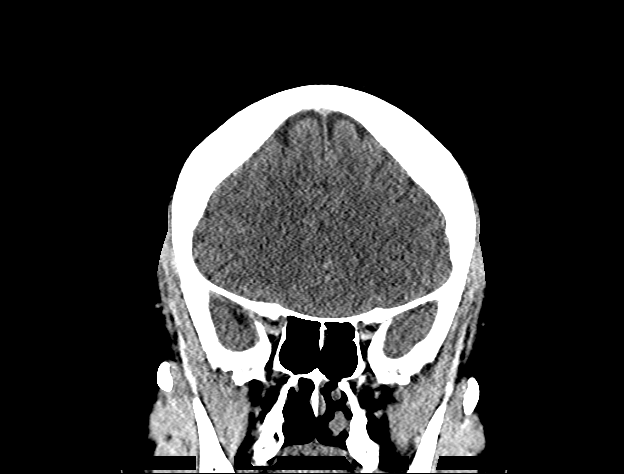
[im 36/81  brain]
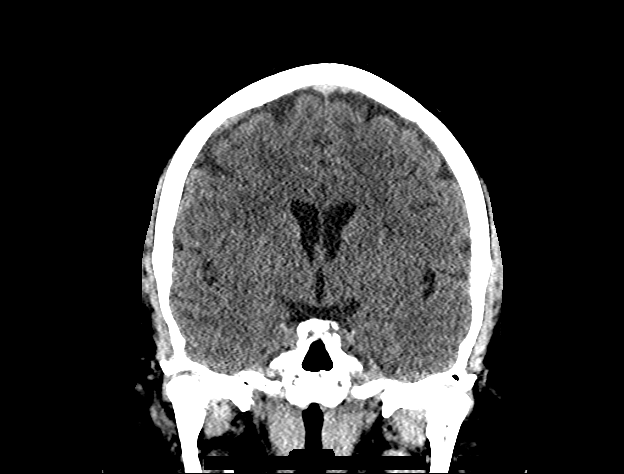
[im 45/81  brain]
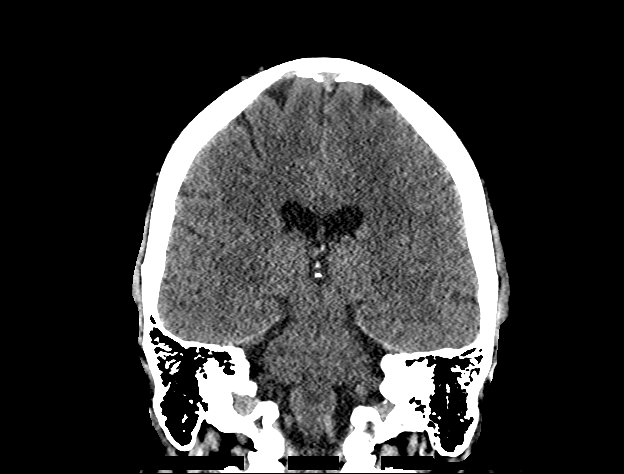

[Series 204: sagittal st, idose (1) · sagittal · 0.40mm/px · 3 of 83 slices shown]
[im 28/83  brain]
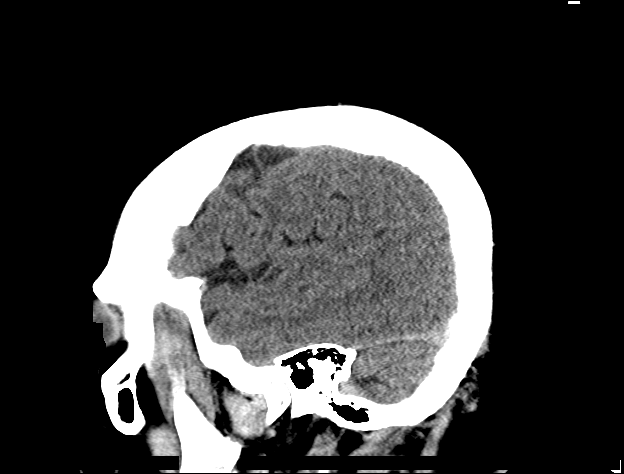
[im 42/83  brain]
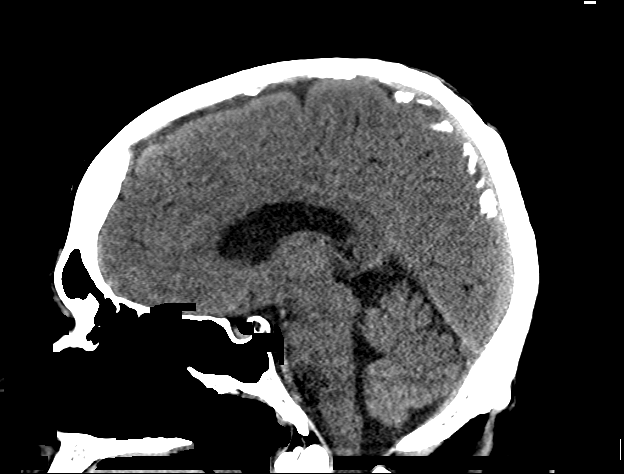
[im 55/83  brain]
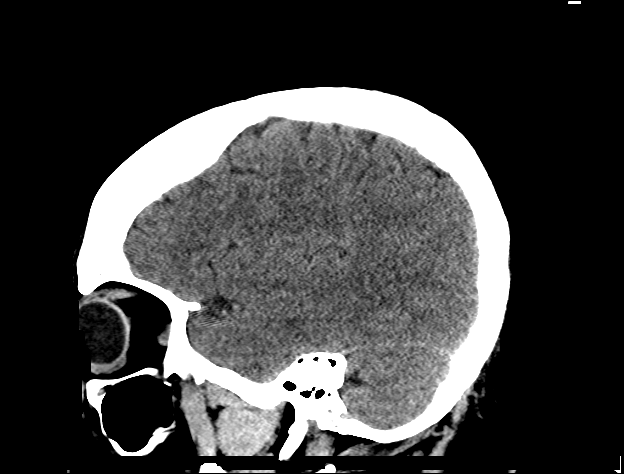

[16 of 47 positions shown; findings below may reference images not displayed]

FINDINGS: Brain: Negative for acute hemorrhage. Negative for acute infarct or
mass. Ventricle size normal without midline shift

Vascular: Negative for hyperdense vessel

Skull: Negative

Sinuses/Orbits: Negative

Other: None
IMPRESSION: Negative CT head.  Negative for hemorrhage post tPA

## 2018-03-01 IMAGING — CR DG CHEST 1V PORT
1 series · 1 of 1 positions shown · non-contrast
Comparison: None.

CLINICAL DATA: Shortness breath and tachycardia. Increased
hypertension.

EXAM:
PORTABLE CHEST 1 VIEW

[AP]
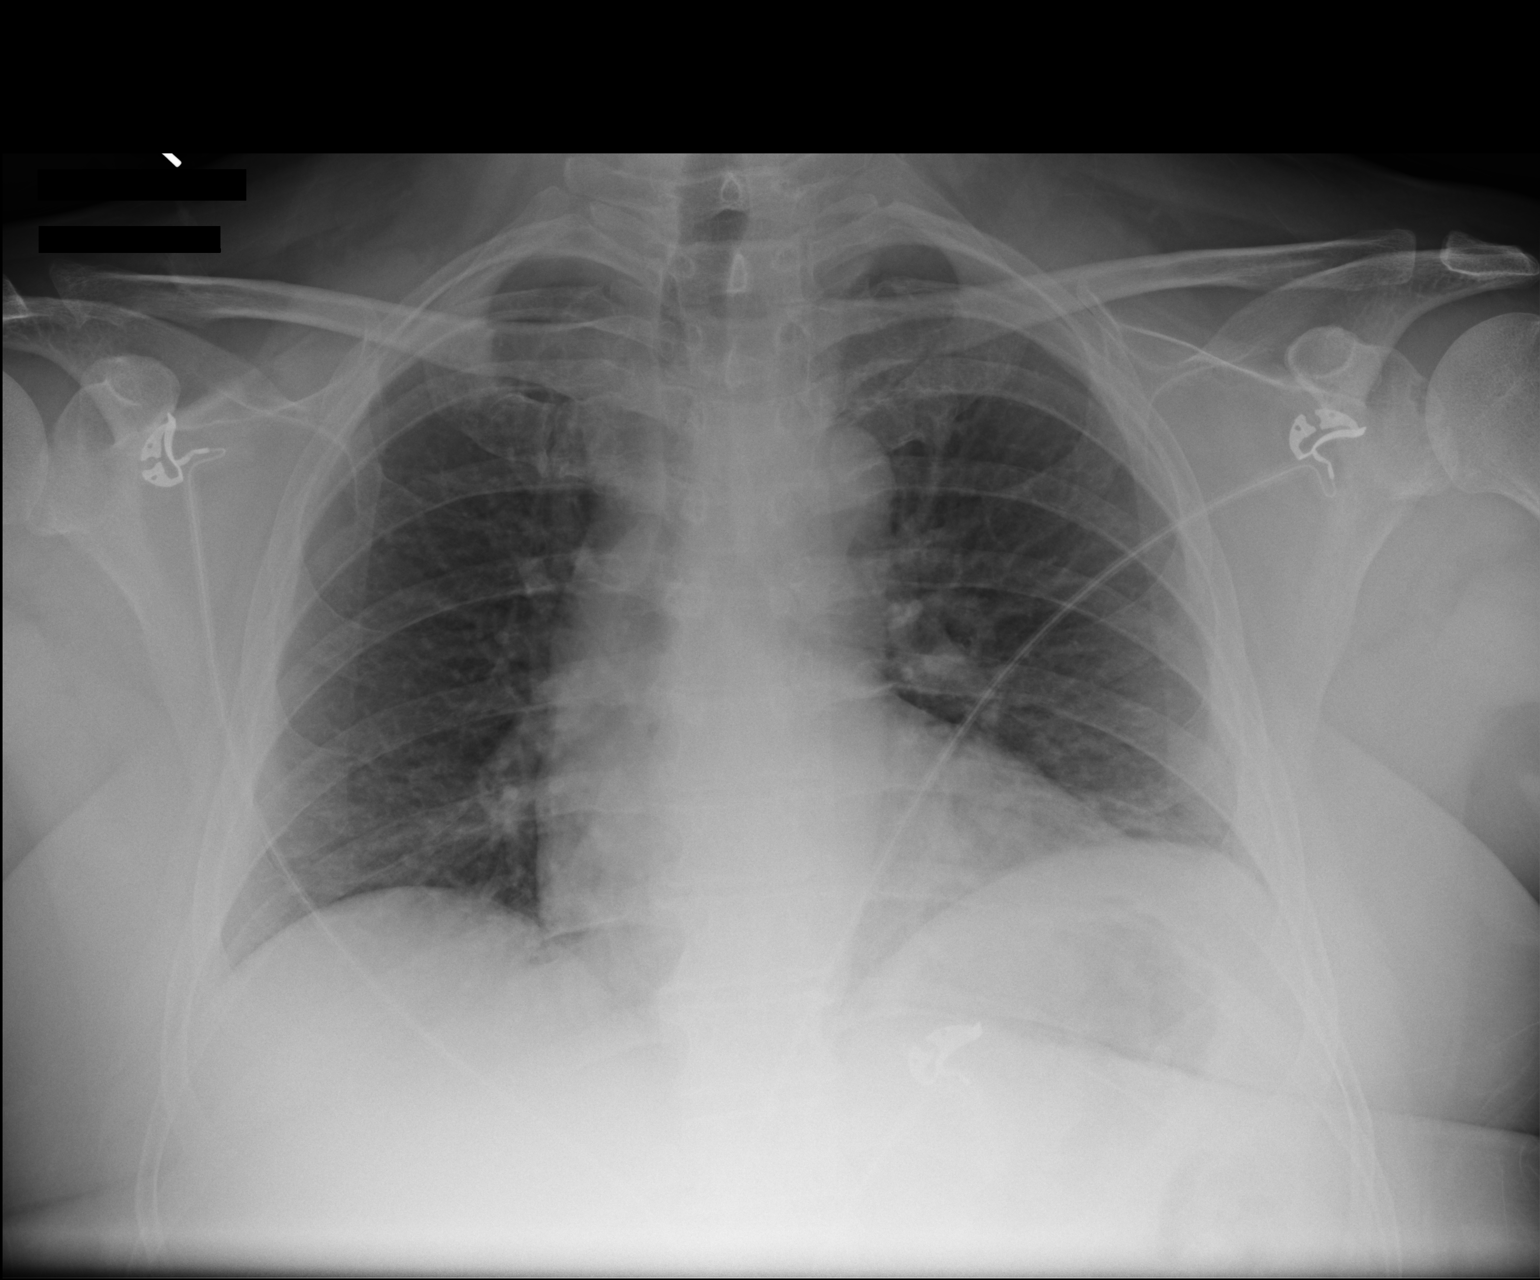

[1 of 1 positions shown; findings below may reference images not displayed]

FINDINGS: Heart size is normal. Asymmetric left basilar airspace opacity is
noted. Lung volumes are low. There is no edema or effusion.
IMPRESSION: 1. Minimal left basilar airspace disease likely reflects
atelectasis.
2. Low lung volumes.

## 2018-03-02 DIAGNOSIS — F410 Panic disorder [episodic paroxysmal anxiety] without agoraphobia: Secondary | ICD-10-CM | POA: Diagnosis not present

## 2018-03-11 ENCOUNTER — Other Ambulatory Visit: Payer: Self-pay

## 2018-03-22 ENCOUNTER — Ambulatory Visit
Admission: RE | Admit: 2018-03-22 | Discharge: 2018-03-22 | Disposition: A | Discharge status: Home / Self Care | Payer: BLUE CROSS/BLUE SHIELD | Source: Ambulatory Visit | Attending: Physician Assistant | Admitting: Physician Assistant

## 2018-03-22 DIAGNOSIS — R740 Nonspecific elevation of levels of transaminase and lactic acid dehydrogenase [LDH]: Principal | ICD-10-CM

## 2018-03-22 DIAGNOSIS — R7401 Elevation of levels of liver transaminase levels: Secondary | ICD-10-CM

## 2018-03-22 DIAGNOSIS — K8020 Calculus of gallbladder without cholecystitis without obstruction: Secondary | ICD-10-CM | POA: Diagnosis not present

## 2018-03-22 DIAGNOSIS — K760 Fatty (change of) liver, not elsewhere classified: Secondary | ICD-10-CM | POA: Diagnosis not present

## 2018-03-23 ENCOUNTER — Other Ambulatory Visit: Payer: Self-pay | Admitting: Physician Assistant

## 2018-03-23 DIAGNOSIS — R7303 Prediabetes: Secondary | ICD-10-CM

## 2018-03-28 ENCOUNTER — Ambulatory Visit: Payer: Self-pay | Admitting: Physician Assistant

## 2018-05-01 ENCOUNTER — Other Ambulatory Visit: Payer: Self-pay | Admitting: Physician Assistant

## 2018-05-01 DIAGNOSIS — I10 Essential (primary) hypertension: Secondary | ICD-10-CM

## 2018-05-01 DIAGNOSIS — F39 Unspecified mood [affective] disorder: Secondary | ICD-10-CM

## 2018-05-04 ENCOUNTER — Other Ambulatory Visit: Payer: Self-pay | Admitting: Physician Assistant

## 2018-05-04 DIAGNOSIS — F4001 Agoraphobia with panic disorder: Secondary | ICD-10-CM

## 2018-05-12 ENCOUNTER — Other Ambulatory Visit: Payer: Self-pay | Admitting: Physician Assistant

## 2018-05-12 DIAGNOSIS — I10 Essential (primary) hypertension: Secondary | ICD-10-CM

## 2018-06-19 ENCOUNTER — Other Ambulatory Visit: Payer: Self-pay | Admitting: Physician Assistant

## 2018-06-19 DIAGNOSIS — R7303 Prediabetes: Secondary | ICD-10-CM

## 2018-07-26 ENCOUNTER — Other Ambulatory Visit: Payer: Self-pay | Admitting: Physician Assistant

## 2018-07-26 DIAGNOSIS — E785 Hyperlipidemia, unspecified: Secondary | ICD-10-CM

## 2018-07-26 DIAGNOSIS — I2510 Atherosclerotic heart disease of native coronary artery without angina pectoris: Secondary | ICD-10-CM

## 2018-08-06 ENCOUNTER — Other Ambulatory Visit: Payer: Self-pay | Admitting: Physician Assistant

## 2018-08-06 DIAGNOSIS — I10 Essential (primary) hypertension: Secondary | ICD-10-CM

## 2018-08-11 ENCOUNTER — Other Ambulatory Visit: Payer: Self-pay | Admitting: Physician Assistant

## 2018-08-11 DIAGNOSIS — F4001 Agoraphobia with panic disorder: Secondary | ICD-10-CM

## 2018-08-22 ENCOUNTER — Encounter: Payer: Self-pay | Admitting: Physician Assistant

## 2018-08-22 ENCOUNTER — Ambulatory Visit (INDEPENDENT_AMBULATORY_CARE_PROVIDER_SITE_OTHER): Payer: BC Managed Care – PPO | Admitting: Physician Assistant

## 2018-08-22 VITALS — BP 135/88 | HR 72 | Wt 217.0 lb

## 2018-08-22 DIAGNOSIS — I10 Essential (primary) hypertension: Secondary | ICD-10-CM

## 2018-08-22 DIAGNOSIS — Z79899 Other long term (current) drug therapy: Secondary | ICD-10-CM

## 2018-08-22 DIAGNOSIS — F422 Mixed obsessional thoughts and acts: Secondary | ICD-10-CM

## 2018-08-22 DIAGNOSIS — E8881 Metabolic syndrome: Secondary | ICD-10-CM

## 2018-08-22 DIAGNOSIS — R7303 Prediabetes: Secondary | ICD-10-CM

## 2018-08-22 DIAGNOSIS — R740 Nonspecific elevation of levels of transaminase and lactic acid dehydrogenase [LDH]: Secondary | ICD-10-CM

## 2018-08-22 DIAGNOSIS — F39 Unspecified mood [affective] disorder: Secondary | ICD-10-CM

## 2018-08-22 DIAGNOSIS — R7401 Elevation of levels of liver transaminase levels: Secondary | ICD-10-CM

## 2018-08-22 DIAGNOSIS — I2510 Atherosclerotic heart disease of native coronary artery without angina pectoris: Secondary | ICD-10-CM

## 2018-08-22 DIAGNOSIS — F4001 Agoraphobia with panic disorder: Secondary | ICD-10-CM

## 2018-08-22 NOTE — Progress Notes (Signed)
Virtual Visit via Video Note  I connected with Jack [name] on 08/22/18 at  4:20 PM EDT by a video enabled telemedicine application and verified that I am speaking with the correct person using two identifiers.   I discussed the limitations of evaluation and management by telemedicine and the availability of in person appointments. The patient expressed understanding and agreed to proceed.  History of Present Illness: HPI:                                                                Jack [name] is a 48 y.o. male   CC: medication refills  HTN: taking Amlodipine 10 mg and Metoprolol XL 200 mg daily. Compliant with medications. Checks BP's at home. BP range 130's/80's. Reports prior to pandemic he had lost 17 lb using intermittent fasting, but has gained back around 20 lb. Denies vision change, headache, chest pain with exertion, orthopnea, lightheadedness, syncope and edema. Risk factors include: hx of CVA Hx of CVA: taking Atorvastatin 40 mg bid and aspirin 325 mg.   GAD/Agoraphobia: doing well on Clonazepam 1 mg bid. Reports he had a few panic attacks last week related to current events. Functioning well at current job.  Prediabetes/Metabolic syndrome: taking Metformin XR daily. He was having some GI upset, but found that taking probiotics and dosing the medication at night was helpful. He was found to have transaminitis at that time. Denies jaundice/icterus, pruritus, abdominal pain.    [facility] 08/22/2018 02/22/2018 07/16/2017 04/09/2017 11/03/2016  Decreased Interest 0 0 0 0 0  Down, Depressed, Hopeless 0 0 0 0 0  PHQ - 2 Score 0 0 0 0 0  Altered sleeping 0 0 0 1 -  Tired, decreased energy 0 0 0 0 -  Change in appetite 0 0 0 0 -  Feeling bad or failure about yourself  0 0 0 0 -  Trouble concentrating 0 0 0 0 -  Moving slowly or fidgety/restless 0 0 0 0 -  Suicidal thoughts 0 0 0 0 -  PHQ-9 Score 0 0 0 1 -    GAD 7 : Generalized Anxiety Score 08/22/2018  02/22/2018 07/16/2017 04/09/2017  Nervous, Anxious, on Edge 1 1 0 3  Control/stop worrying 1 1 0 3  Worry too much - different things 1 1 1 3   Trouble relaxing 0 0 0 3  Restless 0 0 0 2  Easily annoyed or irritable 0 0 1 2  Afraid - awful might happen 1 1 0 3  Total GAD 7 Score 4 4 2 19   Anxiety Difficulty - Somewhat difficult - -      Past Medical History:  Diagnosis Date  . Anxiety   . Gout   . High cholesterol   . Hypertension   . Stroke [facility](HCC)    Past Surgical History:  Procedure Laterality Date  . NO PAST SURGERIES     Social History   Tobacco Use  . Smoking status: Never Smoker  . Smokeless tobacco: Never Used  Substance Use Topics  . Alcohol use: No   family history is not on file. He was adopted.    ROS: negative except as noted in the HPI  Medications: Current Outpatient Medications  Medication Sig Dispense Refill  .  allopurinol (ZYLOPRIM) 300 MG tablet TAKE 1 TABLET BY MOUTH EVERY DAY 90 tablet 0  . amLODipine (NORVASC) 10 MG tablet TAKE 1 TABLET BY MOUTH EVERY DAY 90 tablet 1  . aspirin 325 MG tablet Take 1 tablet (325 mg total) by mouth daily.    Marland Kitchen. atorvastatin (LIPITOR) 40 MG tablet TAKE 2 TABLETS (80 MG TOTAL) BY MOUTH AT BEDTIME. 180 tablet 1  . buPROPion (WELLBUTRIN XL) 150 MG 24 hr tablet TAKE 1 TABLET BY MOUTH EVERY DAY 90 tablet 1  . clonazePAM (KLONOPIN) 1 MG tablet TAKE 1-1&1/2 TABLETS BY MOUTH TWICE A DAY **MUST LAST 30 DAYS** 70 tablet 2  . CO ENZYME Q-10 PO Take by mouth.    . metFORMIN (GLUCOPHAGE-XR) 500 MG 24 hr tablet TAKE 1 TABLET BY MOUTH EVERY DAY WITH BREAKFAST 90 tablet 0  . metoprolol (TOPROL-XL) 200 MG 24 hr tablet Take 1 tablet (200 mg total) by mouth daily. Due for follow up visit w/PCP 30 tablet 0  . Probiotic Product (PHILLIPS COLON HEALTH PO) Take by mouth.     No current facility-administered medications for this visit.    Allergies  Allergen Reactions  . Levaquin [Levofloxacin In D5w] Itching and Swelling  . Edarbi  [Azilsartan] Other (See Comments)    Depressed mood  . Lisinopril Cough  . Trazodone And Nefazodone Rash  . Valsartan Rash       Objective:  BP 135/88   Pulse 72   Wt 217 lb (98.4 kg)   BMI 31.14 kg/m  Gen:  alert, not ill-appearing, no distress, appropriate for age HEENT: head normocephalic without obvious abnormality, conjunctiva and cornea clear, trachea midline Pulm: Normal work of breathing, normal phonation Neuro: alert and oriented x 3 Psych: cooperative, euthymic mood, affect mood-congruent, speech is articulate, normal rate and volume; thought processes clear and goal-directed, normal judgment, good insight   BP Readings from Last 3 Encounters:  08/22/18 135/88  02/22/18 125/85  11/11/17 120/70   Wt Readings from Last 3 Encounters:  08/22/18 217 lb (98.4 kg)  02/22/18 224 lb (101.6 kg)  11/11/17 219 lb (99.3 kg)    Lab Results  Component Value Date   CREATININE 0.79 02/22/2018   BUN 13 02/22/2018   NA 140 02/22/2018   K 4.5 02/22/2018   CL 103 02/22/2018   CO2 25 02/22/2018   Lab Results  Component Value Date   HGBA1C 6.0 (H) 02/22/2018    Lab Results  Component Value Date   ALT 92 (H) 02/22/2018   AST 43 (H) 02/22/2018   ALKPHOS 67 07/02/2016   BILITOT 0.6 02/22/2018   Lab Results  Component Value Date   WBC 8.2 02/22/2018   HGB 14.8 02/22/2018   HCT 43.8 02/22/2018   MCV 88.8 02/22/2018   PLT 252 02/22/2018   Lab Results  Component Value Date   CHOL 165 02/22/2018   HDL 35 (L) 02/22/2018   LDLCALC 100 (H) 02/22/2018   TRIG 178 (H) 02/22/2018   CHOLHDL 4.7 02/22/2018     Assessment and Plan: 48 y.o. male with   .Jack [name] was seen today for medication management.  Diagnoses and all orders for this visit:  Encounter for long-term (current) use of medications  Metabolic syndrome X -     Hepatic function panel -     BASIC METABOLIC PANEL WITH GFR -     Hemoglobin A1c -     CBC -     Lipid Panel w/reflex Direct  LDL  Hypertension goal  BP (blood pressure) < 130/80 -     BASIC METABOLIC PANEL WITH GFR  Mood disorder (HCC)  Mixed obsessional thoughts and acts  Agoraphobia with panic attacks  ASCVD (arteriosclerotic cardiovascular disease) -     Lipid Panel w/reflex Direct LDL  Prediabetes -     Hemoglobin A1c  Transaminitis -     Hepatic function panel -     CBC -     Lipid Panel w/reflex Direct LDL    Mood disorder/Panic disorder PHQ9=0 GAD7=4 Well controlled Cont current medications  HTN, ASCVD BP in stage 1 hypertensive range at home Cont current medications Cont therapeutic lifestyle changes, including weight loss  Metabolic Syndrome Due for recheck LFT's and A1C, deferred today per patient due to COVID-19 pandemic. Cont Metformin. Option to increase dose up to 1g bid for additional weight loss/glycemic control if tolerated Cont intermittent fasting Recommended Noom app  Follow-up in office in 4 months for CPE w/fasting labs   Follow Up Instructions:    I discussed the assessment and treatment plan with the patient. The patient was provided an opportunity to ask questions and all were answered. The patient agreed with the plan and demonstrated an understanding of the instructions.   The patient was advised to call back or seek an in-person evaluation if the symptoms worsen or if the condition fails to improve as anticipated.  I provided 10 minutes of non-face-to-face time during this encounter.   Trixie Dredge, [place]

## 2018-08-24 ENCOUNTER — Ambulatory Visit: Payer: Self-pay | Admitting: Physician Assistant

## 2018-09-01 ENCOUNTER — Other Ambulatory Visit: Payer: Self-pay | Admitting: Physician Assistant

## 2018-09-01 DIAGNOSIS — I10 Essential (primary) hypertension: Secondary | ICD-10-CM

## 2018-09-20 ENCOUNTER — Other Ambulatory Visit: Payer: Self-pay | Admitting: Physician Assistant

## 2018-09-20 DIAGNOSIS — R7303 Prediabetes: Secondary | ICD-10-CM

## 2018-10-25 ENCOUNTER — Other Ambulatory Visit: Payer: Self-pay | Admitting: Physician Assistant

## 2018-10-25 DIAGNOSIS — F39 Unspecified mood [affective] disorder: Secondary | ICD-10-CM

## 2018-10-25 DIAGNOSIS — I10 Essential (primary) hypertension: Secondary | ICD-10-CM

## 2018-11-17 ENCOUNTER — Encounter: Payer: Self-pay | Admitting: Physician Assistant

## 2018-11-17 ENCOUNTER — Ambulatory Visit (INDEPENDENT_AMBULATORY_CARE_PROVIDER_SITE_OTHER): Payer: BC Managed Care – PPO | Admitting: Physician Assistant

## 2018-11-17 VITALS — BP 132/88 | HR 87 | Temp 97.8°F | Wt 225.0 lb

## 2018-11-17 DIAGNOSIS — T461X5A Adverse effect of calcium-channel blockers, initial encounter: Secondary | ICD-10-CM

## 2018-11-17 DIAGNOSIS — R600 Localized edema: Secondary | ICD-10-CM

## 2018-11-17 DIAGNOSIS — R7303 Prediabetes: Secondary | ICD-10-CM | POA: Diagnosis not present

## 2018-11-17 DIAGNOSIS — R609 Edema, unspecified: Secondary | ICD-10-CM | POA: Diagnosis not present

## 2018-11-17 DIAGNOSIS — R635 Abnormal weight gain: Secondary | ICD-10-CM | POA: Diagnosis not present

## 2018-11-17 DIAGNOSIS — R740 Nonspecific elevation of levels of transaminase and lactic acid dehydrogenase [LDH]: Secondary | ICD-10-CM

## 2018-11-17 DIAGNOSIS — R7401 Elevation of levels of liver transaminase levels: Secondary | ICD-10-CM

## 2018-11-17 DIAGNOSIS — I2510 Atherosclerotic heart disease of native coronary artery without angina pectoris: Secondary | ICD-10-CM

## 2018-11-17 DIAGNOSIS — I119 Hypertensive heart disease without heart failure: Secondary | ICD-10-CM | POA: Diagnosis not present

## 2018-11-17 DIAGNOSIS — I10 Essential (primary) hypertension: Secondary | ICD-10-CM

## 2018-11-17 DIAGNOSIS — E8881 Metabolic syndrome: Secondary | ICD-10-CM

## 2018-11-17 MED ORDER — HYDROCHLOROTHIAZIDE 12.5 MG PO TABS: 12.5000 mg | ORAL_TABLET | ORAL | 0 refills | Status: DC

## 2018-11-17 MED ORDER — AMLODIPINE BESYLATE 5 MG PO TABS: 5.0000 mg | ORAL_TABLET | Freq: Every day | ORAL | 0 refills | Status: DC

## 2018-11-17 NOTE — Progress Notes (Signed)
Virtual Visit via Video Note  I connected with Jack GongMichael T [name] on 11/17/18 at  2:20 PM EDT by a video enabled telemedicine application and verified that I am speaking with the correct person using two identifiers.   I discussed the limitations of evaluation and management by telemedicine and the availability of in person appointments. The patient expressed understanding and agreed to proceed.  History of Present Illness: HPI:                                                                Jack GongMichael T [name] is a 48 y.o. male   CC: "?medication reaction"  Currently takes Amlodipine 10 mg and Toprol XL 200 mg nightly. He has noted some mild swelling since being on the Amlodipine. However, states swelling in his ankles that is gradually worsening over the last few weeks and on several occasions ankles have "doubled" in size bilaterally. He reports this is causing a weird and uncomfortable sensation in his ankles. Swelling is symmetric and typically occurs around 3pm. He believes it starts when he has been sitting as his desk for prolonged periods. He does endorse gradual weight gain of approx 10 lb and states pants fit tighter. Denies calf tenderness, asymmetric swelling, redness/warmth. Denies PND, orthopnea, dyspnea. Denies hx of DVT No hx of CHF. Last echo 07/02/16, EF 65-70%, moderate to severe LVH, grade 1 diastolic dysfunction  Reports home BP has been in the mid 130's systolic and bottom number is always somewhere in the mid-80's  [facility] 11/17/2018 08/22/2018 02/22/2018 07/16/2017 04/09/2017  Decreased Interest 0 0 0 0 0  Down, Depressed, Hopeless 0 0 0 0 0  PHQ - 2 Score 0 0 0 0 0  Altered sleeping 0 0 0 0 1  Tired, decreased energy 0 0 0 0 0  Change in appetite 0 0 0 0 0  Feeling bad or failure about yourself  0 0 0 0 0  Trouble concentrating 0 0 0 0 0  Moving slowly or fidgety/restless 0 0 0 0 0  Suicidal thoughts 0 0 0 0 0  PHQ-9 Score 0 0 0 0 1    GAD 7 :  Generalized Anxiety Score 11/17/2018 08/22/2018 02/22/2018 07/16/2017  Nervous, Anxious, on Edge 1 1 1  0  Control/stop worrying 1 1 1  0  Worry too much - different things 1 1 1 1   Trouble relaxing 1 0 0 0  Restless 0 0 0 0  Easily annoyed or irritable 0 0 0 1  Afraid - awful might happen 1 1 1  0  Total GAD 7 Score 5 4 4 2   Anxiety Difficulty - - Somewhat difficult -      Past Medical History:  Diagnosis Date  . Anxiety   . Gout   . High cholesterol   . Hypertension   . Stroke [facility](HCC)    Past Surgical History:  Procedure Laterality Date  . NO PAST SURGERIES     Social History   Tobacco Use  . Smoking status: Never Smoker  . Smokeless tobacco: Never Used  Substance Use Topics  . Alcohol use: No   family history is not on file. He was adopted.    ROS: negative except as noted in the HPI  Medications: Current  Outpatient Medications  Medication Sig Dispense Refill  . allopurinol (ZYLOPRIM) 300 MG tablet TAKE 1 TABLET BY MOUTH EVERY DAY 90 tablet 0  . aspirin 325 MG tablet Take 1 tablet (325 mg total) by mouth daily.    Marland Kitchen atorvastatin (LIPITOR) 40 MG tablet TAKE 2 TABLETS (80 MG TOTAL) BY MOUTH AT BEDTIME. 180 tablet 1  . buPROPion (WELLBUTRIN XL) 150 MG 24 hr tablet TAKE 1 TABLET BY MOUTH EVERY DAY 90 tablet 1  . clonazePAM (KLONOPIN) 1 MG tablet TAKE 1-1&1/2 TABLETS BY MOUTH TWICE A DAY **MUST LAST 30 DAYS** 70 tablet 2  . metFORMIN (GLUCOPHAGE-XR) 500 MG 24 hr tablet TAKE 1 TABLET BY MOUTH EVERY DAY WITH BREAKFAST 90 tablet 0  . metoprolol (TOPROL-XL) 200 MG 24 hr tablet Take 1 tablet (200 mg total) by mouth daily. 90 tablet 0  . Probiotic Product (PHILLIPS COLON HEALTH PO) Take by mouth.    Marland Kitchen amLODipine (NORVASC) 5 MG tablet Take 1 tablet (5 mg total) by mouth at bedtime. 90 tablet 0  . hydrochlorothiazide (HYDRODIURIL) 12.5 MG tablet Take 1 tablet (12.5 mg total) by mouth every morning. 90 tablet 0   No current facility-administered medications for this visit.     Allergies  Allergen Reactions  . Levaquin [Levofloxacin In D5w] Itching and Swelling  . Edarbi [Azilsartan] Other (See Comments)    Depressed mood  . Lisinopril Cough  . Trazodone And Nefazodone Rash  . Valsartan Rash       Objective:  BP 132/88 Comment: home reading  Pulse 87   Temp 97.8 F (36.6 C) (Oral)   Wt 225 lb (102.1 kg)   BMI 32.28 kg/m   Wt Readings from Last 3 Encounters:  11/17/18 225 lb (102.1 kg)  08/22/18 217 lb (98.4 kg)  02/22/18 224 lb (101.6 kg)   Temp Readings from Last 3 Encounters:  11/17/18 97.8 F (36.6 C) (Oral)  07/04/16 98.4 F (36.9 C) (Oral)  04/26/15 97.4 F (36.3 C) (Oral)   BP Readings from Last 3 Encounters:  11/17/18 132/88  08/22/18 135/88  02/22/18 125/85   Pulse Readings from Last 3 Encounters:  11/17/18 87  08/22/18 72  02/22/18 72     Pulm: Normal work of breathing, normal phonation Neuro: alert and oriented x 3 Psych: cooperative, euthymic mood, affect mood-congruent, speech is articulate, normal rate and volume; thought processes clear and goal-directed, normal judgment, good insight  Lab Results  Component Value Date   CREATININE 0.79 02/22/2018   BUN 13 02/22/2018   NA 140 02/22/2018   K 4.5 02/22/2018   CL 103 02/22/2018   CO2 25 02/22/2018   Lab Results  Component Value Date   ALT 92 (H) 02/22/2018   AST 43 (H) 02/22/2018   ALKPHOS 67 07/02/2016   BILITOT 0.6 02/22/2018   Lab Results  Component Value Date   WBC 8.2 02/22/2018   HGB 14.8 02/22/2018   HCT 43.8 02/22/2018   MCV 88.8 02/22/2018   PLT 252 02/22/2018   Lab Results  Component Value Date   CHOL 165 02/22/2018   HDL 35 (L) 02/22/2018   LDLCALC 100 (H) 02/22/2018   TRIG 178 (H) 02/22/2018   CHOLHDL 4.7 02/22/2018   Lab Results  Component Value Date   HGBA1C 6.0 (H) 02/22/2018   No results found for: TSH No results found for: TSH, T3TOTAL, T4TOTAL, THYROIDAB  Assessment and Plan: 48 y.o. male with   .Toma was seen  today for medication management.  Diagnoses and  all orders for this visit:  Peripheral edema -     B Nat Peptide -     CBC  LVH (left ventricular hypertrophy) due to hypertensive disease, without heart failure  Abnormal weight gain -     TSH + free T4 -     CBC  Prediabetes -     Hemoglobin A1c  ASCVD (arteriosclerotic cardiovascular disease) -     B Nat Peptide -     Lipid Panel w/reflex Direct LDL  Hypertension goal BP (blood pressure) < 130/80 -     BASIC METABOLIC PANEL WITH GFR -     TSH + free T4 -     amLODipine (NORVASC) 5 MG tablet; Take 1 tablet (5 mg total) by mouth at bedtime. -     hydrochlorothiazide (HYDRODIURIL) 12.5 MG tablet; Take 1 tablet (12.5 mg total) by mouth every morning.  Transaminitis -     Hepatic function panel -     CBC  Metabolic syndrome X -     TSH + free T4 -     Hepatic function panel -     Hemoglobin A1c -     Lipid Panel w/reflex Direct LDL  Adverse effect of calcium-channel blocker, initial encounter    Physical exam limited by video visit today. No evidence of respiratory distress. Unable to assess extent of edema Symptoms sound more consistent with adverse reaction to CCB based on hx. He denies CHF symptoms, but he has a hx of moderate to severe LVH on prior echo from 2018 Recommend patient come in for labs asap and check BNP, TSH, renal function/liver function and A1C  Hx of adverse reactions to ACE/ARB Will reduce Amlodipine to 5 mg QHS and add thiazide QAM Cont Toprol XL 200 mg Home BP monitoring  Follow-up in 1 week or sooner pending lab results    Follow Up Instructions:    I discussed the assessment and treatment plan with the patient. The patient was provided an opportunity to ask questions and all were answered. The patient agreed with the plan and demonstrated an understanding of the instructions.   The patient was advised to call back or seek an in-person evaluation if the symptoms worsen or if the condition  fails to improve as anticipated.  I provided 15 minutes of non-face-to-face time during this encounter.   Trixie Dredge, [place]

## 2018-11-22 DIAGNOSIS — R635 Abnormal weight gain: Secondary | ICD-10-CM | POA: Diagnosis not present

## 2018-11-22 DIAGNOSIS — I10 Essential (primary) hypertension: Secondary | ICD-10-CM | POA: Diagnosis not present

## 2018-11-22 DIAGNOSIS — E785 Hyperlipidemia, unspecified: Secondary | ICD-10-CM | POA: Diagnosis not present

## 2018-11-22 DIAGNOSIS — I2510 Atherosclerotic heart disease of native coronary artery without angina pectoris: Secondary | ICD-10-CM | POA: Diagnosis not present

## 2018-11-23 LAB — CBC
HCT: 42.9 % (ref 38.5–50.0)
Hemoglobin: 14.5 g/dL (ref 13.2–17.1)
MCH: 30.9 pg (ref 27.0–33.0)
MCHC: 33.8 g/dL (ref 32.0–36.0)
MCV: 91.5 fL (ref 80.0–100.0)
MPV: 11.8 fL (ref 7.5–12.5)
Platelets: 224 10*3/uL (ref 140–400)
RBC: 4.69 10*6/uL (ref 4.20–5.80)
RDW: 12.8 % (ref 11.0–15.0)
WBC: 8.7 10*3/uL (ref 3.8–10.8)

## 2018-11-23 LAB — BASIC METABOLIC PANEL WITH GFR
BUN: 12 mg/dL (ref 7–25)
CO2: 22 mmol/L (ref 20–32)
Calcium: 9.5 mg/dL (ref 8.6–10.3)
Chloride: 106 mmol/L (ref 98–110)
Creat: 0.89 mg/dL (ref 0.60–1.35)
GFR, Est African American: 116 mL/min/{1.73_m2} (ref >=60)
GFR, Est Non African American: 100 mL/min/{1.73_m2} (ref >=60)
Glucose, Bld: 124 mg/dL — ABNORMAL HIGH (ref 65–99)
Potassium: 4.2 mmol/L (ref 3.5–5.3)
Sodium: 140 mmol/L (ref 135–146)

## 2018-11-23 LAB — LIPID PANEL W/REFLEX DIRECT LDL
Cholesterol: 138 mg/dL (ref <200)
HDL: 32 mg/dL — ABNORMAL LOW (ref >=40)
LDL Cholesterol (Calc): 76 mg/dL (calc)
Non-HDL Cholesterol (Calc): 106 mg/dL (calc) (ref <130)
Total CHOL/HDL Ratio: 4.3 (calc) (ref <5.0)
Triglycerides: 201 mg/dL — ABNORMAL HIGH (ref <150)

## 2018-11-23 LAB — HEPATIC FUNCTION PANEL
AG Ratio: 1.7 (calc) (ref 1.0–2.5)
ALT: 53 U/L — ABNORMAL HIGH (ref 9–46)
AST: 32 U/L (ref 10–40)
Albumin: 4.3 g/dL (ref 3.6–5.1)
Alkaline phosphatase (APISO): 65 U/L (ref 36–130)
Bilirubin, Direct: 0.1 mg/dL (ref 0.0–0.2)
Globulin: 2.5 g/dL (calc) (ref 1.9–3.7)
Indirect Bilirubin: 0.3 mg/dL (calc) (ref 0.2–1.2)
Total Bilirubin: 0.4 mg/dL (ref 0.2–1.2)
Total Protein: 6.8 g/dL (ref 6.1–8.1)

## 2018-11-23 LAB — HEMOGLOBIN A1C
Hgb A1c MFr Bld: 6.2 % of total Hgb — ABNORMAL HIGH (ref <5.7)
Mean Plasma Glucose: 131 (calc)
eAG (mmol/L): 7.3 (calc)

## 2018-11-23 LAB — TSH+FREE T4: TSH W/REFLEX TO FT4: 2.55 mIU/L (ref 0.40–4.50)

## 2018-11-23 LAB — BRAIN NATRIURETIC PEPTIDE: Brain Natriuretic Peptide: 4 pg/mL (ref <100)

## 2018-11-24 ENCOUNTER — Encounter: Payer: Self-pay | Admitting: Physician Assistant

## 2018-11-24 ENCOUNTER — Ambulatory Visit (INDEPENDENT_AMBULATORY_CARE_PROVIDER_SITE_OTHER): Payer: BC Managed Care – PPO | Admitting: Physician Assistant

## 2018-11-24 VITALS — BP 134/82 | HR 73 | Temp 98.7°F | Wt 222.0 lb

## 2018-11-24 DIAGNOSIS — I10 Essential (primary) hypertension: Secondary | ICD-10-CM

## 2018-11-24 DIAGNOSIS — R609 Edema, unspecified: Secondary | ICD-10-CM

## 2018-11-24 NOTE — Progress Notes (Signed)
Virtual Visit via Telephone Note  I connected with Jack [name] on 12/14/18 at  2:40 PM EDT by telephone and verified that I am speaking with the correct person using two identifiers.   I discussed the limitations, risks, security and privacy concerns of performing an evaluation and management service by telephone and the availability of in person appointments. I also discussed with the patient that there may be a patient responsible charge related to this service. The patient expressed understanding and agreed to proceed.   History of Present Illness: HPI:                                                                Jack [name] is a 48 y.o. male   CC: edema follow-up  He states since switching to HCTZ 12.5 mg and reducing Amlodipine 5 mg he has noticed a huge improvement in his swelling. He has actually noticed reduced swelling in his hands as well. Weight is down 3 lb.  Past Medical History:  Diagnosis Date  . Anxiety   . Gout   . High cholesterol   . Hypertension   . Stroke [facility])    Past Surgical History:  Procedure Laterality Date  . NO PAST SURGERIES     Social History   Tobacco Use  . Smoking status: Never Smoker  . Smokeless tobacco: Never Used  Substance Use Topics  . Alcohol use: No   family history is not on file. He was adopted.    ROS: negative except as noted in the HPI  Medications: Current Outpatient Medications  Medication Sig Dispense Refill  . allopurinol (ZYLOPRIM) 300 MG tablet TAKE 1 TABLET BY MOUTH EVERY DAY 90 tablet 0  . amLODipine (NORVASC) 5 MG tablet Take 1 tablet (5 mg total) by mouth at bedtime. 90 tablet 0  . aspirin 325 MG tablet Take 1 tablet (325 mg total) by mouth daily.    Marland Kitchen atorvastatin (LIPITOR) 40 MG tablet TAKE 2 TABLETS (80 MG TOTAL) BY MOUTH AT BEDTIME. 180 tablet 1  . buPROPion (WELLBUTRIN XL) 150 MG 24 hr tablet TAKE 1 TABLET BY MOUTH EVERY DAY 90 tablet 1  . hydrochlorothiazide (HYDRODIURIL) 12.5 MG tablet Take  1 tablet (12.5 mg total) by mouth every morning. 90 tablet 0  . Probiotic Product ([place]) Take by mouth.    . clonazePAM (KLONOPIN) 1 MG tablet Take 1-1.5 tablets (1-1.5 mg total) by mouth 2 (two) times daily. 70 tablet 2  . metFORMIN (GLUCOPHAGE-XR) 500 MG 24 hr tablet TAKE 1 TABLET BY MOUTH EVERY DAY WITH BREAKFAST 90 tablet 0  . metoprolol (TOPROL-XL) 200 MG 24 hr tablet TAKE 1 TABLET BY MOUTH EVERY DAY 90 tablet 1   No current facility-administered medications for this visit.    Allergies  Allergen Reactions  . Levaquin [Levofloxacin In D5w] Itching and Swelling  . Edarbi [Azilsartan] Other (See Comments)    Depressed mood  . Lisinopril Cough  . Trazodone And Nefazodone Rash  . Valsartan Rash       Objective:  BP 134/82   Pulse 73   Temp 98.7 F (37.1 C) (Tympanic)   Wt 222 lb (100.7 kg)   BMI 31.85 kg/m   Wt Readings from Last 3 Encounters:  11/24/18  222 lb (100.7 kg)  11/17/18 225 lb (102.1 kg)  08/22/18 217 lb (98.4 kg)   Temp Readings from Last 3 Encounters:  11/24/18 98.7 F (37.1 C) (Tympanic)  11/17/18 97.8 F (36.6 C) (Oral)  07/04/16 98.4 F (36.9 C) (Oral)   BP Readings from Last 3 Encounters:  11/24/18 134/82  11/17/18 132/88  08/22/18 135/88   Pulse Readings from Last 3 Encounters:  11/24/18 73  11/17/18 87  08/22/18 72     Pulm: Normal work of breathing, normal phonation Neuro: alert and oriented x 3 Psych: cooperative, euthymic mood, affect mood-congruent, speech is articulate, normal rate and volume; thought processes clear and goal-directed, normal judgment, good insight    No results found for this or any previous visit (from the past 72 hour(s)). No results found.    Assessment and Plan: 48 y.o. male with   .Jack [name] was seen today for follow-up and edema.  Diagnoses and all orders for this visit:  Peripheral edema  Hypertension goal BP (blood pressure) < 130/80   Personally reviewed labs 11/22/18 BNP  wnl, low clinical suspicion for CHF. No orthopnea, PND, change in exercise tolerance Improvement in edema with reduction of CCB and addition of low-dose thiazide Cont Amlodipine 5 mg and HCTZ 12.5 mg    Follow Up Instructions:    I discussed the assessment and treatment plan with the patient. The patient was provided an opportunity to ask questions and all were answered. The patient agreed with the plan and demonstrated an understanding of the instructions.   The patient was advised to call back or seek an in-person evaluation if the symptoms worsen or if the condition fails to improve as anticipated.  I provided 5-10 minutes of non-face-to-face time during this encounter.   Carlis Stableharley Elizabeth Laiah Pouncey, [place]

## 2018-11-28 ENCOUNTER — Other Ambulatory Visit: Payer: Self-pay | Admitting: Physician Assistant

## 2018-11-28 DIAGNOSIS — I10 Essential (primary) hypertension: Secondary | ICD-10-CM

## 2018-11-30 ENCOUNTER — Other Ambulatory Visit: Payer: Self-pay | Admitting: Physician Assistant

## 2018-11-30 DIAGNOSIS — F4001 Agoraphobia with panic disorder: Secondary | ICD-10-CM

## 2018-12-14 ENCOUNTER — Encounter: Payer: Self-pay | Admitting: Physician Assistant

## 2018-12-14 ENCOUNTER — Other Ambulatory Visit: Payer: Self-pay | Admitting: Physician Assistant

## 2018-12-14 DIAGNOSIS — R7303 Prediabetes: Secondary | ICD-10-CM

## 2018-12-27 ENCOUNTER — Encounter: Payer: BC Managed Care – PPO | Admitting: Physician Assistant

## 2019-01-16 ENCOUNTER — Other Ambulatory Visit: Payer: Self-pay | Admitting: Physician Assistant

## 2019-01-16 DIAGNOSIS — I2510 Atherosclerotic heart disease of native coronary artery without angina pectoris: Secondary | ICD-10-CM

## 2019-01-16 DIAGNOSIS — E785 Hyperlipidemia, unspecified: Secondary | ICD-10-CM

## 2019-02-07 ENCOUNTER — Other Ambulatory Visit: Payer: Self-pay | Admitting: Physician Assistant

## 2019-02-07 DIAGNOSIS — I10 Essential (primary) hypertension: Secondary | ICD-10-CM

## 2019-02-22 ENCOUNTER — Encounter: Payer: Self-pay | Admitting: Osteopathic Medicine

## 2019-02-22 ENCOUNTER — Ambulatory Visit (INDEPENDENT_AMBULATORY_CARE_PROVIDER_SITE_OTHER): Payer: BC Managed Care – PPO | Admitting: Osteopathic Medicine

## 2019-02-22 VITALS — BP 129/84 | Temp 98.5°F | Wt 227.0 lb

## 2019-02-22 DIAGNOSIS — I2510 Atherosclerotic heart disease of native coronary artery without angina pectoris: Secondary | ICD-10-CM | POA: Diagnosis not present

## 2019-02-22 DIAGNOSIS — R7303 Prediabetes: Secondary | ICD-10-CM | POA: Diagnosis not present

## 2019-02-22 DIAGNOSIS — I10 Essential (primary) hypertension: Secondary | ICD-10-CM | POA: Diagnosis not present

## 2019-02-22 DIAGNOSIS — F39 Unspecified mood [affective] disorder: Secondary | ICD-10-CM | POA: Diagnosis not present

## 2019-02-22 DIAGNOSIS — M1A9XX0 Chronic gout, unspecified, without tophus (tophi): Secondary | ICD-10-CM

## 2019-02-22 DIAGNOSIS — E785 Hyperlipidemia, unspecified: Secondary | ICD-10-CM

## 2019-02-23 ENCOUNTER — Encounter: Payer: Self-pay | Admitting: Osteopathic Medicine

## 2019-02-23 ENCOUNTER — Telehealth: Payer: Self-pay

## 2019-02-23 MED ORDER — ATORVASTATIN CALCIUM 40 MG PO TABS: 80.0000 mg | ORAL_TABLET | Freq: Every day | ORAL | 1 refills | Status: DC

## 2019-02-23 MED ORDER — METFORMIN HCL ER 500 MG PO TB24: ORAL_TABLET | ORAL | 3 refills | Status: DC

## 2019-02-23 MED ORDER — AMLODIPINE BESYLATE 5 MG PO TABS: ORAL_TABLET | ORAL | 3 refills | Status: DC

## 2019-02-23 MED ORDER — BUPROPION HCL ER (XL) 150 MG PO TB24: 150.0000 mg | ORAL_TABLET | Freq: Every day | ORAL | 3 refills | Status: DC

## 2019-02-23 MED ORDER — HYDROCHLOROTHIAZIDE 12.5 MG PO TABS: 12.5000 mg | ORAL_TABLET | Freq: Every morning | ORAL | 3 refills | Status: DC

## 2019-02-23 MED ORDER — METOPROLOL SUCCINATE ER 200 MG PO TB24: 200.0000 mg | ORAL_TABLET | Freq: Every day | ORAL | 3 refills | Status: DC

## 2019-02-23 MED ORDER — ALLOPURINOL 300 MG PO TABS: 300.0000 mg | ORAL_TABLET | Freq: Every day | ORAL | 3 refills | Status: DC

## 2019-02-23 NOTE — Patient Instructions (Signed)
I've sent refills on everything except the Clonazepam -please contact your pharmacy when you are due for refills on this medication, they should have a couple refills on file.  Any questions, let us know!  Since recent labs were okay, I think we would be fine to just recheck the A1c in another 3 months from now, and recheck labs in general 6 months from now/get annual physical done around that time.  I have set a MyChart reminder to alert you when you are due to come in for the A1c.

## 2019-02-23 NOTE — Telephone Encounter (Signed)
Responded via MyChart.

## 2019-02-23 NOTE — Progress Notes (Signed)
Virtual Visit via Video (App used: Doximity) Note  I connected with      Jack [name] on 02/22/19 at 11:58 AM  by a telemedicine application and verified that I am speaking with the correct person using two identifiers.  Patient is at home I am in office   I discussed the limitations of evaluation and management by telemedicine and the availability of in person appointments. The patient expressed understanding and agreed to proceed.  History of Present Illness: Jack [name] is a 48 y.o. male who would like to discuss follow-up for medication refills, anxiety   Patient reports he is doing well on current medications, no major complaints or concerns today.  Recent labs done about 3 months ago, A1c stable in prediabetic range, liver enzymes improved from previous check  Depression screen [facility] 2/9 02/22/2019 11/17/2018 08/22/2018  Decreased Interest 0 0 0  Down, Depressed, Hopeless 0 0 0  PHQ - 2 Score 0 0 0  Altered sleeping 0 0 0  Tired, decreased energy 0 0 0  Change in appetite 0 0 0  Feeling bad or failure about yourself  0 0 0  Trouble concentrating 0 0 0  Moving slowly or fidgety/restless 0 0 0  Suicidal thoughts 0 0 0  PHQ-9 Score 0 0 0   GAD 7 : Generalized Anxiety Score 02/22/2019 11/17/2018 08/22/2018 02/22/2018  Nervous, Anxious, on Edge 1 1 1 1   Control/stop worrying 0 1 1 1   Worry too much - different things 1 1 1 1   Trouble relaxing 0 1 0 0  Restless 0 0 0 0  Easily annoyed or irritable 0 0 0 0  Afraid - awful might happen 1 1 1 1   Total GAD 7 Score 3 5 4 4   Anxiety Difficulty Not difficult at all - - Somewhat difficult         Observations/Objective: BP 129/84   Temp 98.5 F (36.9 C) (Oral)   Wt 227 lb (103 kg)   BMI 32.57 kg/m  BP Readings from Last 3 Encounters:  02/22/19 129/84  11/24/18 134/82  11/17/18 132/88   Exam: Normal Speech.  NAD  Lab and Radiology Results No results found for this or any previous visit (from the past 72  hour(s)). No results found.     Assessment and Plan: 48 y.o. male with The primary encounter diagnosis was Prediabetes. Diagnoses of Hypertension goal BP (blood pressure) < 130/80, Mood disorder ([place]), ASCVD (arteriosclerotic cardiovascular disease), Hyperlipidemia LDL goal <70, and Chronic gout without tophus, unspecified cause, unspecified site were also pertinent to this visit.   PDMP not reviewed this encounter. Orders Placed This Encounter  Procedures  . A1C   Meds ordered this encounter  Medications  . allopurinol (ZYLOPRIM) 300 MG tablet    Sig: Take 1 tablet (300 mg total) by mouth daily.    Dispense:  90 tablet    Refill:  3  . amLODipine (NORVASC) 5 MG tablet    Sig: TAKE 1 TABLET BY MOUTH EVERYDAY AT BEDTIME    Dispense:  90 tablet    Refill:  3  . metoprolol (TOPROL-XL) 200 MG 24 hr tablet    Sig: Take 1 tablet (200 mg total) by mouth daily.    Dispense:  90 tablet    Refill:  3  . metFORMIN (GLUCOPHAGE-XR) 500 MG 24 hr tablet    Sig: TAKE 1 TABLET BY MOUTH EVERY DAY WITH BREAKFAST    Dispense:  90 tablet  Refill:  3  . hydrochlorothiazide (HYDRODIURIL) 12.5 MG tablet    Sig: Take 1 tablet (12.5 mg total) by mouth every morning.    Dispense:  90 tablet    Refill:  3  . buPROPion (WELLBUTRIN XL) 150 MG 24 hr tablet    Sig: Take 1 tablet (150 mg total) by mouth daily.    Dispense:  90 tablet    Refill:  3  . atorvastatin (LIPITOR) 40 MG tablet    Sig: Take 2 tablets (80 mg total) by mouth at bedtime.    Dispense:  180 tablet    Refill:  1   Patient Instructions  I've sent refills on everything except the Clonazepam -please contact your pharmacy when you are due for refills on this medication, they should have a couple refills on file.  Any questions, let us know!  Since recent labs were okay, I think we would be fine to just recheck the A1c in another 3 months from now, and recheck labs in general 6 months from now/get annual physical done around that  time.  I have set a MyChart reminder to alert you when you are due to come in for the A1c.     Instructions sent via MyChart. If MyChart not available, pt was given option for info via personal e-mail w/ no guarantee of protected health info over unsecured e-mail communication, and MyChart sign-up instructions were sent to patient.   Follow Up Instructions: Return in about 3 months (around 05/23/2019) for lab visit only for A1C, follow up in 6 mos w/ Dr A for annual physical / see [place] sooner if needed! .    I discussed the assessment and treatment plan with the patient. The patient was provided an opportunity to ask questions and all were answered. The patient agreed with the plan and demonstrated an understanding of the instructions.   The patient was advised to call back or seek an in-person evaluation if any new concerns, if symptoms worsen or if the condition fails to improve as anticipated.  25 minutes of non-face-to-face time was provided during this encounter.      . . . . . . . . . . . . . Marland Kitchen                   Historical information moved to improve visibility of documentation.  Past Medical History:  Diagnosis Date  . Anxiety   . Gout   . High cholesterol   . Hypertension   . Stroke [facility])    Past Surgical History:  Procedure Laterality Date  . NO PAST SURGERIES     Social History   Tobacco Use  . Smoking status: Never Smoker  . Smokeless tobacco: Never Used  Substance Use Topics  . Alcohol use: No   family history is not on file. He was adopted.  Medications: Current Outpatient Medications  Medication Sig Dispense Refill  . allopurinol (ZYLOPRIM) 300 MG tablet TAKE 1 TABLET BY MOUTH EVERY DAY 90 tablet 0  . amLODipine (NORVASC) 5 MG tablet TAKE 1 TABLET BY MOUTH EVERYDAY AT BEDTIME 90 tablet 0  . aspirin 325 MG tablet Take 1 tablet (325 mg total) by mouth daily.    Marland Kitchen atorvastatin (LIPITOR) 40 MG tablet TAKE 2 TABLETS (80 MG TOTAL)  BY MOUTH AT BEDTIME. 180 tablet 1  . buPROPion (WELLBUTRIN XL) 150 MG 24 hr tablet TAKE 1 TABLET BY MOUTH EVERY DAY 90 tablet 1  . clonazePAM (KLONOPIN) 1  MG tablet Take 1-1.5 tablets (1-1.5 mg total) by mouth 2 (two) times daily. 70 tablet 2  . hydrochlorothiazide (HYDRODIURIL) 12.5 MG tablet TAKE 1 TABLET BY MOUTH EVERY MORNING 90 tablet 0  . metFORMIN (GLUCOPHAGE-XR) 500 MG 24 hr tablet TAKE 1 TABLET BY MOUTH EVERY DAY WITH BREAKFAST 90 tablet 0  . metoprolol (TOPROL-XL) 200 MG 24 hr tablet TAKE 1 TABLET BY MOUTH EVERY DAY 90 tablet 1  . Probiotic Product (PHILLIPS COLON HEALTH PO) Take by mouth.     No current facility-administered medications for this visit.   Allergies  Allergen Reactions  . Levaquin [Levofloxacin In D5w] Itching and Swelling  . Edarbi [Azilsartan] Other (See Comments)    Depressed mood  . Lisinopril Cough  . Trazodone And Nefazodone Rash  . Valsartan Rash

## 2019-02-23 NOTE — Telephone Encounter (Signed)
Pt left a vm msg stating he did not remember whether or not you wanted him to complete the lab order today. Or when provider recommends next follow up appt. Requesting feed back from provider. He apologizes for not taking notes during his virtual visit yesterday. Pls advise, thanks.

## 2019-02-24 NOTE — Telephone Encounter (Signed)
Noted  

## 2019-03-14 ENCOUNTER — Other Ambulatory Visit: Payer: Self-pay | Admitting: Physician Assistant

## 2019-03-14 ENCOUNTER — Encounter: Payer: Self-pay | Admitting: Osteopathic Medicine

## 2019-03-14 DIAGNOSIS — F4001 Agoraphobia with panic disorder: Secondary | ICD-10-CM

## 2019-03-14 NOTE — Telephone Encounter (Signed)
Last RX sent 12/01/18 for 30 day supply with 2 refills  RX pended

## 2019-03-15 ENCOUNTER — Encounter: Payer: Self-pay | Admitting: Osteopathic Medicine

## 2019-03-15 MED ORDER — CLONAZEPAM 1 MG PO TABS: 1.0000 mg | ORAL_TABLET | Freq: Two times a day (BID) | ORAL | 2 refills | Status: DC

## 2019-05-10 DIAGNOSIS — R6883 Chills (without fever): Secondary | ICD-10-CM | POA: Diagnosis not present

## 2019-05-10 DIAGNOSIS — R438 Other disturbances of smell and taste: Secondary | ICD-10-CM | POA: Diagnosis not present

## 2019-05-10 DIAGNOSIS — Z20828 Contact with and (suspected) exposure to other viral communicable diseases: Secondary | ICD-10-CM | POA: Diagnosis not present

## 2019-05-10 DIAGNOSIS — K591 Functional diarrhea: Secondary | ICD-10-CM | POA: Diagnosis not present

## 2019-06-14 DIAGNOSIS — I10 Essential (primary) hypertension: Secondary | ICD-10-CM | POA: Diagnosis not present

## 2019-06-14 DIAGNOSIS — R7303 Prediabetes: Secondary | ICD-10-CM | POA: Diagnosis not present

## 2019-06-14 DIAGNOSIS — I2510 Atherosclerotic heart disease of native coronary artery without angina pectoris: Secondary | ICD-10-CM | POA: Diagnosis not present

## 2019-06-14 DIAGNOSIS — F39 Unspecified mood [affective] disorder: Secondary | ICD-10-CM | POA: Diagnosis not present

## 2019-06-15 ENCOUNTER — Other Ambulatory Visit: Payer: Self-pay | Admitting: Osteopathic Medicine

## 2019-06-15 DIAGNOSIS — R7303 Prediabetes: Secondary | ICD-10-CM

## 2019-06-15 LAB — HEMOGLOBIN A1C
Hgb A1c MFr Bld: 7.2 % of total Hgb — ABNORMAL HIGH (ref <5.7)
Mean Plasma Glucose: 160 (calc)
eAG (mmol/L): 8.9 (calc)

## 2019-06-15 MED ORDER — METFORMIN HCL ER 500 MG PO TB24: 1000.0000 mg | ORAL_TABLET | Freq: Every day | ORAL | 3 refills | Status: DC

## 2019-06-28 ENCOUNTER — Other Ambulatory Visit: Payer: Self-pay | Admitting: Osteopathic Medicine

## 2019-06-28 DIAGNOSIS — F4001 Agoraphobia with panic disorder: Secondary | ICD-10-CM

## 2019-06-29 ENCOUNTER — Encounter: Payer: Self-pay | Admitting: Osteopathic Medicine

## 2019-06-29 DIAGNOSIS — F4001 Agoraphobia with panic disorder: Secondary | ICD-10-CM

## 2019-06-29 NOTE — Telephone Encounter (Signed)
Routing to provider. Last rx on 03/15/19. Rx pended.

## 2019-06-30 ENCOUNTER — Encounter: Payer: Self-pay | Admitting: Osteopathic Medicine

## 2019-06-30 MED ORDER — CLONAZEPAM 1 MG PO TABS: 1.0000 mg | ORAL_TABLET | Freq: Two times a day (BID) | ORAL | 2 refills | Status: DC

## 2019-10-06 ENCOUNTER — Other Ambulatory Visit: Payer: Self-pay | Admitting: Osteopathic Medicine

## 2019-10-06 DIAGNOSIS — I2510 Atherosclerotic heart disease of native coronary artery without angina pectoris: Secondary | ICD-10-CM

## 2019-10-06 DIAGNOSIS — E785 Hyperlipidemia, unspecified: Secondary | ICD-10-CM

## 2019-10-10 ENCOUNTER — Other Ambulatory Visit: Payer: Self-pay | Admitting: Osteopathic Medicine

## 2019-10-10 DIAGNOSIS — F4001 Agoraphobia with panic disorder: Secondary | ICD-10-CM

## 2020-01-23 ENCOUNTER — Other Ambulatory Visit: Payer: Self-pay | Admitting: Osteopathic Medicine

## 2020-01-23 DIAGNOSIS — F4001 Agoraphobia with panic disorder: Secondary | ICD-10-CM

## 2020-03-06 DIAGNOSIS — Z20822 Contact with and (suspected) exposure to covid-19: Secondary | ICD-10-CM | POA: Diagnosis not present

## 2020-03-31 ENCOUNTER — Other Ambulatory Visit: Payer: Self-pay | Admitting: Osteopathic Medicine

## 2020-03-31 DIAGNOSIS — I2510 Atherosclerotic heart disease of native coronary artery without angina pectoris: Secondary | ICD-10-CM

## 2020-03-31 DIAGNOSIS — E785 Hyperlipidemia, unspecified: Secondary | ICD-10-CM

## 2020-04-03 ENCOUNTER — Ambulatory Visit (INDEPENDENT_AMBULATORY_CARE_PROVIDER_SITE_OTHER): Payer: BC Managed Care – PPO | Admitting: Osteopathic Medicine

## 2020-04-03 ENCOUNTER — Encounter: Payer: Self-pay | Admitting: Osteopathic Medicine

## 2020-04-03 ENCOUNTER — Other Ambulatory Visit: Payer: Self-pay

## 2020-04-03 VITALS — BP 137/78 | HR 68 | Temp 98.0°F | Ht 69.69 in | Wt 216.2 lb

## 2020-04-03 DIAGNOSIS — M1A9XX0 Chronic gout, unspecified, without tophus (tophi): Secondary | ICD-10-CM | POA: Diagnosis not present

## 2020-04-03 DIAGNOSIS — I10 Essential (primary) hypertension: Secondary | ICD-10-CM

## 2020-04-03 DIAGNOSIS — E785 Hyperlipidemia, unspecified: Secondary | ICD-10-CM | POA: Diagnosis not present

## 2020-04-03 DIAGNOSIS — R7303 Prediabetes: Secondary | ICD-10-CM | POA: Diagnosis not present

## 2020-04-03 DIAGNOSIS — Z0000 Encounter for general adult medical examination without abnormal findings: Secondary | ICD-10-CM

## 2020-04-03 LAB — POCT GLYCOSYLATED HEMOGLOBIN (HGB A1C): Hemoglobin A1C: 6.4 % — AB (ref 4.0–5.6)

## 2020-04-03 NOTE — Progress Notes (Signed)
HPI: Jack [name] is a 50 y.o. male who  has a past medical history of Anxiety, Gout, High cholesterol, Hypertension, and Stroke (HCC).  he presents to [facility] today, 04/03/20,  for chief complaint of:  Annual physical  Follow-up prediabetes, HTN, mental health   Doing well, no significant concerns today.  Due for follow-up on labs.     ASSESSMENT/PLAN: The primary encounter diagnosis was Annual physical exam. Diagnoses of Prediabetes, Hyperlipidemia LDL goal <70, Hypertension goal BP (blood pressure) < 130/80, and Chronic gout without tophus, unspecified cause, unspecified site were also pertinent to this visit.   Glucose, lipids are stable  Slightly elevated ALT compared to previous measurements, we will plan to recheck this when we have them back for A1c in 4-6 months    Orders Placed This Encounter  Procedures   CBC   COMPLETE METABOLIC PANEL WITH GFR   Lipid panel   Uric acid   POCT HgB A1C    Recent Results (from the past 2160 hour(s))  CBC     Status: None   Collection Time: 04/03/20 12:00 AM  Result Value Ref Range   WBC 7.4 3.8 - 10.8 Thousand/uL   RBC 5.10 4.20 - 5.80 Million/uL   Hemoglobin 15.8 13.2 - 17.1 g/dL   HCT 41.7 40.8 - 14.4 %   MCV 89.8 80.0 - 100.0 fL   MCH 31.0 27.0 - 33.0 pg   MCHC 34.5 32.0 - 36.0 g/dL   RDW 81.8 56.3 - 14.9 %   Platelets 212 140 - 400 Thousand/uL   MPV 11.7 7.5 - 12.5 fL  COMPLETE METABOLIC PANEL WITH GFR     Status: Abnormal   Collection Time: 04/03/20 12:00 AM  Result Value Ref Range   Glucose, Bld 107 (H) 65 - 99 mg/dL    Comment: .            Fasting reference interval . For someone without known diabetes, a glucose value between 100 and 125 mg/dL is consistent with prediabetes and should be confirmed with a follow-up test. .    BUN 11 7 - 25 mg/dL   Creat 7.02 6.37 - 8.58 mg/dL    Comment: For patients >35 years of age, the reference limit for Creatinine is  approximately 13% higher for people identified as African-American. .    GFR, Est Non African American 102 > OR = 60 mL/min/1.92m2   GFR, Est African American 118 > OR = 60 mL/min/1.34m2   BUN/Creatinine Ratio NOT APPLICABLE 6 - 22 (calc)   Sodium 138 135 - 146 mmol/L   Potassium 4.3 3.5 - 5.3 mmol/L   Chloride 100 98 - 110 mmol/L   CO2 27 20 - 32 mmol/L   Calcium 9.8 8.6 - 10.3 mg/dL   Total Protein 7.3 6.1 - 8.1 g/dL   Albumin 4.6 3.6 - 5.1 g/dL   Globulin 2.7 1.9 - 3.7 g/dL (calc)   AG Ratio 1.7 1.0 - 2.5 (calc)   Total Bilirubin 0.7 0.2 - 1.2 mg/dL   Alkaline phosphatase (APISO) 59 35 - 144 U/L   AST 33 10 - 35 U/L   ALT 53 (H) 9 - 46 U/L  Lipid panel     Status: Abnormal   Collection Time: 04/03/20 12:00 AM  Result Value Ref Range   Cholesterol 128 <200 mg/dL   HDL 35 (L) > OR = 40 mg/dL   Triglycerides 89 <850 mg/dL   LDL Cholesterol (Calc) 76 mg/dL (calc)  Comment: Reference range: <100 . Desirable range <100 mg/dL for primary prevention;   <70 mg/dL for patients with CHD or diabetic patients  with > or = 2 CHD risk factors. Marland Kitchen LDL-C is now calculated using the Martin-Hopkins  calculation, which is a validated novel method providing  better accuracy than the Friedewald equation in the  estimation of LDL-C.  Horald Pollen et al. Lenox Ahr. 9735;329(92): 2061-2068  (http://education.[facility].com/faq/FAQ164)    Total CHOL/HDL Ratio 3.7 <5.0 (calc)   Non-HDL Cholesterol (Calc) 93 <426 mg/dL (calc)    Comment: For patients with diabetes plus 1 major ASCVD risk  factor, treating to a non-HDL-C goal of <100 mg/dL  (LDL-C of <83 mg/dL) is considered a therapeutic  option.   Uric acid     Status: None   Collection Time: 04/03/20 12:00 AM  Result Value Ref Range   Uric Acid, Serum 5.5 4.0 - 8.0 mg/dL    Comment: Therapeutic target for gout patients: <6.0 mg/dL .   POCT HgB A1C     Status: Abnormal   Collection Time: 04/03/20  2:47 PM  Result Value Ref Range    Hemoglobin A1C 6.4 (A) 4.0 - 5.6 %   HbA1c POC (<> result, manual entry)     HbA1c, POC (prediabetic range)     HbA1c, POC (controlled diabetic range)      No orders of the defined types were placed in this encounter.   Patient Instructions  General Preventive Care  Most recent routine screening labs: ordered.   Blood pressure goal 130/80 or less.   Tobacco: don't!   Alcohol: responsible moderation is ok for most adults - if you have concerns about your alcohol intake, please talk to me!   Exercise: as tolerated to reduce risk of cardiovascular disease and diabetes. Strength training will also prevent osteoporosis.   Mental health: if need for mental health care (medicines, counseling, other), or concerns about moods, please let me know!   Sexual / Reproductive health: if need for STD testing, or if concerns with libido/pain problems, please let me know!   Advanced Directive: Living Will and/or Healthcare Power of Attorney recommended for all adults, regardless of age or health.  Vaccines  Flu vaccine: recommended every fall/winter   Shingles vaccine: after age 3.   Pneumonia vaccines: after age 46.Marland Kitchen  Tetanus booster: every 10 years - due 2028  COVID vaccine: THANKS for getting your vaccine! :)  Cancer screenings   Colon cancer screening: for everyone age 11-75. Colonoscopy available for all, many people also qualify for the Cologuard stool test   Prostate cancer screening: PSA blood test age 24-71  Lung cancer screening: not needed for non-smokers  Infection screenings   HIV: recommended screening at least once age 65-65, more often as needed.  Gonorrhea/Chlamydia: screening as needed  Hepatitis C: recommended once for everyone age 75-75  TB: certain at-risk populations, or depending on work requirements and/or travel history Other  Bone Density Test: recommended for men at age 2  Abdominal Aortic Aneurysm: not needed for non-smokers     Follow-up  plan: Return in about 4 months (around 08/01/2020) for monitor A1C .                                                 ################################################# ################################################# ################################################# #################################################    Current Meds  Medication Sig  allopurinol (ZYLOPRIM) 300 MG tablet Take 1 tablet (300 mg total) by mouth daily.   amLODipine (NORVASC) 5 MG tablet TAKE 1 TABLET BY MOUTH EVERYDAY AT BEDTIME   aspirin 325 MG tablet Take 1 tablet (325 mg total) by mouth daily.   atorvastatin (LIPITOR) 40 MG tablet TAKE 2 TABLETS (80 MG TOTAL) BY MOUTH AT BEDTIME.   buPROPion (WELLBUTRIN XL) 150 MG 24 hr tablet Take 1 tablet (150 mg total) by mouth daily.   clonazePAM (KLONOPIN) 1 MG tablet TAKE 1-1.5 TABLETS (1-1.5 MG TOTAL) BY MOUTH 2 (TWO) TIMES DAILY. MUST LAST 30 DAYS   hydrochlorothiazide (HYDRODIURIL) 12.5 MG tablet Take 1 tablet (12.5 mg total) by mouth every morning.   metoprolol (TOPROL-XL) 200 MG 24 hr tablet Take 1 tablet (200 mg total) by mouth daily.   [DISCONTINUED] metFORMIN (GLUCOPHAGE-XR) 500 MG 24 hr tablet Take 2 tablets (1,000 mg total) by mouth daily with breakfast.    Allergies  Allergen Reactions   Levaquin [Levofloxacin In D5w] Itching and Swelling   Edarbi [Azilsartan] Other (See Comments)    Depressed mood   Lisinopril Cough   Trazodone And Nefazodone Rash   Valsartan Rash       Review of Systems: Pertinent (+) and (-) ROS in HPI as above   Exam:  BP 137/78 (BP Location: Left Arm, Patient Position: Sitting, Cuff Size: Normal)    Pulse 68    Temp 98 F (36.7 C)    Ht 5' 9.69" (1.77 m)    Wt 216 lb 3.2 oz (98.1 kg)    SpO2 98%    BMI 31.30 kg/m   Constitutional: VS see above. General Appearance: alert, well-developed, well-nourished, NAD  Neck: No masses, trachea midline.   Respiratory:  Normal respiratory effort. no wheeze, no rhonchi, no rales  Cardiovascular: S1/S2 normal, no murmur, no rub/gallop auscultated. RRR.   Musculoskeletal: Gait normal. Symmetric and independent movement of all extremities  Abdominal: non-tender, non-distended, no appreciable organomegaly, neg Murphy's, BS WNLx4  Neurological: Normal balance/coordination. No tremor.  Skin: warm, dry, intact.   Psychiatric: Normal judgment/insight. Normal mood and affect. Oriented x3.       Visit summary with medication list and pertinent instructions was printed for patient to review, patient was advised to alert [place] if any updates are needed. All questions at time of visit were answered - patient instructed to contact office with any additional concerns. ER/RTC precautions were reviewed with the patient and understanding verbalized.       Please note: voice recognition software was used to produce this document, and typos may escape review. Please contact Dr. Lyn Hollingshead for any needed clarifications.    Follow up plan: Return in about 4 months (around 08/01/2020) for monitor A1C .

## 2020-04-03 NOTE — Progress Notes (Signed)
Pt states RUQ pain when taking metformin. Requesting change of medication consult.

## 2020-04-03 NOTE — Patient Instructions (Addendum)
General Preventive Care  Most recent routine screening labs: ordered.   Blood pressure goal 130/80 or less.   Tobacco: don't!   Alcohol: responsible moderation is ok for most adults - if you have concerns about your alcohol intake, please talk to me!   Exercise: as tolerated to reduce risk of cardiovascular disease and diabetes. Strength training will also prevent osteoporosis.   Mental health: if need for mental health care (medicines, counseling, other), or concerns about moods, please let me know!   Sexual / Reproductive health: if need for STD testing, or if concerns with libido/pain problems, please let me know!   Advanced Directive: Living Will and/or Healthcare Power of Attorney recommended for all adults, regardless of age or health.  Vaccines  Flu vaccine: recommended every fall/winter   Shingles vaccine: after age 10.   Pneumonia vaccines: after age 25.Marland Kitchen  Tetanus booster: every 10 years - due 2028  COVID vaccine: THANKS for getting your vaccine! :)  Cancer screenings   Colon cancer screening: for everyone age 38-75. Colonoscopy available for all, many people also qualify for the Cologuard stool test   Prostate cancer screening: PSA blood test age 107-71  Lung cancer screening: not needed for non-smokers  Infection screenings  . HIV: recommended screening at least once age 32-65, more often as needed. . Gonorrhea/Chlamydia: screening as needed . Hepatitis C: recommended once for everyone age 75-75 . TB: certain at-risk populations, or depending on work requirements and/or travel history Other . Bone Density Test: recommended for men at age 63 . Abdominal Aortic Aneurysm: not needed for non-smokers

## 2020-04-04 LAB — CBC
HCT: 45.8 % (ref 38.5–50.0)
Hemoglobin: 15.8 g/dL (ref 13.2–17.1)
MCH: 31 pg (ref 27.0–33.0)
MCHC: 34.5 g/dL (ref 32.0–36.0)
MCV: 89.8 fL (ref 80.0–100.0)
MPV: 11.7 fL (ref 7.5–12.5)
Platelets: 212 10*3/uL (ref 140–400)
RBC: 5.1 10*6/uL (ref 4.20–5.80)
RDW: 12.1 % (ref 11.0–15.0)
WBC: 7.4 10*3/uL (ref 3.8–10.8)

## 2020-04-04 LAB — COMPLETE METABOLIC PANEL WITH GFR
AG Ratio: 1.7 (calc) (ref 1.0–2.5)
ALT: 53 U/L — ABNORMAL HIGH (ref 9–46)
AST: 33 U/L (ref 10–35)
Albumin: 4.6 g/dL (ref 3.6–5.1)
Alkaline phosphatase (APISO): 59 U/L (ref 35–144)
BUN: 11 mg/dL (ref 7–25)
CO2: 27 mmol/L (ref 20–32)
Calcium: 9.8 mg/dL (ref 8.6–10.3)
Chloride: 100 mmol/L (ref 98–110)
Creat: 0.84 mg/dL (ref 0.70–1.33)
GFR, Est African American: 118 mL/min/{1.73_m2} (ref >=60)
GFR, Est Non African American: 102 mL/min/{1.73_m2} (ref >=60)
Globulin: 2.7 g/dL (calc) (ref 1.9–3.7)
Glucose, Bld: 107 mg/dL — ABNORMAL HIGH (ref 65–99)
Potassium: 4.3 mmol/L (ref 3.5–5.3)
Sodium: 138 mmol/L (ref 135–146)
Total Bilirubin: 0.7 mg/dL (ref 0.2–1.2)
Total Protein: 7.3 g/dL (ref 6.1–8.1)

## 2020-04-04 LAB — LIPID PANEL
Cholesterol: 128 mg/dL (ref <200)
HDL: 35 mg/dL — ABNORMAL LOW (ref >=40)
LDL Cholesterol (Calc): 76 mg/dL (calc)
Non-HDL Cholesterol (Calc): 93 mg/dL (calc) (ref <130)
Total CHOL/HDL Ratio: 3.7 (calc) (ref <5.0)
Triglycerides: 89 mg/dL (ref <150)

## 2020-04-04 LAB — URIC ACID: Uric Acid, Serum: 5.5 mg/dL (ref 4.0–8.0)

## 2020-04-05 ENCOUNTER — Encounter: Payer: Self-pay | Admitting: Osteopathic Medicine

## 2020-04-05 DIAGNOSIS — Z1211 Encounter for screening for malignant neoplasm of colon: Secondary | ICD-10-CM

## 2020-04-15 ENCOUNTER — Other Ambulatory Visit: Payer: Self-pay | Admitting: Osteopathic Medicine

## 2020-04-15 DIAGNOSIS — I10 Essential (primary) hypertension: Secondary | ICD-10-CM

## 2020-05-06 ENCOUNTER — Other Ambulatory Visit: Payer: Self-pay | Admitting: Osteopathic Medicine

## 2020-05-06 DIAGNOSIS — F4001 Agoraphobia with panic disorder: Secondary | ICD-10-CM

## 2020-05-08 ENCOUNTER — Other Ambulatory Visit: Payer: Self-pay | Admitting: Osteopathic Medicine

## 2020-05-08 DIAGNOSIS — F39 Unspecified mood [affective] disorder: Secondary | ICD-10-CM

## 2020-05-08 DIAGNOSIS — M1A9XX0 Chronic gout, unspecified, without tophus (tophi): Secondary | ICD-10-CM

## 2020-05-08 DIAGNOSIS — I10 Essential (primary) hypertension: Secondary | ICD-10-CM

## 2020-06-24 ENCOUNTER — Other Ambulatory Visit: Payer: Self-pay | Admitting: Osteopathic Medicine

## 2020-06-24 DIAGNOSIS — E785 Hyperlipidemia, unspecified: Secondary | ICD-10-CM

## 2020-06-24 DIAGNOSIS — I2510 Atherosclerotic heart disease of native coronary artery without angina pectoris: Secondary | ICD-10-CM

## 2020-08-01 ENCOUNTER — Encounter: Payer: Self-pay | Admitting: Osteopathic Medicine

## 2020-08-01 ENCOUNTER — Other Ambulatory Visit: Payer: Self-pay

## 2020-08-01 ENCOUNTER — Ambulatory Visit (INDEPENDENT_AMBULATORY_CARE_PROVIDER_SITE_OTHER): Payer: BC Managed Care – PPO | Admitting: Osteopathic Medicine

## 2020-08-01 VITALS — BP 132/88 | HR 75 | Temp 98.2°F | Wt 229.1 lb

## 2020-08-01 DIAGNOSIS — R7303 Prediabetes: Secondary | ICD-10-CM

## 2020-08-01 LAB — POCT GLYCOSYLATED HEMOGLOBIN (HGB A1C): Hemoglobin A1C: 8 % — AB (ref 4.0–5.6)

## 2020-08-01 MED ORDER — METFORMIN HCL ER 500 MG PO TB24: 500.0000 mg | ORAL_TABLET | Freq: Every day | ORAL | 1 refills | Status: DC

## 2020-08-01 NOTE — Progress Notes (Signed)
Jack [name] is a 50 y.o. male who presents to  [facility] at [facility]  today, 08/01/20, seeking care for the following:  . Prediabetes --> diabetes range A1C today. Metfromin 1000 mg was stopped last visit d/t pt wanted to see if he could do ok w/ it and the higher dose was causing GI side effects.    ASSESSMENT & PLAN with other pertinent findings:  The primary encounter diagnosis was Prediabetes. A diagnosis of Prediabetes was also pertinent to this visit.--> diabetes range A1C today    Restart metformin and aggressive diet/exercise  If A1C <7 next time, no concerns If not, would add Rx    There are no Patient Instructions on file for this visit.  Orders Placed This Encounter  Procedures  . POCT HgB A1C    Results for orders placed or performed in visit on 08/01/20 (from the past 24 hour(s))  POCT HgB A1C     Status: Abnormal   Collection Time: 08/01/20  2:42 PM  Result Value Ref Range   Hemoglobin A1C 8.0 (A) 4.0 - 5.6 %   HbA1c POC (<> result, manual entry)     HbA1c, POC (prediabetic range)     HbA1c, POC (controlled diabetic range)      Meds ordered this encounter  Medications  . metFORMIN (GLUCOPHAGE-XR) 500 MG 24 hr tablet    Sig: Take 1 tablet (500 mg total) by mouth daily with breakfast.    Dispense:  90 tablet    Refill:  1     See below for relevant physical exam findings  See below for recent lab and imaging results reviewed  Medications, allergies, PMH, PSH, SocH, FamH reviewed below    Follow-up instructions: Return in about 3 months (around 11/01/2020) for RECHECK A1C BACK ON METFORMIN XR 500 MG DAILY.                                        Exam:  BP 132/88 (BP Location: Left Arm, Patient Position: Sitting, Cuff Size: Normal)   Pulse 75   Temp 98.2 F (36.8 C) (Oral)   Wt 229 lb 1.3 oz (103.9 kg)   BMI 33.17 kg/m   Constitutional: VS see above. General  Appearance: alert, well-developed, well-nourished, NAD  Neck: No masses, trachea midline.   Respiratory: Normal respiratory effort.   Musculoskeletal: Gait normal.   Neurological: Normal balance/coordination. No tremor.  Skin: warm, dry, intact.   Psychiatric: Normal judgment/insight. Normal mood and affect. Oriented x3.   Current Meds  Medication Sig  . allopurinol (ZYLOPRIM) 300 MG tablet TAKE 1 TABLET BY MOUTH EVERY DAY  . amLODipine (NORVASC) 5 MG tablet TAKE 1 TABLET BY MOUTH EVERYDAY AT BEDTIME  . aspirin 325 MG tablet Take 1 tablet (325 mg total) by mouth daily.  Marland Kitchen atorvastatin (LIPITOR) 40 MG tablet TAKE 2 TABLETS (80 MG TOTAL) BY MOUTH AT BEDTIME.  Marland Kitchen buPROPion (WELLBUTRIN XL) 150 MG 24 hr tablet TAKE 1 TABLET BY MOUTH EVERY DAY  . clonazePAM (KLONOPIN) 1 MG tablet TAKE 1-1.5 TABLETS (1-1.5 MG TOTAL) BY MOUTH 2 (TWO) TIMES DAILY. MUST LAST 30 DAYS  . hydrochlorothiazide (HYDRODIURIL) 12.5 MG tablet TAKE 1 TABLET BY MOUTH EVERY MORNING  . metoprolol (TOPROL-XL) 200 MG 24 hr tablet TAKE 1 TABLET BY MOUTH EVERY DAY    Allergies  Allergen Reactions  . Levaquin [Levofloxacin  In D5w] Itching and Swelling  . Edarbi [Azilsartan] Other (See Comments)    Depressed mood  . Lisinopril Cough  . Trazodone And Nefazodone Rash  . Valsartan Rash    Patient Active Problem List   Diagnosis Date Noted  . LVH (left ventricular hypertrophy) due to hypertensive disease, without heart failure 11/17/2018  . Calcium channel blocker adverse reaction 11/17/2018  . Restrictive pattern present on pulmonary function testing 06/30/2017  . Blurred vision, right eye 06/22/2017  . Pulmonary emphysema (HCC) 06/22/2017  . Lower urinary tract symptoms (LUTS) 06/22/2017  . Cognitive changes 06/22/2017  . Chronic use of benzodiazepine for therapeutic purpose 06/22/2017  . Mixed obsessional thoughts and acts 04/09/2017  . Mood disorder (HCC) 04/09/2017  . Overweight (BMI 25.0-29.9) 12/01/2016  .  Encounter for medication management 12/01/2016  . HSV (herpes simplex virus) infection 12/01/2016  . History of gout 11/05/2016  . Prediabetes 11/03/2016  . History of CVA with residual deficit 11/03/2016  . Metabolic syndrome X 11/03/2016  . ASCVD (arteriosclerotic cardiovascular disease) 11/03/2016  . Hyperlipidemia LDL goal <70 11/03/2016  . Agoraphobia with panic attacks 07/03/2016  . Hypertension goal BP (blood pressure) < 130/80   . Anxiety   . Gout     Family History  Adopted: Yes  Problem Relation Age of Onset  . Stroke Neg Hx     Social History   Tobacco Use  Smoking Status Never Smoker  Smokeless Tobacco Never Used    Past Surgical History:  Procedure Laterality Date  . NO PAST SURGERIES      Immunization History  Administered Date(s) Administered  . PFIZER(Purple Top)SARS-COV-2 Vaccination 05/31/2019, 06/26/2019, 02/16/2020  . Tdap 11/03/2016    Recent Results (from the past 2160 hour(s))  POCT HgB A1C     Status: Abnormal   Collection Time: 08/01/20  2:42 PM  Result Value Ref Range   Hemoglobin A1C 8.0 (A) 4.0 - 5.6 %   HbA1c POC (<> result, manual entry)     HbA1c, POC (prediabetic range)     HbA1c, POC (controlled diabetic range)      No results found.     All questions at time of visit were answered - patient instructed to contact office with any additional concerns or updates. ER/RTC precautions were reviewed with the patient as applicable.   Please note: manual typing as well as voice recognition software may have been used to produce this document - typos may escape review. Please contact Dr. Lyn Hollingshead for any needed clarifications.

## 2020-08-15 ENCOUNTER — Other Ambulatory Visit: Payer: Self-pay | Admitting: Osteopathic Medicine

## 2020-08-15 DIAGNOSIS — F4001 Agoraphobia with panic disorder: Secondary | ICD-10-CM

## 2020-09-21 ENCOUNTER — Other Ambulatory Visit: Payer: Self-pay | Admitting: Osteopathic Medicine

## 2020-09-21 DIAGNOSIS — E785 Hyperlipidemia, unspecified: Secondary | ICD-10-CM

## 2020-09-21 DIAGNOSIS — I2510 Atherosclerotic heart disease of native coronary artery without angina pectoris: Secondary | ICD-10-CM

## 2020-10-31 ENCOUNTER — Ambulatory Visit (INDEPENDENT_AMBULATORY_CARE_PROVIDER_SITE_OTHER): Payer: BC Managed Care – PPO | Admitting: Osteopathic Medicine

## 2020-10-31 ENCOUNTER — Encounter: Payer: Self-pay | Admitting: Osteopathic Medicine

## 2020-10-31 VITALS — BP 125/84 | HR 58 | Temp 97.6°F | Wt 226.0 lb

## 2020-10-31 DIAGNOSIS — R7303 Prediabetes: Secondary | ICD-10-CM

## 2020-10-31 DIAGNOSIS — E1169 Type 2 diabetes mellitus with other specified complication: Secondary | ICD-10-CM

## 2020-10-31 LAB — POCT GLYCOSYLATED HEMOGLOBIN (HGB A1C): Hemoglobin A1C: 7.7 % — AB (ref 4.0–5.6)

## 2020-10-31 NOTE — Progress Notes (Signed)
Jack [name] is a 50 y.o. male who presents to  [facility] at [facility]  today, 10/31/20, seeking care for the following:  Very pleasant patient here today to follow up A1c / diabetes.  Patient states that he has some definite room for improvement in diet/exercise.  A1c is above goal but close.     ASSESSMENT & PLAN with other pertinent findings:  The primary encounter diagnosis was DM2. A diagnosis of Type 2 diabetes mellitus with other specified complication, without long-term current use of insulin (HCC) was also pertinent to this visit.   Gust option for additional medication versus get stricter about diet/lifestyle modifications.  Patient would like to hold off on addition of any medications, but I did advise close follow-up in the next 3 to 4 months to recheck.  Patient is aware that I will be leaving the practice, I think that he would be a good fit for either [facility] or Dr. Ashley Royalty, he was given the option to see anyone else on an as-needed basis until my replacement starts, or he can seek care at another clinic and we are happy to transfer records.  There are no Patient Instructions on file for this visit.  Orders Placed This Encounter  Procedures   POCT HgB A1C   Results for orders placed or performed in visit on 10/31/20 (from the past 24 hour(s))  POCT HgB A1C     Status: Abnormal   Collection Time: 10/31/20  2:52 PM  Result Value Ref Range   Hemoglobin A1C 7.7 (A) 4.0 - 5.6 %   HbA1c POC (<> result, manual entry)     HbA1c, POC (prediabetic range)     HbA1c, POC (controlled diabetic range)     Wt Readings from Last 3 Encounters:  10/31/20 226 lb 0.6 oz (102.5 kg)  08/01/20 229 lb 1.3 oz (103.9 kg)  04/03/20 216 lb 3.2 oz (98.1 kg)     No orders of the defined types were placed in this encounter.    See below for relevant physical exam findings  See below for recent lab and imaging results reviewed  Medications,  allergies, PMH, PSH, SocH, FamH reviewed below    Follow-up instructions: Return in about 3 months (around 01/31/2021) for MONITOR A1C - DR MATTHEWS .                                        Exam:  BP 125/84 (BP Location: Left Arm, Patient Position: Sitting, Cuff Size: Large)   Pulse (!) 58   Temp 97.6 F (36.4 C) (Oral)   Wt 226 lb 0.6 oz (102.5 kg)   BMI 32.73 kg/m  Constitutional: VS see above. General Appearance: alert, well-developed, well-nourished, NAD Neck: No masses, trachea midline.  Respiratory: Normal respiratory effort. no wheeze, no rhonchi, no rales Cardiovascular: S1/S2 normal, no murmur, no rub/gallop auscultated. RRR.  Musculoskeletal: Gait normal. Symmetric and independent movement of all extremities Neurological: Normal balance/coordination. No tremor. Skin: warm, dry, intact.  Psychiatric: Normal judgment/insight. Normal mood and affect. Oriented x3.   Current Meds  Medication Sig   allopurinol (ZYLOPRIM) 300 MG tablet TAKE 1 TABLET BY MOUTH EVERY DAY   amLODipine (NORVASC) 5 MG tablet TAKE 1 TABLET BY MOUTH EVERYDAY AT BEDTIME   aspirin 325 MG tablet Take 1 tablet (325 mg total) by mouth daily.   atorvastatin (LIPITOR) 40  MG tablet TAKE 2 TABLETS (80 MG TOTAL) BY MOUTH AT BEDTIME.   buPROPion (WELLBUTRIN XL) 150 MG 24 hr tablet TAKE 1 TABLET BY MOUTH EVERY DAY   clonazePAM (KLONOPIN) 1 MG tablet TAKE 1-1.5 TABLETS (1-1.5 MG TOTAL) BY MOUTH 2 (TWO) TIMES DAILY. MUST LAST 30 DAYS   hydrochlorothiazide (HYDRODIURIL) 12.5 MG tablet TAKE 1 TABLET BY MOUTH EVERY MORNING   metFORMIN (GLUCOPHAGE-XR) 500 MG 24 hr tablet Take 1 tablet (500 mg total) by mouth daily with breakfast.   metoprolol (TOPROL-XL) 200 MG 24 hr tablet TAKE 1 TABLET BY MOUTH EVERY DAY    Allergies  Allergen Reactions   Levaquin [Levofloxacin In D5w] Itching and Swelling   Edarbi [Azilsartan] Other (See Comments)    Depressed mood   Lisinopril Cough    Trazodone And Nefazodone Rash   Valsartan Rash    Patient Active Problem List   Diagnosis Date Noted   Type 2 diabetes mellitus with other specified complication (HCC) 10/31/2020   LVH (left ventricular hypertrophy) due to hypertensive disease, without heart failure 11/17/2018   Calcium channel blocker adverse reaction 11/17/2018   Restrictive pattern present on pulmonary function testing 06/30/2017   Blurred vision, right eye 06/22/2017   Pulmonary emphysema (HCC) 06/22/2017   Lower urinary tract symptoms (LUTS) 06/22/2017   Cognitive changes 06/22/2017   Chronic use of benzodiazepine for therapeutic purpose 06/22/2017   Mixed obsessional thoughts and acts 04/09/2017   Mood disorder (HCC) 04/09/2017   Overweight (BMI 25.0-29.9) 12/01/2016   Encounter for medication management 12/01/2016   HSV (herpes simplex virus) infection 12/01/2016   History of gout 11/05/2016   History of CVA with residual deficit 11/03/2016   Metabolic syndrome X 11/03/2016   ASCVD (arteriosclerotic cardiovascular disease) 11/03/2016   Hyperlipidemia LDL goal <70 11/03/2016   Agoraphobia with panic attacks 07/03/2016   Hypertension goal BP (blood pressure) < 130/80    Anxiety    Gout     Family History  Adopted: Yes  Problem Relation Age of Onset   Stroke Neg Hx     Social History   Tobacco Use  Smoking Status Never  Smokeless Tobacco Never    Past Surgical History:  Procedure Laterality Date   NO PAST SURGERIES      Immunization History  Administered Date(s) Administered   PFIZER(Purple Top)SARS-COV-2 Vaccination 05/31/2019, 06/26/2019, 02/16/2020   Tdap 11/03/2016    Recent Results (from the past 2160 hour(s))  POCT HgB A1C     Status: Abnormal   Collection Time: 10/31/20  2:52 PM  Result Value Ref Range   Hemoglobin A1C 7.7 (A) 4.0 - 5.6 %   HbA1c POC (<> result, manual entry)     HbA1c, POC (prediabetic range)     HbA1c, POC (controlled diabetic range)      No results  found.     All questions at time of visit were answered - patient instructed to contact office with any additional concerns or updates. ER/RTC precautions were reviewed with the patient as applicable.   Please note: manual typing as well as voice recognition software may have been used to produce this document - typos may escape review. Please contact Dr. Lyn Hollingshead for any needed clarifications.

## 2020-11-15 DIAGNOSIS — L301 Dyshidrosis [pompholyx]: Secondary | ICD-10-CM | POA: Diagnosis not present

## 2020-11-28 ENCOUNTER — Other Ambulatory Visit: Payer: Self-pay | Admitting: Osteopathic Medicine

## 2020-11-28 DIAGNOSIS — F4001 Agoraphobia with panic disorder: Secondary | ICD-10-CM

## 2021-01-23 ENCOUNTER — Other Ambulatory Visit: Payer: Self-pay | Admitting: Osteopathic Medicine

## 2021-01-23 DIAGNOSIS — R7303 Prediabetes: Secondary | ICD-10-CM

## 2021-01-23 DIAGNOSIS — R300 Dysuria: Secondary | ICD-10-CM | POA: Diagnosis not present

## 2021-01-23 DIAGNOSIS — R319 Hematuria, unspecified: Secondary | ICD-10-CM | POA: Diagnosis not present

## 2021-01-23 DIAGNOSIS — R198 Other specified symptoms and signs involving the digestive system and abdomen: Secondary | ICD-10-CM | POA: Diagnosis not present

## 2021-02-03 ENCOUNTER — Other Ambulatory Visit: Payer: Self-pay

## 2021-02-03 ENCOUNTER — Encounter: Payer: Self-pay | Admitting: Family Medicine

## 2021-02-03 ENCOUNTER — Ambulatory Visit (INDEPENDENT_AMBULATORY_CARE_PROVIDER_SITE_OTHER): Payer: BC Managed Care – PPO | Admitting: Family Medicine

## 2021-02-03 VITALS — BP 130/80 | HR 77 | Ht 69.6 in | Wt 218.0 lb

## 2021-02-03 DIAGNOSIS — E1169 Type 2 diabetes mellitus with other specified complication: Secondary | ICD-10-CM | POA: Diagnosis not present

## 2021-02-03 DIAGNOSIS — Z1211 Encounter for screening for malignant neoplasm of colon: Secondary | ICD-10-CM

## 2021-02-03 DIAGNOSIS — F4001 Agoraphobia with panic disorder: Secondary | ICD-10-CM

## 2021-02-03 DIAGNOSIS — Z8739 Personal history of other diseases of the musculoskeletal system and connective tissue: Secondary | ICD-10-CM

## 2021-02-03 DIAGNOSIS — M1A9XX0 Chronic gout, unspecified, without tophus (tophi): Secondary | ICD-10-CM | POA: Diagnosis not present

## 2021-02-03 DIAGNOSIS — I10 Essential (primary) hypertension: Secondary | ICD-10-CM

## 2021-02-03 DIAGNOSIS — E785 Hyperlipidemia, unspecified: Secondary | ICD-10-CM | POA: Diagnosis not present

## 2021-02-03 DIAGNOSIS — I6930 Unspecified sequelae of cerebral infarction: Secondary | ICD-10-CM

## 2021-02-03 NOTE — Assessment & Plan Note (Signed)
Continue statin, aspirin and management of associated risk factors.

## 2021-02-03 NOTE — Assessment & Plan Note (Signed)
We will continue bupropion with clonazepam as needed.Marland Kitchen

## 2021-02-03 NOTE — Assessment & Plan Note (Signed)
Continue allopurinol and update uric acid.

## 2021-02-03 NOTE — Patient Instructions (Signed)
Great to meet you today! We'll be in touch with lab results and recommendations.  Let's plan to follow up in 6 months.

## 2021-02-03 NOTE — Progress Notes (Signed)
Jack [name] - 50 y.o. male MRN [id number]  Date of birth: 28-Sep-1970  Subjective No chief complaint on file.   HPI Jack [name] is a 50 year old male here today for follow-up visit.  He is transferring care from Dr. Lyn [name].  He has history of hypertension, type 2 diabetes, previous CVA, gout and anxiety with panic.  Reports he is doing well with amlodipine, hydrochlorothiazide and metoprolol for management of hypertension.  No significant side effects related to this.  He has not had chest pain, shortness of breath, palpitations, headaches or vision changes.  He does continue on a full-strength aspirin.  History of prior CVA.  Tolerating atorvastatin well for management of hyperlipidemia.  He has been able to control diabetes with dietary changes.  He discontinued he denies any symptoms related to diabetes at this time.  He does use clonazepam as needed for anxiety with panic and agoraphobia.  Bupropion as well for associated depressive symptoms.  ROS:  A comprehensive ROS was completed and negative except as noted per HPI   . Allergies  Allergen Reactions   Levaquin [Levofloxacin In D5w] Itching and Swelling   Edarbi [Azilsartan] Other (See Comments)    Depressed mood   Lisinopril Cough   Trazodone And Nefazodone Rash   Valsartan Rash    Past Medical History:  Diagnosis Date   Anxiety    Gout    High cholesterol    Hypertension    Stroke [facility])     Past Surgical History:  Procedure Laterality Date   NO PAST SURGERIES      Social History   Socioeconomic History   Marital status: Married    Spouse name: Not on file   Number of children: 0   Years of education: Some college   Highest education level: Not on file  Occupational History   Occupation: [employer]  Tobacco Use   Smoking status: Never   Smokeless tobacco: Never  Vaping Use   Vaping Use: Never used  Substance and Sexual Activity   Alcohol use: No   Drug use: No   Sexual activity: Yes  Other  Topics Concern   Not on file  Social History Narrative   Lives w/ his partner   Right-handed   Caffeine: none   Social Determinants of Health   Financial Resource Strain: Not on file  Food Insecurity: Not on file  Transportation Needs: Not on file  Physical Activity: Not on file  Stress: Not on file  Social Connections: Not on file    Family History  Adopted: Yes  Problem Relation Age of Onset   Stroke Neg Hx     Health Maintenance  Topic Date Due   URINE MICROALBUMIN  Never done   Zoster Vaccines- Shingrix (1 of 2) Never done   COLONOSCOPY (Pts 45-52yrs Insurance coverage will need to be confirmed)  Never done   OPHTHALMOLOGY EXAM  09/01/2018   COVID-19 Vaccine (5 - Booster for Pfizer series) 10/18/2020   Pneumococcal Vaccine 73-28 Years old (1 - PCV) 10/31/2021 (Originally 06/27/1976)   HEMOGLOBIN A1C  05/03/2021   FOOT EXAM  02/03/2022   TETANUS/TDAP  11/04/2026   Hepatitis C Screening  Completed   HIV Screening  Completed   HPV VACCINES  Aged Out   INFLUENZA VACCINE  Discontinued     ----------------------------------------------------------------------------------------------------------------------------------------------------------------------------------------------------------------- Physical Exam BP 130/80 (BP Location: Left Arm, Patient Position: Sitting, Cuff Size: Large)   Pulse 77   Ht 5' 9.6" (1.768 m)   Wt 218 lb (98.9  kg)   SpO2 96%   BMI 31.64 kg/m   Physical Exam Constitutional:      Appearance: Normal appearance.  HENT:     Head: Normocephalic and atraumatic.  Eyes:     General: No scleral icterus. Cardiovascular:     Rate and Rhythm: Normal rate and regular rhythm.  Pulmonary:     Effort: Pulmonary effort is normal.     Breath sounds: Normal breath sounds.  Musculoskeletal:     Cervical back: Neck supple.  Neurological:     General: No focal deficit present.     Mental Status: He is alert.  Psychiatric:        Mood and Affect:  Mood normal.        Behavior: Behavior normal.    ------------------------------------------------------------------------------------------------------------------------------------------------------------------------------------------------------------------- Assessment and Plan  Type 2 diabetes mellitus with other specified complication (HCC) Lab Results  Component Value Date   HGBA1C 7.7 (A) 10/31/2020  He is not currently on any medication for treatment of his diabetes as he was not tolerating metformin well.  Updating A1c  Hypertension goal BP (blood pressure) < 130/80 Blood pressure well controlled at this time.  Continue current medications for management of hypertension.  Agoraphobia with panic attacks We will continue bupropion with clonazepam as needed.Marland Kitchen  History of CVA with residual deficit Continue statin, aspirin and management of associated risk factors.  History of gout Continue allopurinol and update uric acid.   No orders of the defined types were placed in this encounter.   Return in about 3 months (around 05/06/2021) for HTN/T2DM.    This visit occurred during the SARS-CoV-2 public health emergency.  Safety protocols were in place, including screening questions prior to the visit, additional usage of staff PPE, and extensive cleaning of exam room while observing appropriate contact time as indicated for disinfecting solutions.

## 2021-02-03 NOTE — Assessment & Plan Note (Signed)
Blood pressure well controlled at this time.  Continue current medications for management of hypertension. °

## 2021-02-03 NOTE — Assessment & Plan Note (Signed)
Lab Results  Component Value Date   HGBA1C 7.7 (A) 10/31/2020  He is not currently on any medication for treatment of his diabetes as he was not tolerating metformin well.  Updating A1c

## 2021-02-04 LAB — CBC WITH DIFFERENTIAL/PLATELET
Absolute Monocytes: 681 cells/uL (ref 200–950)
Basophils Absolute: 33 cells/uL (ref 0–200)
Basophils Relative: 0.4 %
Eosinophils Absolute: 249 cells/uL (ref 15–500)
Eosinophils Relative: 3 %
HCT: 46.5 % (ref 38.5–50.0)
Hemoglobin: 16.1 g/dL (ref 13.2–17.1)
Lymphs Abs: 2797 cells/uL (ref 850–3900)
MCH: 31.3 pg (ref 27.0–33.0)
MCHC: 34.6 g/dL (ref 32.0–36.0)
MCV: 90.3 fL (ref 80.0–100.0)
MPV: 12 fL (ref 7.5–12.5)
Monocytes Relative: 8.2 %
Neutro Abs: 4540 cells/uL (ref 1500–7800)
Neutrophils Relative %: 54.7 %
Platelets: 227 10*3/uL (ref 140–400)
RBC: 5.15 10*6/uL (ref 4.20–5.80)
RDW: 12.2 % (ref 11.0–15.0)
Total Lymphocyte: 33.7 %
WBC: 8.3 10*3/uL (ref 3.8–10.8)

## 2021-02-04 LAB — HEMOGLOBIN A1C
Hgb A1c MFr Bld: 6.9 % of total Hgb — ABNORMAL HIGH (ref <5.7)
Mean Plasma Glucose: 151 mg/dL
eAG (mmol/L): 8.4 mmol/L

## 2021-02-04 LAB — COMPLETE METABOLIC PANEL WITH GFR
AG Ratio: 1.7 (calc) (ref 1.0–2.5)
ALT: 77 U/L — ABNORMAL HIGH (ref 9–46)
AST: 59 U/L — ABNORMAL HIGH (ref 10–35)
Albumin: 4.7 g/dL (ref 3.6–5.1)
Alkaline phosphatase (APISO): 55 U/L (ref 35–144)
BUN: 12 mg/dL (ref 7–25)
CO2: 29 mmol/L (ref 20–32)
Calcium: 10.2 mg/dL (ref 8.6–10.3)
Chloride: 101 mmol/L (ref 98–110)
Creat: 0.94 mg/dL (ref 0.70–1.30)
Globulin: 2.8 g/dL (calc) (ref 1.9–3.7)
Glucose, Bld: 118 mg/dL — ABNORMAL HIGH (ref 65–99)
Potassium: 4.4 mmol/L (ref 3.5–5.3)
Sodium: 139 mmol/L (ref 135–146)
Total Bilirubin: 0.8 mg/dL (ref 0.2–1.2)
Total Protein: 7.5 g/dL (ref 6.1–8.1)
eGFR: 98 mL/min/{1.73_m2} (ref >=60)

## 2021-02-04 LAB — URIC ACID: Uric Acid, Serum: 5.8 mg/dL (ref 4.0–8.0)

## 2021-02-04 LAB — MICROALBUMIN / CREATININE URINE RATIO
Creatinine, Urine: 57 mg/dL (ref 20–320)
Microalb, Ur: <0.2 mg/dL

## 2021-02-04 LAB — LIPID PANEL W/REFLEX DIRECT LDL
Cholesterol: 137 mg/dL (ref <200)
HDL: 34 mg/dL — ABNORMAL LOW (ref >=40)
LDL Cholesterol (Calc): 81 mg/dL (calc)
Non-HDL Cholesterol (Calc): 103 mg/dL (calc) (ref <130)
Total CHOL/HDL Ratio: 4 (calc) (ref <5.0)
Triglycerides: 127 mg/dL (ref <150)

## 2021-02-20 DIAGNOSIS — Z1211 Encounter for screening for malignant neoplasm of colon: Secondary | ICD-10-CM | POA: Diagnosis not present

## 2021-02-26 LAB — COLOGUARD: COLOGUARD: NEGATIVE

## 2021-02-26 NOTE — Progress Notes (Signed)
Great news! Your Cologuard test is negative.  Recommend repeat colon cancer screening in 3 years.

## 2021-03-02 ENCOUNTER — Ambulatory Visit: Payer: BC Managed Care – PPO

## 2021-03-04 ENCOUNTER — Other Ambulatory Visit: Payer: Self-pay | Admitting: Osteopathic Medicine

## 2021-03-04 DIAGNOSIS — F4001 Agoraphobia with panic disorder: Secondary | ICD-10-CM

## 2021-03-04 DIAGNOSIS — K5730 Diverticulosis of large intestine without perforation or abscess without bleeding: Secondary | ICD-10-CM | POA: Diagnosis not present

## 2021-03-04 DIAGNOSIS — R1084 Generalized abdominal pain: Secondary | ICD-10-CM | POA: Diagnosis not present

## 2021-03-04 DIAGNOSIS — R161 Splenomegaly, not elsewhere classified: Secondary | ICD-10-CM | POA: Diagnosis not present

## 2021-03-04 DIAGNOSIS — R109 Unspecified abdominal pain: Secondary | ICD-10-CM | POA: Diagnosis not present

## 2021-03-04 DIAGNOSIS — R350 Frequency of micturition: Secondary | ICD-10-CM | POA: Diagnosis not present

## 2021-03-04 DIAGNOSIS — R300 Dysuria: Secondary | ICD-10-CM | POA: Diagnosis not present

## 2021-03-04 DIAGNOSIS — R3915 Urgency of urination: Secondary | ICD-10-CM | POA: Diagnosis not present

## 2021-03-05 ENCOUNTER — Other Ambulatory Visit: Payer: Self-pay

## 2021-03-05 ENCOUNTER — Encounter: Payer: Self-pay | Admitting: Family Medicine

## 2021-03-06 ENCOUNTER — Other Ambulatory Visit: Payer: Self-pay | Admitting: Family Medicine

## 2021-03-06 DIAGNOSIS — F4001 Agoraphobia with panic disorder: Secondary | ICD-10-CM

## 2021-03-06 MED ORDER — CLONAZEPAM 1 MG PO TABS: ORAL_TABLET | ORAL | 2 refills | Status: DC

## 2021-03-23 ENCOUNTER — Other Ambulatory Visit: Payer: Self-pay | Admitting: Osteopathic Medicine

## 2021-03-23 DIAGNOSIS — E785 Hyperlipidemia, unspecified: Secondary | ICD-10-CM

## 2021-03-23 DIAGNOSIS — I2510 Atherosclerotic heart disease of native coronary artery without angina pectoris: Secondary | ICD-10-CM

## 2021-04-04 ENCOUNTER — Other Ambulatory Visit: Payer: Self-pay | Admitting: Osteopathic Medicine

## 2021-04-04 DIAGNOSIS — I10 Essential (primary) hypertension: Secondary | ICD-10-CM

## 2021-04-06 ENCOUNTER — Other Ambulatory Visit: Payer: Self-pay | Admitting: Osteopathic Medicine

## 2021-04-06 DIAGNOSIS — I10 Essential (primary) hypertension: Secondary | ICD-10-CM

## 2021-04-27 DIAGNOSIS — J019 Acute sinusitis, unspecified: Secondary | ICD-10-CM | POA: Diagnosis not present

## 2021-04-30 ENCOUNTER — Other Ambulatory Visit: Payer: Self-pay | Admitting: Osteopathic Medicine

## 2021-04-30 DIAGNOSIS — F39 Unspecified mood [affective] disorder: Secondary | ICD-10-CM

## 2021-04-30 DIAGNOSIS — M1A9XX0 Chronic gout, unspecified, without tophus (tophi): Secondary | ICD-10-CM

## 2021-05-06 ENCOUNTER — Other Ambulatory Visit: Payer: Self-pay

## 2021-05-06 ENCOUNTER — Ambulatory Visit (INDEPENDENT_AMBULATORY_CARE_PROVIDER_SITE_OTHER): Payer: BC Managed Care – PPO | Admitting: Family Medicine

## 2021-05-06 ENCOUNTER — Encounter: Payer: Self-pay | Admitting: Family Medicine

## 2021-05-06 ENCOUNTER — Other Ambulatory Visit: Payer: Self-pay | Admitting: Osteopathic Medicine

## 2021-05-06 VITALS — BP 141/88 | HR 76 | Ht 69.0 in | Wt 223.0 lb

## 2021-05-06 DIAGNOSIS — I10 Essential (primary) hypertension: Secondary | ICD-10-CM | POA: Diagnosis not present

## 2021-05-06 DIAGNOSIS — E1169 Type 2 diabetes mellitus with other specified complication: Secondary | ICD-10-CM

## 2021-05-06 DIAGNOSIS — R7303 Prediabetes: Secondary | ICD-10-CM

## 2021-05-06 DIAGNOSIS — E785 Hyperlipidemia, unspecified: Secondary | ICD-10-CM | POA: Diagnosis not present

## 2021-05-06 LAB — POCT GLYCOSYLATED HEMOGLOBIN (HGB A1C): HbA1c, POC (controlled diabetic range): 7.7 % — AB (ref 0.0–7.0)

## 2021-05-06 MED ORDER — HYDROCHLOROTHIAZIDE 12.5 MG PO TABS: ORAL_TABLET | ORAL | 3 refills | Status: DC

## 2021-05-06 MED ORDER — METFORMIN HCL ER 500 MG PO TB24: ORAL_TABLET | ORAL | 3 refills | Status: DC

## 2021-05-06 MED ORDER — METOPROLOL SUCCINATE ER 200 MG PO TB24: 200.0000 mg | ORAL_TABLET | Freq: Every day | ORAL | 3 refills | Status: DC

## 2021-05-06 MED ORDER — AMLODIPINE BESYLATE 5 MG PO TABS: ORAL_TABLET | ORAL | 3 refills | Status: DC

## 2021-05-06 NOTE — Assessment & Plan Note (Signed)
Lab Results  Component Value Date   HGBA1C 7.7 (A) 05/06/2021  Blood sugar control has worsened since last visit.  We discussed making changes to improve diet as well as incorporation of regular exercise.  Adding metformin back on.  Return in 3 months (on 08/03/2021) for T2DM.

## 2021-05-06 NOTE — Assessment & Plan Note (Signed)
Blood pressure is mildly elevated today.  Readings at home have been well controlled.  He will continue current medications for management of hypertension.

## 2021-05-06 NOTE — Progress Notes (Signed)
Jack [name] - 51 y.o. male MRN [id number]  Date of birth: [birth year]  Subjective Chief Complaint  Patient presents with   Diabetes    HPI Jack [name] is a 51 year old male here today for follow-up visit.  Reports overall Jack [name] is feeling well.  Blood sugars have been trending up since last visit.  Metformin was discontinued previously due to possible intolerance.  Jack [name] was having some right-sided abdominal pain which resolved after stopping metformin.  Jack [name] has had this in the past and reports being able to tolerate metformin for 5 to 6 months before right-sided pain returns.  Jack [name] would be willing to add this on and consider stopping intermittently if pain returns.  Blood pressures remain well controlled with current medications.  Jack [name] is tolerating these well without side effects.  Jack [name] denies chest pain, shortness of breath, palpitations, headaches or vision changes.  ROS:  A comprehensive ROS was completed and negative except as noted per HPI  Allergies  Allergen Reactions   Levaquin [Levofloxacin In D5w] Itching and Swelling   Edarbi [Azilsartan] Other (See Comments)    Depressed mood   Lisinopril Cough   Trazodone And Nefazodone Rash   Valsartan Rash    Past Medical History:  Diagnosis Date   Anxiety    Gout    High cholesterol    Hypertension    Stroke [facility])     Past Surgical History:  Procedure Laterality Date   NO PAST SURGERIES      Social History   Socioeconomic History   Marital status: Married    Spouse name: Not on file   Number of children: 0   Years of education: Some college   Highest education level: Not on file  Occupational History   Occupation: [employer]  Tobacco Use   Smoking status: Never   Smokeless tobacco: Never  Vaping Use   Vaping Use: Never used  Substance and Sexual Activity   Alcohol use: No   Drug use: No   Sexual activity: Yes  Other Topics Concern   Not on file  Social History Narrative   Lives w/ his partner   Right-handed    Caffeine: none   Social Determinants of Health   Financial Resource Strain: Not on file  Food Insecurity: Not on file  Transportation Needs: Not on file  Physical Activity: Not on file  Stress: Not on file  Social Connections: Not on file    Family History  Adopted: Yes  Problem Relation Age of Onset   Stroke Neg Hx     Health Maintenance  Topic Date Due   Zoster Vaccines- Shingrix (1 of 2) Never done   COLONOSCOPY (Pts 45-7yrs Insurance coverage will need to be confirmed)  Never done   OPHTHALMOLOGY EXAM  09/01/2018   COVID-19 Vaccine (5 - Booster for Pfizer series) 10/18/2020   HEMOGLOBIN A1C  11/03/2021   FOOT EXAM  02/03/2022   URINE MICROALBUMIN  02/03/2022   TETANUS/TDAP  11/04/2026   Hepatitis C Screening  Completed   HIV Screening  Completed   HPV VACCINES  Aged Out   INFLUENZA VACCINE  Discontinued     ----------------------------------------------------------------------------------------------------------------------------------------------------------------------------------------------------------------- Physical Exam BP (!) 141/88 (BP Location: Left Arm, Patient Position: Sitting, Cuff Size: Large)    Pulse 76    Ht 5\' 9"  (1.753 m)    Wt 223 lb (101.2 kg)    SpO2 93%    BMI 32.93 kg/m   Physical Exam Constitutional:      Appearance: Normal appearance.  Eyes:     General: No scleral icterus. Cardiovascular:     Rate and Rhythm: Normal rate and regular rhythm.  Musculoskeletal:     Cervical back: Neck supple.  Neurological:     Mental Status: Jack [name] is alert.  Psychiatric:        Mood and Affect: Mood normal.        Behavior: Behavior normal.    ------------------------------------------------------------------------------------------------------------------------------------------------------------------------------------------------------------------- Assessment and Plan  Hypertension goal BP (blood pressure) < 130/80 Blood pressure is mildly  elevated today.  Readings at home have been well controlled.  Jack [name] will continue current medications for management of hypertension.  Type 2 diabetes mellitus with other specified complication (HCC) Lab Results  Component Value Date   HGBA1C 7.7 (A) 05/06/2021  Blood sugar control has worsened since last visit.  We discussed making changes to improve diet as well as incorporation of regular exercise.  Adding metformin back on.  Return in 3 months (on 08/03/2021) for T2DM.   Hyperlipidemia LDL goal <70 Lab Results  Component Value Date   LDLCALC 81 02/03/2021  Continue atorvastatin at current strength.   Meds ordered this encounter  Medications   metFORMIN (GLUCOPHAGE-XR) 500 MG 24 hr tablet    Sig: TAKE 1 TABLET BY MOUTH EVERY DAY WITH BREAKFAST    Dispense:  90 tablet    Refill:  3   amLODipine (NORVASC) 5 MG tablet    Sig: TAKE 1 TABLET BY MOUTH EVERYDAY AT BEDTIME    Dispense:  90 tablet    Refill:  3    DX Code Needed  .   hydrochlorothiazide (HYDRODIURIL) 12.5 MG tablet    Sig: TAKE 1 TABLET BY MOUTH EVERY DAY IN THE MORNING    Dispense:  90 tablet    Refill:  3   metoprolol (TOPROL-XL) 200 MG 24 hr tablet    Sig: Take 1 tablet (200 mg total) by mouth daily.    Dispense:  90 tablet    Refill:  3    DX Code Needed  .    Return in 3 months (on 08/03/2021) for T2DM.    This visit occurred during the SARS-CoV-2 public health emergency.  Safety protocols were in place, including screening questions prior to the visit, additional usage of staff PPE, and extensive cleaning of exam room while observing appropriate contact time as indicated for disinfecting solutions.

## 2021-05-06 NOTE — Assessment & Plan Note (Signed)
Lab Results  Component Value Date   LDLCALC 81 02/03/2021  Continue atorvastatin at current strength.

## 2021-06-11 ENCOUNTER — Other Ambulatory Visit: Payer: Self-pay | Admitting: Family Medicine

## 2021-06-11 DIAGNOSIS — F4001 Agoraphobia with panic disorder: Secondary | ICD-10-CM

## 2021-08-04 ENCOUNTER — Encounter: Payer: Self-pay | Admitting: Family Medicine

## 2021-08-04 ENCOUNTER — Ambulatory Visit (INDEPENDENT_AMBULATORY_CARE_PROVIDER_SITE_OTHER): Payer: BC Managed Care – PPO | Admitting: Family Medicine

## 2021-08-04 VITALS — BP 126/86 | HR 84 | Ht 69.0 in | Wt 230.0 lb

## 2021-08-04 DIAGNOSIS — E785 Hyperlipidemia, unspecified: Secondary | ICD-10-CM

## 2021-08-04 DIAGNOSIS — E1169 Type 2 diabetes mellitus with other specified complication: Secondary | ICD-10-CM

## 2021-08-04 DIAGNOSIS — F4001 Agoraphobia with panic disorder: Secondary | ICD-10-CM | POA: Diagnosis not present

## 2021-08-04 DIAGNOSIS — I10 Essential (primary) hypertension: Secondary | ICD-10-CM

## 2021-08-04 LAB — POCT GLYCOSYLATED HEMOGLOBIN (HGB A1C): HbA1c, POC (controlled diabetic range): 7.6 % — AB (ref 0.0–7.0)

## 2021-08-04 MED ORDER — TIRZEPATIDE 5 MG/0.5ML ~~LOC~~ SOAJ: 5.0000 mg | SUBCUTANEOUS | 0 refills | Status: AC

## 2021-08-04 MED ORDER — TIRZEPATIDE 7.5 MG/0.5ML ~~LOC~~ SOAJ: 7.5000 mg | SUBCUTANEOUS | 0 refills | Status: DC

## 2021-08-04 MED ORDER — FREESTYLE LIBRE 3 SENSOR MISC
6 refills | Status: DC
Start: 2021-08-04 — End: 2022-03-17

## 2021-08-04 MED ORDER — TIRZEPATIDE 2.5 MG/0.5ML ~~LOC~~ SOAJ: 2.5000 mg | SUBCUTANEOUS | 0 refills | Status: DC

## 2021-08-04 NOTE — Assessment & Plan Note (Signed)
Blood pressures well controlled at this time.  Recommend continuation of current medications for treatment of his hypertension.

## 2021-08-04 NOTE — Assessment & Plan Note (Signed)
Tolerating atorvastatin well at current strength.  Recommend continuation. 

## 2021-08-04 NOTE — Progress Notes (Signed)
Jack [name] - 51 y.o. male MRN [id number]  Date of birth: January 07, 1971  Subjective No chief complaint on file.   HPI Jack [name] is a 51 y.o. male here today for follow up.   Doing well with metformin for management of diabetes, no side effects at this time. He does exercise occasionally.  Denies symptoms related to diabetes.  Continues to tolerate atorvastatin well for associated HLD.   BP has been well controlled with combination of metoprolol, hctz and amlodipine.  Doing well with these without side effects.  He has not had chest pain, shortness of breath, palpitations, headache or vision changes.   History of anxiety and panic attacks.  Taking bupropion with clonazepam as needed.  This continues to work well for him without significant side effects.    ROS:  A comprehensive ROS was completed and negative except as noted per HPI  Allergies  Allergen Reactions   Levaquin [Levofloxacin In D5w] Itching and Swelling   Edarbi [Azilsartan] Other (See Comments)    Depressed mood   Lisinopril Cough   Trazodone And Nefazodone Rash   Valsartan Rash    Past Medical History:  Diagnosis Date   Anxiety    Gout    High cholesterol    Hypertension    Stroke [facility])     Past Surgical History:  Procedure Laterality Date   NO PAST SURGERIES      Social History   Socioeconomic History   Marital status: Married    Spouse name: Not on file   Number of children: 0   Years of education: Some college   Highest education level: Not on file  Occupational History   Occupation: [employer]  Tobacco Use   Smoking status: Never   Smokeless tobacco: Never  Vaping Use   Vaping Use: Never used  Substance and Sexual Activity   Alcohol use: No   Drug use: No   Sexual activity: Yes  Other Topics Concern   Not on file  Social History Narrative   Lives w/ his partner   Right-handed   Caffeine: none   Social Determinants of Health   Financial Resource Strain: Not on file   Food Insecurity: Not on file  Transportation Needs: Not on file  Physical Activity: Not on file  Stress: Not on file  Social Connections: Not on file    Family History  Adopted: Yes  Problem Relation Age of Onset   Stroke Neg Hx     Health Maintenance  Topic Date Due   Zoster Vaccines- Shingrix (1 of 2) Never done   COLONOSCOPY (Pts 45-77yrs Insurance coverage will need to be confirmed)  Never done   OPHTHALMOLOGY EXAM  09/01/2018   COVID-19 Vaccine (5 - Booster for Pfizer series) 10/18/2020   HEMOGLOBIN A1C  11/03/2021   FOOT EXAM  02/03/2022   URINE MICROALBUMIN  02/03/2022   TETANUS/TDAP  11/04/2026   Hepatitis C Screening  Completed   HIV Screening  Completed   HPV VACCINES  Aged Out   INFLUENZA VACCINE  Discontinued     ----------------------------------------------------------------------------------------------------------------------------------------------------------------------------------------------------------------- Physical Exam BP 126/86 (BP Location: Left Arm, Patient Position: Sitting, Cuff Size: Normal)   Pulse 84   Ht 5\' 9"  (1.753 m)   Wt 230 lb (104.3 kg)   SpO2 96%   BMI 33.97 kg/m   Physical Exam Constitutional:      Appearance: Normal appearance.  Eyes:     General: No scleral icterus. Cardiovascular:     Rate and  Rhythm: Normal rate and regular rhythm.  Pulmonary:     Effort: Pulmonary effort is normal.     Breath sounds: Normal breath sounds.  Musculoskeletal:     Cervical back: Neck supple.  Neurological:     General: No focal deficit present.     Mental Status: He is alert.  Psychiatric:        Mood and Affect: Mood normal.        Behavior: Behavior normal.    ------------------------------------------------------------------------------------------------------------------------------------------------------------------------------------------------------------------- Assessment and Plan  Hypertension goal BP (blood  pressure) < 130/80 Blood pressures well controlled at this time.  Recommend continuation of current medications for treatment of his hypertension.  Type 2 diabetes mellitus with other specified complication (HCC) Diabetes is stable, however we discussed adding GLP-1 to help with blood sugar control and weight management.  Blood sugars have fluctuated some at home I think using a continuous glucose monitor to monitor this would be more effective for him.  Given sample of freestyle libre today.  Also given sample of Mounjaro 2.5 mg to start for the first month.  Agoraphobia with panic attacks Continue on bupropion and clonazepam as needed.  Hyperlipidemia LDL goal <70 Tolerating atorvastatin well at current strength.  Recommend continuation.   Meds ordered this encounter  Medications   tirzepatide (MOUNJARO) 7.5 MG/0.5ML Pen    Sig: Inject 7.5 mg into the skin once a week. Start after completion of 5mg  pens    Dispense:  6 mL    Refill:  0   tirzepatide (MOUNJARO) 5 MG/0.5ML Pen    Sig: Inject 5 mg into the skin once a week. Start after completion of 2.5mg  sample pens.    Dispense:  2 mL    Refill:  0   Continuous Blood Gluc Sensor (FREESTYLE LIBRE 3 SENSOR) MISC    Sig: Place 1 sensor on the skin every 14 days. Use to check glucose continuously    Dispense:  2 each    Refill:  6   tirzepatide (MOUNJARO) 2.5 MG/0.5ML Pen    Sig: Inject 2.5 mg into the skin once a week. Lot: A Exp: 02/20/23    Dispense:  2 mL    Refill:  0    No follow-ups on file.    This visit occurred during the SARS-CoV-2 public health emergency.  Safety protocols were in place, including screening questions prior to the visit, additional usage of staff PPE, and extensive cleaning of exam room while observing appropriate contact time as indicated for disinfecting solutions.

## 2021-08-04 NOTE — Patient Instructions (Signed)
Lets try adding mounjaro 2.5mg  for 1 month then increase to 5mg  for 1 month then 7.5mg  until follow up.  Use freestyle libre for glucose monitoring.   See me again in 3-4 months.

## 2021-08-04 NOTE — Assessment & Plan Note (Signed)
Diabetes is stable, however we discussed adding GLP-1 to help with blood sugar control and weight management.  Blood sugars have fluctuated some at home I think using a continuous glucose monitor to monitor this would be more effective for him.  Given sample of freestyle libre today.  Also given sample of Mounjaro 2.5 mg to start for the first month.

## 2021-08-04 NOTE — Assessment & Plan Note (Signed)
Continue on bupropion and clonazepam as needed.

## 2021-08-06 ENCOUNTER — Encounter: Payer: Self-pay | Admitting: Family Medicine

## 2021-08-06 ENCOUNTER — Telehealth: Payer: Self-pay

## 2021-08-06 NOTE — Telephone Encounter (Signed)
Initiated Prior authorization PFX:[id number] 5MG /0.5ML pen-injectors Via: Covermymeds Case/Key:[id number] Status: approved  as of 08/06/21 Reason:Effective from 08/05/2021 through 08/04/2022.   Notified Pt via: Mychart

## 2021-09-17 ENCOUNTER — Other Ambulatory Visit: Payer: Self-pay | Admitting: Family Medicine

## 2021-09-17 DIAGNOSIS — F4001 Agoraphobia with panic disorder: Secondary | ICD-10-CM

## 2021-09-19 ENCOUNTER — Other Ambulatory Visit: Payer: Self-pay | Admitting: Family Medicine

## 2021-09-19 DIAGNOSIS — E785 Hyperlipidemia, unspecified: Secondary | ICD-10-CM

## 2021-09-19 DIAGNOSIS — I2510 Atherosclerotic heart disease of native coronary artery without angina pectoris: Secondary | ICD-10-CM

## 2021-10-22 ENCOUNTER — Other Ambulatory Visit: Payer: Self-pay | Admitting: Family Medicine

## 2021-10-22 DIAGNOSIS — F4001 Agoraphobia with panic disorder: Secondary | ICD-10-CM

## 2021-10-24 ENCOUNTER — Encounter: Payer: Self-pay | Admitting: Family Medicine

## 2021-11-04 ENCOUNTER — Encounter: Payer: Self-pay | Admitting: Family Medicine

## 2021-11-04 ENCOUNTER — Ambulatory Visit (INDEPENDENT_AMBULATORY_CARE_PROVIDER_SITE_OTHER): Payer: BC Managed Care – PPO | Admitting: Family Medicine

## 2021-11-04 VITALS — BP 120/76 | HR 88 | Temp 98.0°F | Ht 72.0 in | Wt 198.0 lb

## 2021-11-04 DIAGNOSIS — I10 Essential (primary) hypertension: Secondary | ICD-10-CM

## 2021-11-04 DIAGNOSIS — F39 Unspecified mood [affective] disorder: Secondary | ICD-10-CM

## 2021-11-04 DIAGNOSIS — Z125 Encounter for screening for malignant neoplasm of prostate: Secondary | ICD-10-CM

## 2021-11-04 DIAGNOSIS — E1169 Type 2 diabetes mellitus with other specified complication: Secondary | ICD-10-CM | POA: Diagnosis not present

## 2021-11-04 MED ORDER — TIRZEPATIDE 12.5 MG/0.5ML ~~LOC~~ SOAJ: 12.5000 mg | SUBCUTANEOUS | 1 refills | Status: DC

## 2021-11-04 NOTE — Assessment & Plan Note (Signed)
Blood pressure well controlled at this time.  He has had to reduce his amlodipine due to symptoms of hypotension.  I think is fine to continue at this reduced dose.

## 2021-11-04 NOTE — Assessment & Plan Note (Signed)
He has done quite well with Mounjaro.  Weight is down about 30 pounds.  I expect his blood sugars to be improved significantly as well.  Updated labs ordered.  We will continue titration of Mounjaro.

## 2021-11-04 NOTE — Progress Notes (Signed)
RALLY OUCH - 51 y.o. male MRN [id number]  Date of birth: Jan 27, 1971  Subjective Chief Complaint  Patient presents with   Diabetes    HPI Jack [name] is a 51 year old male here today for follow-up visit.  He has done remarkably well with Mounjaro.  Weight is down approximately 32 pounds since last visit.  Appetite has been decreased.  Feels good at current weight.  Goal is to lose a few more pounds.  He has not had any significant negative side effects from [facility].  He has had difficulty finding the 10 mg in stock and would like to try moving to the 12.5 mg.  Current dose is 7.5 mg.  He has had to reduce his amlodipine to half a tablet due to some symptoms of hypotension including dizziness.  ROS:  A comprehensive ROS was completed and negative except as noted per HPI   Allergies  Allergen Reactions   Levaquin [Levofloxacin In D5w] Itching and Swelling   Edarbi [Azilsartan] Other (See Comments)    Depressed mood   Lisinopril Cough   Trazodone And Nefazodone Rash   Valsartan Rash    Past Medical History:  Diagnosis Date   Anxiety    Gout    High cholesterol    Hypertension    Stroke [facility])     Past Surgical History:  Procedure Laterality Date   NO PAST SURGERIES      Social History   Socioeconomic History   Marital status: Married    Spouse name: Not on file   Number of children: 0   Years of education: Some college   Highest education level: Not on file  Occupational History   Occupation: [employer]  Tobacco Use   Smoking status: Never   Smokeless tobacco: Never  Vaping Use   Vaping Use: Never used  Substance and Sexual Activity   Alcohol use: No   Drug use: No   Sexual activity: Yes  Other Topics Concern   Not on file  Social History Narrative   Lives w/ his partner   Right-handed   Caffeine: none   Social Determinants of Health   Financial Resource Strain: Not on file  Food Insecurity: Not on file  Transportation Needs: Not on file  Physical  Activity: Not on file  Stress: Not on file  Social Connections: Not on file    Family History  Adopted: Yes  Problem Relation Age of Onset   Stroke Neg Hx     Health Maintenance  Topic Date Due   OPHTHALMOLOGY EXAM  11/14/2021 (Originally 09/01/2018)   COVID-19 Vaccine (5 - Pfizer risk series) 11/14/2021 (Originally 10/18/2020)   Zoster Vaccines- Shingrix (1 of 2) 11/14/2021 (Originally 06/26/1988)   Diabetic kidney evaluation - GFR measurement  02/03/2022   Diabetic kidney evaluation - Urine ACR  02/03/2022   FOOT EXAM  02/03/2022   HEMOGLOBIN A1C  02/04/2022   Fecal DNA (Cologuard)  02/21/2024   TETANUS/TDAP  11/04/2026   Hepatitis C Screening  Completed   HIV Screening  Completed   HPV VACCINES  Aged Out   INFLUENZA VACCINE  Discontinued     ----------------------------------------------------------------------------------------------------------------------------------------------------------------------------------------------------------------- Physical Exam BP 120/76 (BP Location: Left Arm, Patient Position: Sitting, Cuff Size: Normal)   Pulse 88   Temp 98 F (36.7 C) (Oral)   Ht 6' (1.829 m)   Wt 198 lb (89.8 kg)   SpO2 97%   BMI 26.85 kg/m   Physical Exam Constitutional:      Appearance: Normal appearance.  Eyes:     General: No scleral icterus. Cardiovascular:     Rate and Rhythm: Normal rate and regular rhythm.  Pulmonary:     Effort: Pulmonary effort is normal.     Breath sounds: Normal breath sounds.  Musculoskeletal:     Cervical back: Neck supple.  Neurological:     Mental Status: He is alert.  Psychiatric:        Mood and Affect: Mood normal.        Behavior: Behavior normal.     ------------------------------------------------------------------------------------------------------------------------------------------------------------------------------------------------------------------- Assessment and Plan  Hypertension goal BP (blood  pressure) < 130/80 Blood pressure well controlled at this time.  He has had to reduce his amlodipine due to symptoms of hypotension.  I think is fine to continue at this reduced dose.  Type 2 diabetes mellitus with other specified complication [facility]) He has done quite well with Mounjaro.  Weight is down about 30 pounds.  I expect his blood sugars to be improved significantly as well.  Updated labs ordered.  We will continue titration of Mounjaro.  Mood disorder (HCC) Stable with current medications.  We will plan to continue.   Meds ordered this encounter  Medications   tirzepatide (MOUNJARO) 12.5 MG/0.5ML Pen    Sig: Inject 12.5 mg into the skin once a week.    Dispense:  6 mL    Refill:  1    Return in about 4 months (around 03/06/2022) for HTN/T2DM.    This visit occurred during the SARS-CoV-2 public health emergency.  Safety protocols were in place, including screening questions prior to the visit, additional usage of staff PPE, and extensive cleaning of exam room while observing appropriate contact time as indicated for disinfecting solutions.

## 2021-11-04 NOTE — Assessment & Plan Note (Signed)
Stable with current medications.  We will plan to continue. 

## 2021-11-04 NOTE — Patient Instructions (Signed)
You can increase your Mounjaro to 12.5mg .  Continue to work on diet and lifestyle change.  We'll be in touch with lab results.  See me again in 6 months.

## 2021-11-06 LAB — CBC WITH DIFFERENTIAL/PLATELET
Absolute Monocytes: 599 cells/uL (ref 200–950)
Basophils Absolute: 16 cells/uL (ref 0–200)
Basophils Relative: 0.2 %
Eosinophils Absolute: 197 cells/uL (ref 15–500)
Eosinophils Relative: 2.4 %
HCT: 46.8 % (ref 38.5–50.0)
Hemoglobin: 15.8 g/dL (ref 13.2–17.1)
Lymphs Abs: 3214 cells/uL (ref 850–3900)
MCH: 31.2 pg (ref 27.0–33.0)
MCHC: 33.8 g/dL (ref 32.0–36.0)
MCV: 92.5 fL (ref 80.0–100.0)
MPV: 12.1 fL (ref 7.5–12.5)
Monocytes Relative: 7.3 %
Neutro Abs: 4174 cells/uL (ref 1500–7800)
Neutrophils Relative %: 50.9 %
Platelets: 179 10*3/uL (ref 140–400)
RBC: 5.06 10*6/uL (ref 4.20–5.80)
RDW: 13.1 % (ref 11.0–15.0)
Total Lymphocyte: 39.2 %
WBC: 8.2 10*3/uL (ref 3.8–10.8)

## 2021-11-06 LAB — LIPID PANEL W/REFLEX DIRECT LDL
Cholesterol: 108 mg/dL (ref <200)
HDL: 36 mg/dL — ABNORMAL LOW (ref >=40)
LDL Cholesterol (Calc): 54 mg/dL (calc)
Non-HDL Cholesterol (Calc): 72 mg/dL (calc) (ref <130)
Total CHOL/HDL Ratio: 3 (calc) (ref <5.0)
Triglycerides: 95 mg/dL (ref <150)

## 2021-11-06 LAB — COMPLETE METABOLIC PANEL WITH GFR
AG Ratio: 2 (calc) (ref 1.0–2.5)
ALT: 38 U/L (ref 9–46)
AST: 27 U/L (ref 10–35)
Albumin: 4.5 g/dL (ref 3.6–5.1)
Alkaline phosphatase (APISO): 64 U/L (ref 35–144)
BUN: 11 mg/dL (ref 7–25)
CO2: 28 mmol/L (ref 20–32)
Calcium: 9.9 mg/dL (ref 8.6–10.3)
Chloride: 106 mmol/L (ref 98–110)
Creat: 0.95 mg/dL (ref 0.70–1.30)
Globulin: 2.3 g/dL (calc) (ref 1.9–3.7)
Glucose, Bld: 104 mg/dL — ABNORMAL HIGH (ref 65–99)
Potassium: 4.4 mmol/L (ref 3.5–5.3)
Sodium: 143 mmol/L (ref 135–146)
Total Bilirubin: 0.8 mg/dL (ref 0.2–1.2)
Total Protein: 6.8 g/dL (ref 6.1–8.1)
eGFR: 96 mL/min/{1.73_m2} (ref >=60)

## 2021-11-06 LAB — HEMOGLOBIN A1C
Hgb A1c MFr Bld: 5.6 % of total Hgb (ref <5.7)
Mean Plasma Glucose: 114 mg/dL
eAG (mmol/L): 6.3 mmol/L

## 2021-11-06 LAB — MICROALBUMIN / CREATININE URINE RATIO
Creatinine, Urine: 297 mg/dL (ref 20–320)
Microalb Creat Ratio: 6 mcg/mg creat (ref <30)
Microalb, Ur: 1.8 mg/dL

## 2021-11-06 LAB — PSA: PSA: 0.83 ng/mL

## 2021-11-22 DIAGNOSIS — R5383 Other fatigue: Secondary | ICD-10-CM | POA: Diagnosis not present

## 2021-11-22 DIAGNOSIS — R509 Fever, unspecified: Secondary | ICD-10-CM | POA: Diagnosis not present

## 2021-11-22 DIAGNOSIS — R059 Cough, unspecified: Secondary | ICD-10-CM | POA: Diagnosis not present

## 2021-11-26 ENCOUNTER — Other Ambulatory Visit: Payer: Self-pay | Admitting: Family Medicine

## 2021-11-26 DIAGNOSIS — F4001 Agoraphobia with panic disorder: Secondary | ICD-10-CM

## 2021-12-30 ENCOUNTER — Other Ambulatory Visit: Payer: Self-pay | Admitting: Family Medicine

## 2021-12-30 DIAGNOSIS — F4001 Agoraphobia with panic disorder: Secondary | ICD-10-CM

## 2021-12-31 NOTE — Telephone Encounter (Signed)
Left msg for pt that the prescription has been sent to the pharmacy.

## 2022-02-04 ENCOUNTER — Other Ambulatory Visit: Payer: Self-pay | Admitting: Family Medicine

## 2022-02-04 DIAGNOSIS — F4001 Agoraphobia with panic disorder: Secondary | ICD-10-CM

## 2022-03-11 ENCOUNTER — Other Ambulatory Visit: Payer: Self-pay | Admitting: Family Medicine

## 2022-03-11 DIAGNOSIS — F4001 Agoraphobia with panic disorder: Secondary | ICD-10-CM

## 2022-03-17 ENCOUNTER — Other Ambulatory Visit: Payer: Self-pay | Admitting: Family Medicine

## 2022-03-17 ENCOUNTER — Encounter: Payer: Self-pay | Admitting: Family Medicine

## 2022-03-17 ENCOUNTER — Ambulatory Visit (INDEPENDENT_AMBULATORY_CARE_PROVIDER_SITE_OTHER): Payer: BC Managed Care – PPO | Admitting: Family Medicine

## 2022-03-17 VITALS — BP 134/85 | HR 87 | Ht 72.0 in | Wt 172.0 lb

## 2022-03-17 DIAGNOSIS — F4001 Agoraphobia with panic disorder: Secondary | ICD-10-CM | POA: Diagnosis not present

## 2022-03-17 DIAGNOSIS — I10 Essential (primary) hypertension: Secondary | ICD-10-CM | POA: Diagnosis not present

## 2022-03-17 DIAGNOSIS — E1169 Type 2 diabetes mellitus with other specified complication: Secondary | ICD-10-CM

## 2022-03-17 DIAGNOSIS — L709 Acne, unspecified: Secondary | ICD-10-CM | POA: Diagnosis not present

## 2022-03-17 LAB — POCT GLYCOSYLATED HEMOGLOBIN (HGB A1C): HbA1c, POC (controlled diabetic range): 5.3 % (ref 0.0–7.0)

## 2022-03-17 MED ORDER — ALTRENO 0.05 % EX LOTN: TOPICAL_LOTION | CUTANEOUS | 1 refills | Status: DC

## 2022-03-22 ENCOUNTER — Other Ambulatory Visit: Payer: Self-pay | Admitting: Family Medicine

## 2022-03-22 DIAGNOSIS — L709 Acne, unspecified: Secondary | ICD-10-CM | POA: Insufficient documentation

## 2022-03-22 DIAGNOSIS — I2510 Atherosclerotic heart disease of native coronary artery without angina pectoris: Secondary | ICD-10-CM

## 2022-03-22 DIAGNOSIS — E785 Hyperlipidemia, unspecified: Secondary | ICD-10-CM

## 2022-03-22 NOTE — Assessment & Plan Note (Signed)
He continues on bupropion daily with clonazepam as needed.  Stable at this time with current medications.  Will plan to continue.

## 2022-03-22 NOTE — Progress Notes (Signed)
Jack [name] - 51 y.o. male MRN [id number]  Date of birth: November 04, 1970  Subjective Chief Complaint  Patient presents with   Hypertension   Diabetes    HPI Jack [name] is a 52 year old male here today for follow-up visit.  He reports he is doing fairly well at this time.  Continues on Mounjaro 12.5 mg weekly for management of diabetes.  He has done quite well with this with good control blood sugars as well as good weight loss.  He is tolerating this fairly well.  Anxiety is managed with combination of bupropion daily with clonazepam as needed.  He does have agoraphobia with panic.  He has not had any significant side effects from medications.  He was to have Altreno prescribed for acne.  He has not with acne for quite some time.  Has tried multiple medications and topical tretinoin has worked well for him in the past.  ROS:  A comprehensive ROS was completed and negative except as noted per HPI  Allergies  Allergen Reactions   Levaquin [Levofloxacin In D5w] Itching and Swelling   Edarbi [Azilsartan] Other (See Comments)    Depressed mood   Lisinopril Cough   Trazodone And Nefazodone Rash   Valsartan Rash    Past Medical History:  Diagnosis Date   Anxiety    Gout    High cholesterol    Hypertension    Stroke [facility])     Past Surgical History:  Procedure Laterality Date   NO PAST SURGERIES      Social History   Socioeconomic History   Marital status: Married    Spouse name: Not on file   Number of children: 0   Years of education: Some college   Highest education level: Not on file  Occupational History   Occupation: [employer]  Tobacco Use   Smoking status: Never   Smokeless tobacco: Never  Vaping Use   Vaping Use: Never used  Substance and Sexual Activity   Alcohol use: No   Drug use: No   Sexual activity: Yes  Other Topics Concern   Not on file  Social History Narrative   Lives w/ his partner   Right-handed   Caffeine: none   Social Determinants  of Health   Financial Resource Strain: Not on file  Food Insecurity: Not on file  Transportation Needs: Not on file  Physical Activity: Not on file  Stress: Not on file  Social Connections: Not on file    Family History  Adopted: Yes  Problem Relation Age of Onset   Stroke Neg Hx     Health Maintenance  Topic Date Due   OPHTHALMOLOGY EXAM  09/01/2018   COVID-19 Vaccine (5 - 2023-24 season) 04/02/2022 (Originally 11/14/2021)   Zoster Vaccines- Shingrix (1 of 2) 06/16/2022 (Originally 06/26/1988)   HEMOGLOBIN A1C  09/15/2022   Diabetic kidney evaluation - eGFR measurement  11/05/2022   Diabetic kidney evaluation - Urine ACR  11/05/2022   FOOT EXAM  03/18/2023   Fecal DNA (Cologuard)  02/21/2024   DTaP/Tdap/Td (2 - Td or Tdap) 11/04/2026   Hepatitis C Screening  Completed   HIV Screening  Completed   HPV VACCINES  Aged Out   INFLUENZA VACCINE  Discontinued     ----------------------------------------------------------------------------------------------------------------------------------------------------------------------------------------------------------------- Physical Exam BP 134/85 (BP Location: Left Arm, Patient Position: Sitting, Cuff Size: Normal)   Pulse 87   Ht 6' (1.829 m)   Wt 172 lb (78 kg)   SpO2 98%   BMI 23.33 kg/m  Physical Exam Constitutional:      Appearance: Normal appearance.  HENT:     Head: Normocephalic and atraumatic.  Eyes:     General: No scleral icterus. Neurological:     Mental Status: He is alert.  Psychiatric:        Mood and Affect: Mood normal.        Behavior: Behavior normal.     ------------------------------------------------------------------------------------------------------------------------------------------------------------------------------------------------------------------- Assessment and Plan  Hypertension goal BP (blood pressure) < 130/80 Blood pressure mains well-controlled.  Continue metoprolol at  current strength.  Type 2 diabetes mellitus with other specified complication [facility]) Lab Results  Component Value Date   HGBA1C 5.3 03/17/2022  Blood sugars remain well-controlled.  Recommend continuation of Mounjaro at current strength.  Agoraphobia with panic attacks He continues on bupropion daily with clonazepam as needed.  Stable at this time with current medications.  Will plan to continue.  Acne Adding Altreno, topical tretinoin.     Meds ordered this encounter  Medications   Tretinoin (ALTRENO) 0.05 % LOTN    Sig: Apply daily qhs    Dispense:  45 g    Refill:  1    Return in about 6 months (around 09/15/2022) for T2DM/Anxiety.    This visit occurred during the SARS-CoV-2 public health emergency.  Safety protocols were in place, including screening questions prior to the visit, additional usage of staff PPE, and extensive cleaning of exam room while observing appropriate contact time as indicated for disinfecting solutions.

## 2022-03-22 NOTE — Assessment & Plan Note (Signed)
Lab Results  Component Value Date   HGBA1C 5.3 03/17/2022  Blood sugars remain well-controlled.  Recommend continuation of Mounjaro at current strength.

## 2022-03-22 NOTE — Assessment & Plan Note (Signed)
Adding Altreno, topical tretinoin.

## 2022-03-22 NOTE — Assessment & Plan Note (Signed)
Blood pressure mains well-controlled.  Continue metoprolol at current strength.

## 2022-04-13 ENCOUNTER — Encounter: Payer: Self-pay | Admitting: Family Medicine

## 2022-04-13 MED ORDER — TIRZEPATIDE 15 MG/0.5ML ~~LOC~~ SOAJ: 15.0000 mg | SUBCUTANEOUS | 0 refills | Status: DC

## 2022-04-15 ENCOUNTER — Other Ambulatory Visit: Payer: Self-pay | Admitting: Medical-Surgical

## 2022-04-15 DIAGNOSIS — F4001 Agoraphobia with panic disorder: Secondary | ICD-10-CM

## 2022-04-26 ENCOUNTER — Other Ambulatory Visit: Payer: Self-pay | Admitting: Family Medicine

## 2022-04-26 DIAGNOSIS — F39 Unspecified mood [affective] disorder: Secondary | ICD-10-CM

## 2022-04-26 DIAGNOSIS — R519 Headache, unspecified: Secondary | ICD-10-CM | POA: Diagnosis not present

## 2022-04-26 DIAGNOSIS — M1A9XX0 Chronic gout, unspecified, without tophus (tophi): Secondary | ICD-10-CM

## 2022-04-26 DIAGNOSIS — I10 Essential (primary) hypertension: Secondary | ICD-10-CM

## 2022-04-26 DIAGNOSIS — H9319 Tinnitus, unspecified ear: Secondary | ICD-10-CM | POA: Diagnosis not present

## 2022-05-15 ENCOUNTER — Emergency Department (HOSPITAL_COMMUNITY): Payer: BC Managed Care – PPO

## 2022-05-15 ENCOUNTER — Encounter (HOSPITAL_COMMUNITY): Payer: Self-pay

## 2022-05-15 ENCOUNTER — Emergency Department (HOSPITAL_COMMUNITY)
Admission: EM | Admit: 2022-05-15 | Discharge: 2022-05-15 | Disposition: A | Discharge status: Home / Self Care | Payer: BC Managed Care – PPO | Attending: Emergency Medicine | Admitting: Emergency Medicine

## 2022-05-15 ENCOUNTER — Other Ambulatory Visit: Payer: Self-pay

## 2022-05-15 DIAGNOSIS — I10 Essential (primary) hypertension: Secondary | ICD-10-CM | POA: Insufficient documentation

## 2022-05-15 DIAGNOSIS — R079 Chest pain, unspecified: Secondary | ICD-10-CM | POA: Diagnosis not present

## 2022-05-15 DIAGNOSIS — R0789 Other chest pain: Secondary | ICD-10-CM | POA: Insufficient documentation

## 2022-05-15 DIAGNOSIS — Z7982 Long term (current) use of aspirin: Secondary | ICD-10-CM | POA: Diagnosis not present

## 2022-05-15 DIAGNOSIS — R519 Headache, unspecified: Secondary | ICD-10-CM | POA: Diagnosis not present

## 2022-05-15 DIAGNOSIS — R42 Dizziness and giddiness: Secondary | ICD-10-CM | POA: Diagnosis not present

## 2022-05-15 DIAGNOSIS — R000 Tachycardia, unspecified: Secondary | ICD-10-CM | POA: Diagnosis not present

## 2022-05-15 DIAGNOSIS — Z79899 Other long term (current) drug therapy: Secondary | ICD-10-CM | POA: Insufficient documentation

## 2022-05-15 DIAGNOSIS — Z8673 Personal history of transient ischemic attack (TIA), and cerebral infarction without residual deficits: Secondary | ICD-10-CM | POA: Diagnosis not present

## 2022-05-15 DIAGNOSIS — E119 Type 2 diabetes mellitus without complications: Secondary | ICD-10-CM | POA: Insufficient documentation

## 2022-05-15 DIAGNOSIS — Z1152 Encounter for screening for COVID-19: Secondary | ICD-10-CM | POA: Diagnosis not present

## 2022-05-15 LAB — BASIC METABOLIC PANEL
Anion gap: 9 (ref 5–15)
BUN: 7 mg/dL (ref 6–20)
CO2: 22 mmol/L (ref 22–32)
Calcium: 9.3 mg/dL (ref 8.9–10.3)
Chloride: 106 mmol/L (ref 98–111)
Creatinine, Ser: 0.81 mg/dL (ref 0.61–1.24)
GFR, Estimated: >60 mL/min (ref >60)
Glucose, Bld: 110 mg/dL — ABNORMAL HIGH (ref 70–99)
Potassium: 4.2 mmol/L (ref 3.5–5.1)
Sodium: 137 mmol/L (ref 135–145)

## 2022-05-15 LAB — CBC
HCT: 46.7 % (ref 39.0–52.0)
Hemoglobin: 15.1 g/dL (ref 13.0–17.0)
MCH: 30.5 pg (ref 26.0–34.0)
MCHC: 32.3 g/dL (ref 30.0–36.0)
MCV: 94.3 fL (ref 80.0–100.0)
Platelets: 161 10*3/uL (ref 150–400)
RBC: 4.95 MIL/uL (ref 4.22–5.81)
RDW: 13.4 % (ref 11.5–15.5)
WBC: 11.1 10*3/uL — ABNORMAL HIGH (ref 4.0–10.5)
nRBC: 0 % (ref 0.0–0.2)

## 2022-05-15 LAB — TROPONIN I (HIGH SENSITIVITY)
Troponin I (High Sensitivity): 8 ng/L (ref <18)
Troponin I (High Sensitivity): 3 ng/L (ref <18)

## 2022-05-15 LAB — RESP PANEL BY RT-PCR (RSV, FLU A&B, COVID)  RVPGX2
Influenza A by PCR: NEGATIVE
Influenza B by PCR: NEGATIVE
Resp Syncytial Virus by PCR: NEGATIVE
SARS Coronavirus 2 by RT PCR: NEGATIVE

## 2022-05-15 MED ORDER — METOCLOPRAMIDE HCL 5 MG/ML IJ SOLN
10.0000 mg | Freq: Once | INTRAMUSCULAR | Status: AC
  Administered 2022-05-15: 10 mg via INTRAVENOUS
  Filled 2022-05-15: qty 2

## 2022-05-15 MED ORDER — METOPROLOL TARTRATE 5 MG/5ML IV SOLN
5.0000 mg | Freq: Once | INTRAVENOUS | Status: AC
  Administered 2022-05-15: 5 mg via INTRAVENOUS
  Filled 2022-05-15: qty 5

## 2022-05-15 MED ORDER — DIPHENHYDRAMINE HCL 50 MG/ML IJ SOLN
12.5000 mg | Freq: Once | INTRAMUSCULAR | Status: AC
  Administered 2022-05-15: 12.5 mg via INTRAVENOUS
  Filled 2022-05-15: qty 1

## 2022-05-15 MED ORDER — METOPROLOL TARTRATE 25 MG PO TABS: 50.0000 mg | ORAL_TABLET | Freq: Two times a day (BID) | ORAL | 0 refills | Status: DC

## 2022-05-15 MED ORDER — KETOROLAC TROMETHAMINE 15 MG/ML IJ SOLN
15.0000 mg | Freq: Once | INTRAMUSCULAR | Status: AC
  Administered 2022-05-15: 15 mg via INTRAVENOUS
  Filled 2022-05-15: qty 1

## 2022-05-15 MED ORDER — SODIUM CHLORIDE 0.9 % IV BOLUS
1000.0000 mL | Freq: Once | INTRAVENOUS | Status: AC
  Administered 2022-05-15: 1000 mL via INTRAVENOUS

## 2022-05-15 NOTE — Discharge Instructions (Signed)
You were seen in the emergency department for headache visual symptoms chest pain.  You had blood work EKG head CT chest x-ray that did not show any definite explanation for your symptoms.  Your heart rate was elevated and we are restarting you on your metoprolol twice a day.  Please follow-up with your primary care doctor.  Return to the emergency department if any worsening or concerning symptoms.

## 2022-05-15 NOTE — ED Provider Notes (Signed)
[facility] Provider Note   CSN: [id number] Arrival date & time: 05/15/22  1731     History  Chief Complaint  Patient presents with   Chest Pain    Jack [name] is a 52 y.o. male.  With a history of anxiety, hypertension, previous stroke, emphysema, diabetes who presents to the ED for evaluation of intermittent headaches and chest pains.  Symptoms began 2 weeks ago.  The have gotten worse over the past 2 days.  He initially presented to urgent care 2 weeks ago and was diagnosed with a sinus infection and started on antibiotics.  States this did not change his symptoms.  He reports the headache is localized to the middle of his forehead with radiation to bilateral ears and across his face and jaw.  These headaches last approximately 30 minutes each time and occur multiple times throughout the day.  He has not taken anything for the symptoms.  He states that when he has these headaches he develops some lightheadedness and vision changes when he changes positions.  The symptoms resolve when his headache resolves.  Does report history of CVA in the past with right-sided paralysis which ended up resolving spontaneously.  States the symptoms do not feel like when he had a CVA in the past. he also reports a chest tightness that he feels with deep inspiration.  Specifically denies the sensation as feeling like pain.  He denies shortness of breath, cough, hemoptysis, history of DVT or PE, unilateral leg swelling, recent travel, recent surgery or cancer treatment, estrogen use, fevers or chills.  States that his symptoms completely resolved while in the ambulance en route.  States he has had similar symptoms in the past and was told that they were migraines. He stopped taking all antihypertensives including metoprolol approximately 1 month ago.   Chest Pain Associated symptoms: headache        Home Medications Prior to Admission medications    Medication Sig Start Date End Date Taking? Authorizing Provider  tirzepatide [facility]) 15 MG/0.5ML Pen Inject 15 mg into the skin once a week. 04/13/22   Hali Marry, MD  allopurinol (ZYLOPRIM) 300 MG tablet TAKE 1 TABLET BY MOUTH EVERY DAY 04/27/22   Luetta Nutting, DO  aspirin 325 MG tablet Take 1 tablet (325 mg total) by mouth daily. 07/05/16   Rinehuls, David L, PA-C  atorvastatin (LIPITOR) 40 MG tablet TAKE 2 TABLETS (80 MG TOTAL) BY MOUTH AT BEDTIME. 03/24/22   Luetta Nutting, DO  buPROPion (WELLBUTRIN XL) 150 MG 24 hr tablet TAKE 1 TABLET BY MOUTH EVERY DAY 04/27/22   Luetta Nutting, DO  clonazePAM (KLONOPIN) 1 MG tablet TAKE 1-1.5 TABLETS (1-1.5 MG TOTAL) BY MOUTH 2 (TWO) TIMES DAILY. MUST LAST 30 DAYS 04/16/22   Luetta Nutting, DO  metoprolol (TOPROL-XL) 200 MG 24 hr tablet TAKE 1 TABLET BY MOUTH EVERY DAY 04/27/22   Luetta Nutting, DO  Tretinoin (ALTRENO) 0.05 % LOTN Apply daily qhs 03/17/22   Luetta Nutting, DO      Allergies    Levaquin [levofloxacin in d5w], Edarbi [azilsartan], Lisinopril, Trazodone and nefazodone, and Valsartan    Review of Systems   Review of Systems  Cardiovascular:  Positive for chest pain.  Neurological:  Positive for light-headedness and headaches.  All other systems reviewed and are negative.   Physical Exam Updated Vital Signs BP (!) 157/109   Pulse (!) 118   Temp 98 F (36.7 C) (Oral)   Resp  19   Ht 6' (1.829 m)   Wt 78 kg   SpO2 100%   BMI 23.33 kg/m  Physical Exam Vitals and nursing note reviewed.  Constitutional:      General: He is not in acute distress.    Appearance: He is well-developed. He is not ill-appearing, toxic-appearing or diaphoretic.     Comments: Resting comfortably in bed  HENT:     Head: Normocephalic and atraumatic.  Eyes:     Extraocular Movements: Extraocular movements intact.     Conjunctiva/sclera: Conjunctivae normal.     Pupils: Pupils are equal, round, and reactive to light.     Comments: No nystagmus   Neck:     Vascular: No JVD.     Comments: Negative Kernig's and Brudzinski's sign Cardiovascular:     Rate and Rhythm: Regular rhythm. Tachycardia present.     Pulses:          Radial pulses are 2+ on the right side and 2+ on the left side.       Dorsalis pedis pulses are 2+ on the right side and 2+ on the left side.     Heart sounds: No murmur heard. Pulmonary:     Effort: Pulmonary effort is normal. No respiratory distress.     Breath sounds: Normal breath sounds. No decreased breath sounds, wheezing, rhonchi or rales.  Abdominal:     Palpations: Abdomen is soft.     Tenderness: There is no abdominal tenderness.  Musculoskeletal:        General: No swelling. Normal range of motion.     Cervical back: Normal range of motion and neck supple.     Right lower leg: No edema.     Left lower leg: No edema.  Skin:    General: Skin is warm and dry.     Capillary Refill: Capillary refill takes less than 2 seconds.  Neurological:     General: No focal deficit present.     Mental Status: He is alert and oriented to person, place, and time.     Comments: No facial asymmetry, slurred speech, unilateral or global weakness, pronator drift.  Normal finger-nose, heel-to-shin and shoulder shrug.  Sensation intact in all extremities  Psychiatric:        Mood and Affect: Mood is anxious.     ED Results / Procedures / Treatments   Labs (all labs ordered are listed, but only abnormal results are displayed) Labs Reviewed  CBC - Abnormal; Notable for the following components:      Result Value   WBC 11.1 (*)    All other components within normal limits  RESP PANEL BY RT-PCR (RSV, FLU A&B, COVID)  [id number]  BASIC METABOLIC PANEL  TROPONIN I (HIGH SENSITIVITY)    EKG EKG Interpretation  Date/Time:  Friday May 15 2022 17:35:37 EST Ventricular Rate:  127 PR Interval:  139 QRS Duration: 76 QT Interval:  295 QTC Calculation: 429 R Axis:   57 Text Interpretation: Sinus tachycardia  Ventricular premature complex Consider RVH or posterior infarct Probable anteroseptal infarct, old increased rate from prior 4/18 Confirmed by Aletta Edouard [phone]) on 05/15/2022 5:36:54 PM  Radiology DG Chest 2 View  Result Date: 05/15/2022 CLINICAL DATA:  Chest pain EXAM: CHEST - 2 VIEW COMPARISON:  07/26/2017 FINDINGS: The heart size and mediastinal contours are within normal limits. Both lungs are clear. The visualized skeletal structures are unremarkable. IMPRESSION: No active cardiopulmonary disease. Electronically Signed   By: Prudy Feeler.D.  On: 05/15/2022 18:03    Procedures Procedures    Medications Ordered in ED Medications  metoprolol tartrate (LOPRESSOR) injection 5 mg (has no administration in time range)  ketorolac (TORADOL) 15 MG/ML injection 15 mg (15 mg Intravenous Given 05/15/22 1815)  metoCLOPramide (REGLAN) injection 10 mg (10 mg Intravenous Given 05/15/22 1815)  sodium chloride 0.9 % bolus 1,000 mL (1,000 mLs Intravenous New Bag/Given 05/15/22 1813)  diphenhydrAMINE (BENADRYL) injection 12.5 mg (12.5 mg Intravenous Given 05/15/22 1814)    ED Course/ Medical Decision Making/ A&P Clinical Course as of 05/15/22 1859  Fri May 15, 2022  1830 Pulse Rate(!): 129 Patient reports typical heart rate of 100. Chart review of previous year shows typical reading between 57 and 90 [AS]    Clinical Course User Index [AS] Jamariyah Johannsen, Grafton Folk, PA-C                             Medical Decision Making Amount and/or Complexity of Data Reviewed Labs: ordered. Radiology: ordered.  Risk Prescription drug management.  This patient presents to the ED for concern of headache, chest pain, this involves an extensive number of treatment options, and is a complaint that carries with it a high risk of complications and morbidity. Emergent considerations for headache include subarachnoid hemorrhage, meningitis, temporal arteritis, glaucoma, cerebral ischemia, carotid/vertebral  dissection, intracranial tumor, Venous sinus thrombosis, carbon monoxide poisoning, acute or chronic subdural hemorrhage.  Other considerations include: Migraine, Cluster headache, Hypertension, Caffeine, alcohol, or drug withdrawal, Pseudotumor cerebri, Arteriovenous malformation, Head injury, Neurocysticercosis, Post-lumbar puncture, Preeclampsia, Tension headache, Sinusitis, Cervical arthritis, Refractive error causing strain, Dental abscess, Otitis media, Temporomandibular joint syndrome, Depression, Somatoform disorder (eg, somatization) Trigeminal neuralgia, Glossopharyngeal neuralgia.  Co morbidities that complicate the patient evaluation  anxiety, hypertension, previous stroke, emphysema, diabetes  My initial workup includes ACS rule out, respiratory panel, CT head, headache cocktail  Additional history obtained from: Nursing notes from this visit. EMS provides a portion of the history  I ordered, reviewed and interpreted labs which include: BMP, CBC, troponin, respiratory panel  I ordered imaging studies including chest x-ray, CT head I independently visualized and interpreted imaging which showed no acute abnormalities I agree with the radiologist interpretation  Cardiac Monitoring:  The patient was maintained on a cardiac monitor.  I personally viewed and interpreted the cardiac monitored which showed an underlying rhythm of: Initially sinus tachycardia  Afebrile, initially tachycardic to 130 beats per minutes and hypertensive.  52 year old male presents the ED for evaluation of persistent intermittent headaches over the past 2 weeks and some chest tightness with deep inspiration.  He appears anxious on exam but is otherwise overall very well-appearing.  No neurologic deficits.  Negative Kernig and Brudzinski's sign. Low suspicion for meningitis as the cause of his symptoms.  Workup for ACS rule out and CT head ordered as well as headache cocktail.  He reported improvement in his  symptoms after medications.  He has a Wells score for PE of 1.5 due to tachycardia but denies shortness of breath.  He is not hypoxic.  I have low suspicion for PE as a cause of his symptoms.  Tachycardia and hypertension may be caused by patient not taking his metoprolol anymore.  Care will be handed off to Dr. Melina Copa pending workup.  Current plan is to evaluate patient after headache cocktail, CT imaging and lab review.  If all is normal, patient will likely be safe for discharge home with appropriate follow-up.  Plan may change at the discretion of the oncoming provider.  Please see his note for final disposition and decision making.  Note: Portions of this report may have been transcribed using voice recognition software. Every effort was made to ensure accuracy; however, inadvertent computerized transcription errors may still be present.        Final Clinical Impression(s) / ED Diagnoses Final diagnoses:  None    Rx / DC Orders ED Discharge Orders     None         Roylene Reason, Hershal Coria 05/15/22 1859    Hayden Rasmussen, MD 05/16/22 1028

## 2022-05-15 NOTE — ED Triage Notes (Signed)
PER EMS: pt is from home with c/o "a silent migraine" that started 2 weeks ago where he has been seeing a kaleidoscope of colors as well as pressure that started in his jaw, neck and between his eyes. Denies blurry vision. Hx of this 6 years ago which ended up leading to a stroke causing right sided paralysis which has since resolved. Today, he is reporting chest pain and dizziness when standing. Denies n/v/d  BP-140/90, HR-120, O2-98%  CBG-122

## 2022-05-15 NOTE — ED Notes (Signed)
Pt reports he has taken '324mg'$  of aspirin today.

## 2022-05-21 ENCOUNTER — Other Ambulatory Visit: Payer: Self-pay | Admitting: Family Medicine

## 2022-05-21 DIAGNOSIS — F4001 Agoraphobia with panic disorder: Secondary | ICD-10-CM

## 2022-05-22 NOTE — Telephone Encounter (Signed)
Last OV: 03/17/22 Next OV: 09/21/22 Last RF: 04/16/22

## 2022-05-29 DIAGNOSIS — Z113 Encounter for screening for infections with a predominantly sexual mode of transmission: Secondary | ICD-10-CM | POA: Diagnosis not present

## 2022-05-29 DIAGNOSIS — R300 Dysuria: Secondary | ICD-10-CM | POA: Diagnosis not present

## 2022-05-29 DIAGNOSIS — R8271 Bacteriuria: Secondary | ICD-10-CM | POA: Diagnosis not present

## 2022-06-18 DIAGNOSIS — R300 Dysuria: Secondary | ICD-10-CM | POA: Diagnosis not present

## 2022-06-18 DIAGNOSIS — R3915 Urgency of urination: Secondary | ICD-10-CM | POA: Diagnosis not present

## 2022-06-18 DIAGNOSIS — R350 Frequency of micturition: Secondary | ICD-10-CM | POA: Diagnosis not present

## 2022-06-29 ENCOUNTER — Other Ambulatory Visit: Payer: Self-pay | Admitting: Family Medicine

## 2022-06-29 DIAGNOSIS — F4001 Agoraphobia with panic disorder: Secondary | ICD-10-CM

## 2022-07-24 DIAGNOSIS — R350 Frequency of micturition: Secondary | ICD-10-CM | POA: Diagnosis not present

## 2022-07-24 DIAGNOSIS — D414 Neoplasm of uncertain behavior of bladder: Secondary | ICD-10-CM | POA: Diagnosis not present

## 2022-07-24 DIAGNOSIS — R300 Dysuria: Secondary | ICD-10-CM | POA: Diagnosis not present

## 2022-07-30 ENCOUNTER — Other Ambulatory Visit: Payer: Self-pay | Admitting: Family Medicine

## 2022-07-30 DIAGNOSIS — F4001 Agoraphobia with panic disorder: Secondary | ICD-10-CM

## 2022-08-22 ENCOUNTER — Other Ambulatory Visit: Payer: Self-pay | Admitting: Family Medicine

## 2022-08-31 ENCOUNTER — Other Ambulatory Visit: Payer: Self-pay | Admitting: Family Medicine

## 2022-08-31 DIAGNOSIS — F4001 Agoraphobia with panic disorder: Secondary | ICD-10-CM

## 2022-09-15 ENCOUNTER — Ambulatory Visit: Payer: BC Managed Care – PPO | Admitting: Family Medicine

## 2022-09-16 DIAGNOSIS — G44099 Other trigeminal autonomic cephalgias (TAC), not intractable: Secondary | ICD-10-CM | POA: Diagnosis not present

## 2022-09-16 DIAGNOSIS — R059 Cough, unspecified: Secondary | ICD-10-CM | POA: Diagnosis not present

## 2022-09-16 DIAGNOSIS — J40 Bronchitis, not specified as acute or chronic: Secondary | ICD-10-CM | POA: Diagnosis not present

## 2022-09-16 DIAGNOSIS — R0981 Nasal congestion: Secondary | ICD-10-CM | POA: Diagnosis not present

## 2022-09-21 ENCOUNTER — Encounter: Payer: Self-pay | Admitting: Family Medicine

## 2022-09-21 ENCOUNTER — Ambulatory Visit (INDEPENDENT_AMBULATORY_CARE_PROVIDER_SITE_OTHER): Payer: BC Managed Care – PPO | Admitting: Family Medicine

## 2022-09-21 VITALS — BP 114/81 | HR 100 | Ht 72.0 in | Wt 170.0 lb

## 2022-09-21 DIAGNOSIS — F4001 Agoraphobia with panic disorder: Secondary | ICD-10-CM

## 2022-09-21 DIAGNOSIS — E1169 Type 2 diabetes mellitus with other specified complication: Secondary | ICD-10-CM | POA: Diagnosis not present

## 2022-09-21 DIAGNOSIS — Z7985 Long-term (current) use of injectable non-insulin antidiabetic drugs: Secondary | ICD-10-CM | POA: Diagnosis not present

## 2022-09-21 DIAGNOSIS — I10 Essential (primary) hypertension: Secondary | ICD-10-CM

## 2022-09-21 LAB — POCT GLYCOSYLATED HEMOGLOBIN (HGB A1C): HbA1c, POC (controlled diabetic range): 5.4 % (ref 0.0–7.0)

## 2022-09-21 MED ORDER — AMLODIPINE BESYLATE 2.5 MG PO TABS: 2.5000 mg | ORAL_TABLET | Freq: Every day | ORAL | 1 refills | Status: DC

## 2022-09-21 NOTE — Assessment & Plan Note (Signed)
Diabetes remains well-controlled.  Continue Mounjaro at current strength of 15 mg weekly.

## 2022-09-21 NOTE — Assessment & Plan Note (Signed)
Blood pressure fairly well-controlled.  Felt better with amlodipine and will add this back on 2.5 mg daily.

## 2022-09-21 NOTE — Progress Notes (Signed)
Jack [name] - 52 y.o. male MRN [id number]  Date of birth: Jul 28, 1970  Subjective Chief Complaint  Patient presents with   Diabetes   Hypertension    HPI Jack [name] is a 52 year old male here today for follow-up visit.  He reports he is doing pretty well.  Continues on Luna for management of diabetes.  This is worked remarkably well for him.  He has not noted any significant side effects at this time.  Weight is remained stable.  A1c has remained stable.  Blood pressure mains pretty well-controlled with metoprolol.  He was on amlodipine previously as well but had increased edema with this.  He did feel better when taking this and would like to add this back on.  Anxiety with panic attacks remain pretty well-controlled with bupropion daily and clonazepam as needed.  No significant side effects with medication at current strength.  ROS:  A comprehensive ROS was completed and negative except as noted per HPI  Allergies  Allergen Reactions   Levaquin [Levofloxacin In D5w] Itching and Swelling   Edarbi [Azilsartan] Other (See Comments)    Depressed mood   Lisinopril Cough   Trazodone And Nefazodone Rash   Valsartan Rash    Past Medical History:  Diagnosis Date   Anxiety    Gout    High cholesterol    Hypertension    Stroke [facility])     Past Surgical History:  Procedure Laterality Date   NO PAST SURGERIES      Social History   Socioeconomic History   Marital status: Married    Spouse name: Not on file   Number of children: 0   Years of education: Some college   Highest education level: Not on file  Occupational History   Occupation: Jack [name]  Tobacco Use   Smoking status: Never   Smokeless tobacco: Never  Vaping Use   Vaping Use: Never used  Substance and Sexual Activity   Alcohol use: No   Drug use: No   Sexual activity: Yes  Other Topics Concern   Not on file  Social History Narrative   Lives w/ his partner   Right-handed   Caffeine: none   Social  Determinants of Health   Financial Resource Strain: Not on file  Food Insecurity: Not on file  Transportation Needs: Not on file  Physical Activity: Not on file  Stress: Not on file  Social Connections: Not on file    Family History  Adopted: Yes  Problem Relation Age of Onset   Stroke Neg Hx     Health Maintenance  Topic Date Due   Zoster Vaccines- Shingrix (1 of 2) Never done   OPHTHALMOLOGY EXAM  09/01/2018   COVID-19 Vaccine (5 - 2023-24 season) 11/14/2021   INFLUENZA VACCINE  10/15/2022   Diabetic kidney evaluation - Urine ACR  11/05/2022   FOOT EXAM  03/18/2023   HEMOGLOBIN A1C  03/24/2023   Diabetic kidney evaluation - eGFR measurement  05/15/2023   Fecal DNA (Cologuard)  02/21/2024   DTaP/Tdap/Td (2 - Td or Tdap) 11/04/2026   Hepatitis C Screening  Completed   HIV Screening  Completed   HPV VACCINES  Aged Out     ----------------------------------------------------------------------------------------------------------------------------------------------------------------------------------------------------------------- Physical Exam There were no vitals taken for this visit.  Physical Exam Constitutional:      Appearance: Normal appearance.  HENT:     Head: Normocephalic and atraumatic.  Eyes:     General: No scleral icterus. Cardiovascular:     Rate and Rhythm:  Normal rate and regular rhythm.  Pulmonary:     Effort: Pulmonary effort is normal.     Breath sounds: Normal breath sounds.  Neurological:     General: No focal deficit present.     Mental Status: He is alert.  Psychiatric:        Mood and Affect: Mood normal.        Behavior: Behavior normal.     ------------------------------------------------------------------------------------------------------------------------------------------------------------------------------------------------------------------- Assessment and Plan  Hypertension goal BP (blood pressure) < 130/80 Blood pressure  fairly well-controlled.  Felt better with amlodipine and will add this back on 2.5 mg daily.  Type 2 diabetes mellitus with other specified complication (HCC) Diabetes remains well-controlled.  Continue Mounjaro at current strength of 15 mg weekly.  Agoraphobia with panic attacks Will plan to continue bupropion with clonazepam as needed.  No signs of abuse or overuse.   Meds ordered this encounter  Medications   amLODipine (NORVASC) 2.5 MG tablet    Sig: Take 1 tablet (2.5 mg total) by mouth daily.    Dispense:  90 tablet    Refill:  1    Return in about 6 months (around 03/24/2023) for T2DM.    This visit occurred during the SARS-CoV-2 public health emergency.  Safety protocols were in place, including screening questions prior to the visit, additional usage of staff PPE, and extensive cleaning of exam room while observing appropriate contact time as indicated for disinfecting solutions.

## 2022-09-21 NOTE — Assessment & Plan Note (Signed)
Will plan to continue bupropion with clonazepam as needed.  No signs of abuse or overuse.

## 2022-09-22 ENCOUNTER — Encounter: Payer: Self-pay | Admitting: Family Medicine

## 2022-09-24 ENCOUNTER — Other Ambulatory Visit: Payer: Self-pay | Admitting: Family Medicine

## 2022-09-24 DIAGNOSIS — E785 Hyperlipidemia, unspecified: Secondary | ICD-10-CM

## 2022-09-24 DIAGNOSIS — I2510 Atherosclerotic heart disease of native coronary artery without angina pectoris: Secondary | ICD-10-CM

## 2022-09-29 ENCOUNTER — Encounter: Payer: Self-pay | Admitting: Family Medicine

## 2022-09-29 MED ORDER — TIRZEPATIDE 12.5 MG/0.5ML ~~LOC~~ SOAJ: 12.5000 mg | SUBCUTANEOUS | 1 refills | Status: DC

## 2022-10-02 ENCOUNTER — Other Ambulatory Visit: Payer: Self-pay | Admitting: Family Medicine

## 2022-10-02 DIAGNOSIS — F4001 Agoraphobia with panic disorder: Secondary | ICD-10-CM

## 2022-10-23 ENCOUNTER — Other Ambulatory Visit: Payer: Self-pay | Admitting: Family Medicine

## 2022-10-23 DIAGNOSIS — M1A9XX0 Chronic gout, unspecified, without tophus (tophi): Secondary | ICD-10-CM

## 2022-10-23 DIAGNOSIS — F39 Unspecified mood [affective] disorder: Secondary | ICD-10-CM

## 2022-10-23 DIAGNOSIS — I10 Essential (primary) hypertension: Secondary | ICD-10-CM

## 2022-11-27 ENCOUNTER — Encounter: Payer: Self-pay | Admitting: Family Medicine

## 2022-11-27 DIAGNOSIS — R059 Cough, unspecified: Secondary | ICD-10-CM | POA: Diagnosis not present

## 2022-11-27 DIAGNOSIS — R0981 Nasal congestion: Secondary | ICD-10-CM | POA: Diagnosis not present

## 2022-12-03 ENCOUNTER — Ambulatory Visit: Payer: BC Managed Care – PPO

## 2022-12-03 ENCOUNTER — Encounter: Payer: Self-pay | Admitting: Family Medicine

## 2022-12-03 ENCOUNTER — Ambulatory Visit (INDEPENDENT_AMBULATORY_CARE_PROVIDER_SITE_OTHER): Payer: BC Managed Care – PPO | Admitting: Family Medicine

## 2022-12-03 VITALS — BP 95/68 | HR 89 | Ht 72.0 in | Wt 164.0 lb

## 2022-12-03 DIAGNOSIS — J189 Pneumonia, unspecified organism: Secondary | ICD-10-CM | POA: Diagnosis not present

## 2022-12-03 DIAGNOSIS — E1169 Type 2 diabetes mellitus with other specified complication: Secondary | ICD-10-CM

## 2022-12-03 DIAGNOSIS — R9431 Abnormal electrocardiogram [ECG] [EKG]: Secondary | ICD-10-CM | POA: Diagnosis not present

## 2022-12-03 DIAGNOSIS — J918 Pleural effusion in other conditions classified elsewhere: Secondary | ICD-10-CM

## 2022-12-03 DIAGNOSIS — J90 Pleural effusion, not elsewhere classified: Secondary | ICD-10-CM | POA: Diagnosis not present

## 2022-12-03 LAB — POCT UA - MICROALBUMIN
Creatinine, POC: 200 mg/dL
Microalbumin Ur, POC: 80 mg/L

## 2022-12-03 NOTE — Assessment & Plan Note (Signed)
He  has had some palpitations also with pleural effusion recently.  Effusion presumed to be related to pneumonia.  EKG showing bundle branch block.  Echo ordered.

## 2022-12-03 NOTE — Progress Notes (Signed)
Jack [name] - 52 y.o. male MRN [id number]  Date of birth: 04/10/1970  Subjective Chief Complaint  Patient presents with   Follow-up   Abnormal ECG   Tinnitus    HPI Jack [name] is a 52 y.o. male here today for follow up of recent urgent care visit.  Seen at [facility] urgent care.  He was told that he had pneumonia as well as some evidence of fluid in his lungs.  Started on doxycycline and prednisone.  He also had EKG completed showing bundle branch block.  Unfortunately I do not have copies of his Xray or EKG today.  He reports that he is feeling a little better today.  He has had fluttering sensation in left side of chest.  Some fatigue but denies dyspnea, fever or chills.   ROS:  A comprehensive ROS was completed and negative except as noted per HPI  Allergies  Allergen Reactions   Levaquin [Levofloxacin In D5w] Itching and Swelling   Edarbi [Azilsartan] Other (See Comments)    Depressed mood   Lisinopril Cough   Trazodone And Nefazodone Rash   Valsartan Rash    Past Medical History:  Diagnosis Date   Anxiety    Gout    High cholesterol    Hypertension    Stroke [facility])     Past Surgical History:  Procedure Laterality Date   NO PAST SURGERIES      Social History   Socioeconomic History   Marital status: Married    Spouse name: Not on file   Number of children: 0   Years of education: Some college   Highest education level: Not on file  Occupational History   Occupation: [employer]  Tobacco Use   Smoking status: Never   Smokeless tobacco: Never  Vaping Use   Vaping status: Never Used  Substance and Sexual Activity   Alcohol use: No   Drug use: No   Sexual activity: Yes  Other Topics Concern   Not on file  Social History Narrative   Lives w/ his partner   Right-handed   Caffeine: none   Social Determinants of Health   Financial Resource Strain: Not on file  Food Insecurity: Not on file  Transportation Needs: Not on file  Physical  Activity: Not on file  Stress: Not on file  Social Connections: Not on file    Family History  Adopted: Yes  Problem Relation Age of Onset   Stroke Neg Hx     Health Maintenance  Topic Date Due   Diabetic kidney evaluation - Urine ACR  11/05/2022   OPHTHALMOLOGY EXAM  12/23/2022 (Originally 09/01/2018)   Zoster Vaccines- Shingrix (1 of 2) 12/23/2022 (Originally 06/26/1988)   COVID-19 Vaccine (5 - 2023-24 season) 01/19/2023 (Originally 11/15/2022)   INFLUENZA VACCINE  06/14/2023 (Originally 10/15/2022)   FOOT EXAM  03/18/2023   HEMOGLOBIN A1C  03/24/2023   Diabetic kidney evaluation - eGFR measurement  05/15/2023   Fecal DNA (Cologuard)  02/21/2024   DTaP/Tdap/Td (2 - Td or Tdap) 11/04/2026   Hepatitis C Screening  Completed   HIV Screening  Completed   HPV VACCINES  Aged Out     ----------------------------------------------------------------------------------------------------------------------------------------------------------------------------------------------------------------- Physical Exam BP 95/68 (BP Location: Left Arm, Patient Position: Sitting, Cuff Size: Normal)   Pulse 89   Ht 6' (1.829 m)   Wt 164 lb (74.4 kg)   SpO2 99%   BMI 22.24 kg/m   Physical Exam Constitutional:      Appearance: Normal appearance.  Eyes:     General: No scleral icterus. Cardiovascular:     Rate and Rhythm: Normal rate and regular rhythm.  Pulmonary:     Effort: Pulmonary effort is normal.     Breath sounds: Normal breath sounds.  Neurological:     Mental Status: He is alert.  Psychiatric:        Mood and Affect: Mood normal.        Behavior: Behavior normal.     ------------------------------------------------------------------------------------------------------------------------------------------------------------------------------------------------------------------- Assessment and Plan  Pleural effusion associated with pulmonary infection Recent cxr with pneumonia.   Improving symptoms.  Complete doxycycline.  Repeat xray to be sure effusion is improving.    Abnormal EKG He  has had some palpitations also with pleural effusion recently.  Effusion presumed to be related to pneumonia.  EKG showing bundle branch block.  Echo ordered.    No orders of the defined types were placed in this encounter.   No follow-ups on file.    This visit occurred during the SARS-CoV-2 public health emergency.  Safety protocols were in place, including screening questions prior to the visit, additional usage of staff PPE, and extensive cleaning of exam room while observing appropriate contact time as indicated for disinfecting solutions.

## 2022-12-03 NOTE — Assessment & Plan Note (Signed)
Recent cxr with pneumonia.  Improving symptoms.  Complete doxycycline.  Repeat xray to be sure effusion is improving.

## 2022-12-04 LAB — CMP14+EGFR
ALT: 39 IU/L (ref 0–44)
AST: 41 IU/L — ABNORMAL HIGH (ref 0–40)
Albumin: 3.8 g/dL (ref 3.8–4.9)
Alkaline Phosphatase: 70 IU/L (ref 44–121)
BUN/Creatinine Ratio: 11 (ref 9–20)
BUN: 8 mg/dL (ref 6–24)
Bilirubin Total: 0.6 mg/dL (ref 0.0–1.2)
CO2: 26 mmol/L (ref 20–29)
Calcium: 9.2 mg/dL (ref 8.7–10.2)
Chloride: 102 mmol/L (ref 96–106)
Creatinine, Ser: 0.71 mg/dL — ABNORMAL LOW (ref 0.76–1.27)
Globulin, Total: 2.5 g/dL (ref 1.5–4.5)
Glucose: 99 mg/dL (ref 70–99)
Potassium: 5 mmol/L (ref 3.5–5.2)
Sodium: 140 mmol/L (ref 134–144)
Total Protein: 6.3 g/dL (ref 6.0–8.5)
eGFR: 110 mL/min/{1.73_m2} (ref >59)

## 2022-12-04 LAB — CBC WITH DIFFERENTIAL/PLATELET
Basophils Absolute: 0 10*3/uL (ref 0.0–0.2)
Basos: 0 %
EOS (ABSOLUTE): 0.1 10*3/uL (ref 0.0–0.4)
Eos: 2 %
Hematocrit: 42.4 % (ref 37.5–51.0)
Hemoglobin: 14 g/dL (ref 13.0–17.7)
Immature Grans (Abs): 0 10*3/uL (ref 0.0–0.1)
Immature Granulocytes: 0 %
Lymphocytes Absolute: 2.5 10*3/uL (ref 0.7–3.1)
Lymphs: 43 %
MCH: 29.9 pg (ref 26.6–33.0)
MCHC: 33 g/dL (ref 31.5–35.7)
MCV: 91 fL (ref 79–97)
Monocytes Absolute: 0.4 10*3/uL (ref 0.1–0.9)
Monocytes: 7 %
Neutrophils Absolute: 2.8 10*3/uL (ref 1.4–7.0)
Neutrophils: 48 %
Platelets: 325 10*3/uL (ref 150–450)
RBC: 4.68 x10E6/uL (ref 4.14–5.80)
RDW: 14.5 % (ref 11.6–15.4)
WBC: 5.9 10*3/uL (ref 3.4–10.8)

## 2022-12-09 ENCOUNTER — Other Ambulatory Visit: Payer: Self-pay | Admitting: Family Medicine

## 2022-12-09 ENCOUNTER — Telehealth: Payer: Self-pay | Admitting: Family Medicine

## 2022-12-09 DIAGNOSIS — F4001 Agoraphobia with panic disorder: Secondary | ICD-10-CM

## 2022-12-09 NOTE — Telephone Encounter (Signed)
Patient called wanting an update on echocardiogram referral. Patient has not been contacted to schedule.

## 2022-12-10 ENCOUNTER — Encounter: Payer: Self-pay | Admitting: Family Medicine

## 2023-01-12 ENCOUNTER — Ambulatory Visit (HOSPITAL_COMMUNITY)
Admission: EM | Admit: 2023-01-12 | Discharge: 2023-01-12 | Disposition: A | Discharge status: Home / Self Care | Payer: BC Managed Care – PPO | Attending: Family Medicine | Admitting: Family Medicine

## 2023-01-12 ENCOUNTER — Ambulatory Visit (INDEPENDENT_AMBULATORY_CARE_PROVIDER_SITE_OTHER): Payer: BC Managed Care – PPO

## 2023-01-12 ENCOUNTER — Ambulatory Visit (HOSPITAL_BASED_OUTPATIENT_CLINIC_OR_DEPARTMENT_OTHER): Payer: BC Managed Care – PPO

## 2023-01-12 ENCOUNTER — Encounter (HOSPITAL_COMMUNITY): Payer: Self-pay

## 2023-01-12 ENCOUNTER — Other Ambulatory Visit: Payer: Self-pay | Admitting: Family Medicine

## 2023-01-12 DIAGNOSIS — J918 Pleural effusion in other conditions classified elsewhere: Secondary | ICD-10-CM | POA: Diagnosis not present

## 2023-01-12 DIAGNOSIS — R079 Chest pain, unspecified: Secondary | ICD-10-CM

## 2023-01-12 DIAGNOSIS — J189 Pneumonia, unspecified organism: Secondary | ICD-10-CM | POA: Diagnosis not present

## 2023-01-12 DIAGNOSIS — R9431 Abnormal electrocardiogram [ECG] [EKG]: Secondary | ICD-10-CM | POA: Diagnosis not present

## 2023-01-12 LAB — ECHOCARDIOGRAM COMPLETE
Area-P 1/2: 4.8 cm2
MV M vel: 4.16 m/s
MV Peak grad: 69.2 mm[Hg]
S' Lateral: 2.34 cm

## 2023-01-12 NOTE — ED Triage Notes (Addendum)
Patient reports that he was treated for pneumonia on 11/27/22. Patient states prior to that he was diagnosed with bronchitis and states he has been on 2 rounds of Doxycycline. Patient states while taking the second round of antibiotics his chest pain ended, but restarted 4 days ago. Patient just had an Echocardiogram today.

## 2023-01-12 NOTE — Discharge Instructions (Signed)
You have been seen at the [facility] today for chest pain. Your evaluation today was not suggestive of any emergent condition requiring medical intervention at this time. Your chest x-ray and ECG (heart tracing) did not show any worrisome changes. However, some medical problems make take more time to appear. Therefore, it's very important that you pay attention to any new symptoms or worsening of your current condition.  Please proceed directly to the Emergency Department immediately should you feel worse in any way or have any of the following symptoms: increasing or different chest pain, pain that spreads to your arm, neck, jaw, back or abdomen, shortness of breath, or nausea and vomiting.  

## 2023-01-12 NOTE — Progress Notes (Signed)
This patient complained of chest pain when he came in for his echo. I offered to take him to the ER he said he would go to the urgent care due to lower co-pay. Patient denied any shortness of breath or other symptoms. Patient stated chest has been constant over the last 3 days and feels different from recent pneumonia chest pain. Pain alleviated by Aleve. Patient stated pain is worse when he pushes on his chest. I did not see any contributing abnormalities on the echo. The patient has left to go to [facility] urgent care. I'm putting his echo in now.   Reported to Dr. Servando Salina

## 2023-01-13 NOTE — ED Provider Notes (Signed)
[facility]   [id number] 01/12/23 Arrival Time: 1416  ASSESSMENT & PLAN:  1. Chest pain, unspecified type    Patient history and exam consistent with non-cardiac cause of chest pain.  ECG: Performed today and interpreted by me: normal EKG, normal sinus rhythm. No STEMI.  I have personally viewed the imaging studies ordered this visit. CXR: No acute changes. No pneumothorax.  OTC ibuprofen which has been helping; likely MSK CP. Discussed.  Chest pain precautions given.    Discharge Instructions      You have been seen at the [facility] today for chest pain. Your evaluation today was not suggestive of any emergent condition requiring medical intervention at this time. Your chest x-ray and ECG (heart tracing) did not show any worrisome changes. However, some medical problems make take more time to appear. Therefore, it's very important that you pay attention to any new symptoms or worsening of your current condition.  Please proceed directly to the Emergency Department immediately should you feel worse in any way or have any of the following symptoms: increasing or different chest pain, pain that spreads to your arm, neck, jaw, back or abdomen, shortness of breath, or nausea and vomiting.      Reviewed expectations re: course of current medical issues. Questions answered. Outlined signs and symptoms indicating need for more acute intervention. Patient verbalized understanding. After Visit Summary given.   SUBJECTIVE:  History from: patient. Jack [name] is a 52 y.o. male who reports that he was treated for pneumonia on 11/27/22. Patient states prior to that he was diagnosed with bronchitis and states he has been on 2 rounds of Doxycycline. Patient states while taking the second round of antibiotics his chest pain ended, but restarted 4 days ago. Patient just had an Echocardiogram today. Declined ED evaluation for CP. Pain upper L chest; mostly  with certain torso movements. Denies SOB. Denies recreational drug use.  Social History   Tobacco Use  Smoking Status Never  Smokeless Tobacco Never   Social History   Substance and Sexual Activity  Alcohol Use No   OBJECTIVE:  Vitals:   01/12/23 1516  BP: (!) 129/90  Pulse: 87  Resp: 16  Temp: (!) 97.5 F (36.4 C)  TempSrc: Oral  SpO2: 97%    General appearance: alert, oriented, no acute distress Eyes: PERRLA; EOMI; conjunctivae normal HENT: normocephalic; atraumatic Neck: supple with FROM Lungs: without labored respirations; speaks full sentences without difficulty; CTAB Heart: regular rate and rhythm without murmer Chest Wall: reproducible CP over ULSB (same pain he describes in history) Abdomen: soft, non-tender; no guarding or rebound tenderness Extremities: without edema; without calf swelling or tenderness; symmetrical without gross deformities Skin: warm and dry; without rash or lesions Neuro: normal gait Psychological: alert and cooperative; normal mood and affect  Imaging: DG Chest 2 View  Result Date: 01/12/2023 CLINICAL DATA:  Recent pneumonia, chest pain EXAM: CHEST - 2 VIEW COMPARISON:  12/03/2022 FINDINGS: Frontal and lateral views of the chest demonstrate an unremarkable cardiac silhouette. No airspace disease, effusion, or pneumothorax. No acute bony abnormalities. IMPRESSION: 1. No acute intrathoracic process. Electronically Signed   By: Sharlet Salina M.D.   On: 01/12/2023 19:52   ECHOCARDIOGRAM COMPLETE  Result Date: 01/12/2023    ECHOCARDIOGRAM REPORT   Patient Name:   Jack [name] [facility] Date of Exam: 01/12/2023 Medical Rec #:  [id number]         Height:       72.0 in Accession #:  [id number]        Weight:       164.0 lb Date of Birth:  Oct 31, 1970         BSA:          1.958 m Patient Age:    52 years          BP:           122/94 mmHg Patient Gender: M                 HR:           80 bpm. Exam Location:  Outpatient Procedure: 2D Echo, 3D Echo,  Cardiac Doppler, Color Doppler and Strain Analysis Indications:    Abnormal EKG  History:        Patient has prior history of Echocardiogram examinations, most                 recent 07/02/2016. Abnormal ECG, Stroke; Risk                 Factors:Hypertension, Non-Smoker, Dyslipidemia and Diabetes.                 Pleural effusion associated with pulmonary infection; LVH;                 Pneumonia.  Sonographer:    Jeryl Columbia RDCS Referring Phys: [phone] CODY MATTHEWS  Sonographer Comments: This patient complained of chest pain when he came in for his echo. I offered to take him to the ER he said he would go to the urgent care due to lower co-pay. Patient denied any shortness of breath or other symptoms. Patient stated  chest has been constant over the last 3 days and feels different from recent pneumonia chest pain. Pain alleviated by Aleve. Patient stated pain is worse when he pushes on his chest. I did not see any contributing abnormalities on the echo. The patient has left to go to [facility] urgent care. IMPRESSIONS  1. Left ventricular ejection fraction, by estimation, is 45 to 50%. Left ventricular ejection fraction by 3D volume is 49 %. The left ventricle has mildly decreased function. The left ventricle has no regional wall motion abnormalities. Left ventricular  diastolic parameters are indeterminate. The average left ventricular global longitudinal strain is -17.6 %. The global longitudinal strain is normal.  2. Right ventricular systolic function is normal. The right ventricular size is mildly enlarged.  3. The mitral valve is normal in structure. Mild to moderate mitral valve regurgitation. No evidence of mitral stenosis.  4. The aortic valve is normal in structure. Aortic valve regurgitation is not visualized. No aortic stenosis is present.  5. The inferior vena cava is normal in size with greater than 50% respiratory variability, suggesting right atrial pressure of 3 mmHg. FINDINGS  Left Ventricle: Left  ventricular ejection fraction, by estimation, is 45 to 50%. Left ventricular ejection fraction by 3D volume is 49 %. The left ventricle has mildly decreased function. The left ventricle has no regional wall motion abnormalities. The  average left ventricular global longitudinal strain is -17.6 %. The global longitudinal strain is normal. The left ventricular internal cavity size was normal in size. There is no left ventricular hypertrophy. Left ventricular diastolic parameters are indeterminate. Right Ventricle: The right ventricular size is mildly enlarged. No increase in right ventricular wall thickness. Right ventricular systolic function is normal. Left Atrium: Left atrial size was normal in size. Right Atrium: Right atrial size was normal in size.  Pericardium: Trivial pericardial effusion is present. The pericardial effusion is circumferential. Presence of epicardial fat layer. Mitral Valve: The mitral valve is normal in structure. Mild to moderate mitral valve regurgitation. No evidence of mitral valve stenosis. Tricuspid Valve: The tricuspid valve is normal in structure. Tricuspid valve regurgitation is not demonstrated. No evidence of tricuspid stenosis. Aortic Valve: The aortic valve is normal in structure. Aortic valve regurgitation is not visualized. No aortic stenosis is present. Pulmonic Valve: The pulmonic valve was normal in structure. Pulmonic valve regurgitation is not visualized. No evidence of pulmonic stenosis. Aorta: The aortic root is normal in size and structure. Venous: The inferior vena cava is normal in size with greater than 50% respiratory variability, suggesting right atrial pressure of 3 mmHg. IAS/Shunts: No atrial level shunt detected by color flow Doppler.  LEFT VENTRICLE PLAX 2D LVIDd:         4.16 cm         Diastology LVIDs:         2.34 cm         LV e' medial:    8.70 cm/s LV PW:         1.47 cm         LV E/e' medial:  7.3 LV IVS:        1.14 cm         LV e' lateral:   11.30  cm/s LVOT diam:     2.10 cm         LV E/e' lateral: 5.6 LV SV:         48 LV SV Index:   25              2D LVOT Area:     3.46 cm        Longitudinal                                Strain                                2D Strain GLS  -17.6 %                                Avg:                                 3D Volume EF                                LV 3D EF:    Left                                             ventricul                                             ar  ejection                                             fraction                                             by 3D                                             volume is                                             49 %.                                 3D Volume EF:                                3D EF:        49 %                                LV EDV:       143 ml                                LV ESV:       73 ml                                LV SV:        70 ml RIGHT VENTRICLE RV Basal diam:  4.29 cm RV Mid diam:    3.45 cm RV S prime:     10.70 cm/s TAPSE (M-mode): 2.6 cm LEFT ATRIUM             Index        RIGHT ATRIUM           Index LA diam:        3.00 cm 1.53 cm/m   RA Area:     16.30 cm LA Vol (A2C):   29.5 ml 15.06 ml/m  RA Volume:   40.40 ml  20.63 ml/m LA Vol (A4C):   43.5 ml 22.21 ml/m LA Biplane Vol: 36.1 ml 18.43 ml/m  AORTIC VALVE LVOT Vmax:   73.40 cm/s LVOT Vmean:  47.200 cm/s LVOT VTI:    0.139 m  AORTA Ao Root diam: 2.90 cm Ao Asc diam:  2.90 cm MITRAL VALVE MV Area (PHT): 4.80 cm    SHUNTS MV Decel Time: 158 msec    Systemic VTI:  0.14 m MR Peak grad: 69.2 mmHg    Systemic Diam: 2.10 cm MR Vmax:      416.00 cm/s MV E velocity: 63.40 cm/s MV A velocity: 74.40 cm/s MV E/A ratio:  0.85 Kardie Tobb DO Electronically signed by Thomasene Ripple DO Signature Date/Time: 01/12/2023/2:20:12 PM    Final      Allergies  Allergen Reactions   Levaquin [Levofloxacin In D5w] Itching and Swelling    Edarbi [Azilsartan] Other (See Comments)    Depressed mood   Lisinopril Cough   Trazodone And Nefazodone Rash   Valsartan Rash    Past Medical History:  Diagnosis Date   Anxiety    Gout    High cholesterol    Hypertension    Stroke [facility])    Social History   Socioeconomic History   Marital status: Married    Spouse name: Not on file   Number of children: 0   Years of education: Some college   Highest education level: Not on file  Occupational History   Occupation: [employer]  Tobacco Use   Smoking status: Never   Smokeless tobacco: Never  Vaping Use   Vaping status: Never Used  Substance and Sexual Activity   Alcohol use: No   Drug use: No   Sexual activity: Yes  Other Topics Concern   Not on file  Social History Narrative   Lives w/ his partner   Right-handed   Caffeine: none   Social Determinants of Health   Financial Resource Strain: Not on file  Food Insecurity: Not on file  Transportation Needs: Not on file  Physical Activity: Not on file  Stress: Not on file  Social Connections: Not on file  Intimate Partner Violence: Not on file   Family History  Adopted: Yes  Problem Relation Age of Onset   Stroke Neg Hx    Past Surgical History:  Procedure Laterality Date   NO PAST SURGERIES        Mardella Layman, MD 01/13/23 1026

## 2023-01-21 ENCOUNTER — Other Ambulatory Visit: Payer: Self-pay | Admitting: Family Medicine

## 2023-01-21 ENCOUNTER — Other Ambulatory Visit: Payer: Self-pay

## 2023-01-21 MED ORDER — AMLODIPINE BESYLATE 2.5 MG PO TABS: 2.5000 mg | ORAL_TABLET | Freq: Every day | ORAL | 1 refills | Status: DC

## 2023-01-21 NOTE — Progress Notes (Signed)
Refilled amlodipine per Dr. Ashley Royalty.

## 2023-02-16 ENCOUNTER — Other Ambulatory Visit: Payer: Self-pay | Admitting: Family Medicine

## 2023-02-16 DIAGNOSIS — F4001 Agoraphobia with panic disorder: Secondary | ICD-10-CM

## 2023-02-20 DIAGNOSIS — H9209 Otalgia, unspecified ear: Secondary | ICD-10-CM | POA: Diagnosis not present

## 2023-02-20 DIAGNOSIS — R0981 Nasal congestion: Secondary | ICD-10-CM | POA: Diagnosis not present

## 2023-02-20 DIAGNOSIS — J0100 Acute maxillary sinusitis, unspecified: Secondary | ICD-10-CM | POA: Diagnosis not present

## 2023-02-20 DIAGNOSIS — R070 Pain in throat: Secondary | ICD-10-CM | POA: Diagnosis not present

## 2023-03-24 ENCOUNTER — Ambulatory Visit: Payer: BC Managed Care – PPO | Admitting: Family Medicine

## 2023-03-25 ENCOUNTER — Other Ambulatory Visit: Payer: Self-pay | Admitting: Family Medicine

## 2023-03-25 DIAGNOSIS — I2510 Atherosclerotic heart disease of native coronary artery without angina pectoris: Secondary | ICD-10-CM

## 2023-03-25 DIAGNOSIS — E785 Hyperlipidemia, unspecified: Secondary | ICD-10-CM

## 2023-04-01 ENCOUNTER — Encounter: Payer: Self-pay | Admitting: Family Medicine

## 2023-04-01 ENCOUNTER — Ambulatory Visit (INDEPENDENT_AMBULATORY_CARE_PROVIDER_SITE_OTHER): Payer: BC Managed Care – PPO | Admitting: Family Medicine

## 2023-04-01 VITALS — BP 112/75 | HR 80 | Ht 72.0 in | Wt 173.0 lb

## 2023-04-01 DIAGNOSIS — F4001 Agoraphobia with panic disorder: Secondary | ICD-10-CM | POA: Diagnosis not present

## 2023-04-01 DIAGNOSIS — I10 Essential (primary) hypertension: Secondary | ICD-10-CM | POA: Diagnosis not present

## 2023-04-01 DIAGNOSIS — E785 Hyperlipidemia, unspecified: Secondary | ICD-10-CM

## 2023-04-01 DIAGNOSIS — E1169 Type 2 diabetes mellitus with other specified complication: Secondary | ICD-10-CM

## 2023-04-01 DIAGNOSIS — Z7985 Long-term (current) use of injectable non-insulin antidiabetic drugs: Secondary | ICD-10-CM

## 2023-04-01 LAB — POCT GLYCOSYLATED HEMOGLOBIN (HGB A1C): HbA1c, POC (prediabetic range): 5.3 % — AB (ref 5.7–6.4)

## 2023-04-01 NOTE — Assessment & Plan Note (Signed)
Diabetes remains well-controlled.  Continue Mounjaro at current strength of 15 mg weekly.

## 2023-04-01 NOTE — Assessment & Plan Note (Signed)
Blood pressure fairly well-controlled.Stopped amlodipine.  Continue metoprolol at current strength.

## 2023-04-01 NOTE — Assessment & Plan Note (Signed)
 Tolerating atorvastatin well at current strength.  Recommend continuation.

## 2023-04-01 NOTE — Assessment & Plan Note (Signed)
Will plan to continue bupropion with clonazepam as needed.  No signs of abuse or overuse.

## 2023-04-01 NOTE — Progress Notes (Signed)
Jack [name] - 53 y.o. male MRN [id number]  Date of birth: 25-Jan-1971  Subjective Chief Complaint  Patient presents with   Medical Management of Chronic Issues    HPI Jack [name] is a 53 y.o. male here today for follow up visit.    He reports that he is doing well  He remains on mounjaro for management of diabetes.  He has done well with this. No significant side effects at this time.  Tolerating atorvastatin well at current strength for management of co-existing HLD.   BP is well controlled at this time. He is off of amlodipine but continues on metoprolol.   Mood is stable at this time with bupropion and clonazepam as needed.  No side effects at this time with current strength.    ROS:  A comprehensive ROS was completed and negative except as noted per HPI  Allergies  Allergen Reactions   Levaquin [Levofloxacin In D5w] Itching and Swelling   Edarbi [Azilsartan] Other (See Comments)    Depressed mood   Lisinopril Cough   Trazodone And Nefazodone Rash   Valsartan Rash    Past Medical History:  Diagnosis Date   Anxiety    Gout    High cholesterol    Hypertension    Stroke [facility])     Past Surgical History:  Procedure Laterality Date   NO PAST SURGERIES      Social History   Socioeconomic History   Marital status: Married    Spouse name: Not on file   Number of children: 0   Years of education: Some college   Highest education level: Not on file  Occupational History   Occupation: [employer]  Tobacco Use   Smoking status: Never   Smokeless tobacco: Never  Vaping Use   Vaping status: Never Used  Substance and Sexual Activity   Alcohol use: No   Drug use: No   Sexual activity: Yes  Other Topics Concern   Not on file  Social History Narrative   Lives w/ his partner   Right-handed   Caffeine: none   Social Drivers of Corporate investment banker Strain: Not on file  Food Insecurity: Not on file  Transportation Needs: Not on file  Physical  Activity: Not on file  Stress: Not on file  Social Connections: Not on file    Family History  Adopted: Yes  Problem Relation Age of Onset   Stroke Neg Hx     Health Maintenance  Topic Date Due   Pneumococcal Vaccine 28-92 Years old (1 of 2 - PCV) Never done   Zoster Vaccines- Shingrix (1 of 2) Never done   OPHTHALMOLOGY EXAM  09/01/2018   COVID-19 Vaccine (5 - 2024-25 season) 11/15/2022   HEMOGLOBIN A1C  03/24/2023   INFLUENZA VACCINE  06/14/2023 (Originally 10/15/2022)   Diabetic kidney evaluation - eGFR measurement  12/03/2023   Diabetic kidney evaluation - Urine ACR  12/03/2023   Fecal DNA (Cologuard)  02/21/2024   FOOT EXAM  03/31/2024   DTaP/Tdap/Td (2 - Td or Tdap) 11/04/2026   Hepatitis C Screening  Completed   HIV Screening  Completed   HPV VACCINES  Aged Out     ----------------------------------------------------------------------------------------------------------------------------------------------------------------------------------------------------------------- Physical Exam BP 112/75 (BP Location: Left Arm, Patient Position: Sitting, Cuff Size: Normal)   Pulse 80   Ht 6' (1.829 m)   Wt 173 lb (78.5 kg)   SpO2 97%   BMI 23.46 kg/m   Physical Exam Constitutional:  Appearance: Normal appearance.  HENT:     Head: Normocephalic and atraumatic.  Cardiovascular:     Rate and Rhythm: Normal rate and regular rhythm.  Pulmonary:     Effort: Pulmonary effort is normal.     Breath sounds: Normal breath sounds.  Neurological:     General: No focal deficit present.     Mental Status: He is alert.  Psychiatric:        Mood and Affect: Mood normal.        Behavior: Behavior normal.     ------------------------------------------------------------------------------------------------------------------------------------------------------------------------------------------------------------------- Assessment and Plan  Type 2 diabetes mellitus with other  specified complication (HCC) Diabetes remains well-controlled.  Continue Mounjaro at current strength of 15 mg weekly.  Hypertension goal BP (blood pressure) < 130/80 Blood pressure fairly well-controlled.Stopped amlodipine.  Continue metoprolol at current strength.   Hyperlipidemia LDL goal <70 Tolerating atorvastatin well at current strength.  Recommend continuation.  Agoraphobia with panic attacks Will plan to continue bupropion with clonazepam as needed.  No signs of abuse or overuse.   No orders of the defined types were placed in this encounter.   No follow-ups on file.    This visit occurred during the SARS-CoV-2 public health emergency.  Safety protocols were in place, including screening questions prior to the visit, additional usage of staff PPE, and extensive cleaning of exam room while observing appropriate contact time as indicated for disinfecting solutions.

## 2023-04-02 LAB — CMP14+EGFR
ALT: 54 [IU]/L — ABNORMAL HIGH (ref 0–44)
AST: 41 [IU]/L — ABNORMAL HIGH (ref 0–40)
Albumin: 4.1 g/dL (ref 3.8–4.9)
Alkaline Phosphatase: 58 [IU]/L (ref 44–121)
BUN/Creatinine Ratio: 13 (ref 9–20)
BUN: 10 mg/dL (ref 6–24)
Bilirubin Total: 0.5 mg/dL (ref 0.0–1.2)
CO2: 22 mmol/L (ref 20–29)
Calcium: 9.2 mg/dL (ref 8.7–10.2)
Chloride: 101 mmol/L (ref 96–106)
Creatinine, Ser: 0.78 mg/dL (ref 0.76–1.27)
Globulin, Total: 2.1 g/dL (ref 1.5–4.5)
Glucose: 91 mg/dL (ref 70–99)
Potassium: 4.6 mmol/L (ref 3.5–5.2)
Sodium: 138 mmol/L (ref 134–144)
Total Protein: 6.2 g/dL (ref 6.0–8.5)
eGFR: 107 mL/min/{1.73_m2} (ref >59)

## 2023-04-02 LAB — CBC WITH DIFFERENTIAL/PLATELET
Basophils Absolute: 0 10*3/uL (ref 0.0–0.2)
Basos: 0 %
EOS (ABSOLUTE): 0.2 10*3/uL (ref 0.0–0.4)
Eos: 3 %
Hematocrit: 44.9 % (ref 37.5–51.0)
Hemoglobin: 14.9 g/dL (ref 13.0–17.7)
Immature Grans (Abs): 0 10*3/uL (ref 0.0–0.1)
Immature Granulocytes: 0 %
Lymphocytes Absolute: 2.8 10*3/uL (ref 0.7–3.1)
Lymphs: 43 %
MCH: 30.8 pg (ref 26.6–33.0)
MCHC: 33.2 g/dL (ref 31.5–35.7)
MCV: 93 fL (ref 79–97)
Monocytes Absolute: 0.5 10*3/uL (ref 0.1–0.9)
Monocytes: 7 %
Neutrophils Absolute: 3 10*3/uL (ref 1.4–7.0)
Neutrophils: 47 %
Platelets: 189 10*3/uL (ref 150–450)
RBC: 4.83 x10E6/uL (ref 4.14–5.80)
RDW: 12.8 % (ref 11.6–15.4)
WBC: 6.4 10*3/uL (ref 3.4–10.8)

## 2023-04-02 LAB — LIPID PANEL WITH LDL/HDL RATIO
Cholesterol, Total: 103 mg/dL (ref 100–199)
HDL: 37 mg/dL — ABNORMAL LOW (ref >39)
LDL Chol Calc (NIH): 51 mg/dL (ref 0–99)
LDL/HDL Ratio: 1.4 {ratio} (ref 0.0–3.6)
Triglycerides: 70 mg/dL (ref 0–149)
VLDL Cholesterol Cal: 15 mg/dL (ref 5–40)

## 2023-04-13 ENCOUNTER — Other Ambulatory Visit: Payer: Self-pay | Admitting: Family Medicine

## 2023-04-13 DIAGNOSIS — F39 Unspecified mood [affective] disorder: Secondary | ICD-10-CM

## 2023-04-13 DIAGNOSIS — M1A9XX0 Chronic gout, unspecified, without tophus (tophi): Secondary | ICD-10-CM

## 2023-04-13 DIAGNOSIS — I10 Essential (primary) hypertension: Secondary | ICD-10-CM

## 2023-04-16 ENCOUNTER — Encounter: Payer: Self-pay | Admitting: Family Medicine

## 2023-04-30 ENCOUNTER — Other Ambulatory Visit: Payer: Self-pay | Admitting: Family Medicine

## 2023-04-30 DIAGNOSIS — F4001 Agoraphobia with panic disorder: Secondary | ICD-10-CM

## 2023-05-04 ENCOUNTER — Encounter: Payer: Self-pay | Admitting: Family Medicine

## 2023-05-04 DIAGNOSIS — F4001 Agoraphobia with panic disorder: Secondary | ICD-10-CM

## 2023-05-04 MED ORDER — CLONAZEPAM 1 MG PO TABS: ORAL_TABLET | ORAL | 1 refills | Status: DC

## 2023-05-04 NOTE — Telephone Encounter (Signed)
Last OV: 04/01/23 Next OV: 09/29/23 Last RF: 02/18/23

## 2023-05-11 ENCOUNTER — Other Ambulatory Visit: Payer: Self-pay | Admitting: Family Medicine

## 2023-07-14 ENCOUNTER — Other Ambulatory Visit: Payer: Self-pay | Admitting: Family Medicine

## 2023-07-14 DIAGNOSIS — F4001 Agoraphobia with panic disorder: Secondary | ICD-10-CM

## 2023-07-17 ENCOUNTER — Other Ambulatory Visit: Payer: Self-pay | Admitting: Family Medicine

## 2023-07-17 DIAGNOSIS — I10 Essential (primary) hypertension: Secondary | ICD-10-CM

## 2023-07-17 DIAGNOSIS — F39 Unspecified mood [affective] disorder: Secondary | ICD-10-CM

## 2023-07-17 DIAGNOSIS — M1A9XX0 Chronic gout, unspecified, without tophus (tophi): Secondary | ICD-10-CM

## 2023-07-17 DIAGNOSIS — I2510 Atherosclerotic heart disease of native coronary artery without angina pectoris: Secondary | ICD-10-CM

## 2023-07-17 DIAGNOSIS — E785 Hyperlipidemia, unspecified: Secondary | ICD-10-CM

## 2023-09-14 ENCOUNTER — Encounter: Payer: Self-pay | Admitting: Family Medicine

## 2023-09-14 MED ORDER — TIRZEPATIDE 12.5 MG/0.5ML ~~LOC~~ SOAJ: 12.5000 mg | SUBCUTANEOUS | 1 refills | Status: DC

## 2023-09-22 ENCOUNTER — Other Ambulatory Visit: Payer: Self-pay | Admitting: Family Medicine

## 2023-09-22 ENCOUNTER — Encounter: Payer: Self-pay | Admitting: Family Medicine

## 2023-09-22 ENCOUNTER — Ambulatory Visit (INDEPENDENT_AMBULATORY_CARE_PROVIDER_SITE_OTHER): Admitting: Family Medicine

## 2023-09-22 VITALS — BP 106/70 | HR 84 | Resp 20 | Ht 72.0 in | Wt 167.0 lb

## 2023-09-22 DIAGNOSIS — F4001 Agoraphobia with panic disorder: Secondary | ICD-10-CM

## 2023-09-22 DIAGNOSIS — E1169 Type 2 diabetes mellitus with other specified complication: Secondary | ICD-10-CM | POA: Diagnosis not present

## 2023-09-22 DIAGNOSIS — I10 Essential (primary) hypertension: Secondary | ICD-10-CM | POA: Diagnosis not present

## 2023-09-22 DIAGNOSIS — H9313 Tinnitus, bilateral: Secondary | ICD-10-CM | POA: Insufficient documentation

## 2023-09-22 DIAGNOSIS — Z7985 Long-term (current) use of injectable non-insulin antidiabetic drugs: Secondary | ICD-10-CM

## 2023-09-22 DIAGNOSIS — E785 Hyperlipidemia, unspecified: Secondary | ICD-10-CM

## 2023-09-22 DIAGNOSIS — R7401 Elevation of levels of liver transaminase levels: Secondary | ICD-10-CM | POA: Diagnosis not present

## 2023-09-22 LAB — POCT GLYCOSYLATED HEMOGLOBIN (HGB A1C): Hemoglobin A1C: 5.1 % (ref 4.0–5.6)

## 2023-09-22 MED ORDER — CLONAZEPAM 1 MG PO TABS: ORAL_TABLET | ORAL | 3 refills | Status: DC

## 2023-09-22 NOTE — Assessment & Plan Note (Signed)
 Referral to ENT

## 2023-09-22 NOTE — Progress Notes (Signed)
 Jack [name] - 53 y.o. male MRN [id number]  Date of birth: 11-Aug-1970  Subjective Chief Complaint  Patient presents with   Medical Management of Chronic Issues    Type 2 diabetes mellitus with other specified complication, without long-term current use of insulin (HCC)   Hypertension    HPI Jack [name] is a 53 y.o. male here today for follow up visit   He reports that he is doing pretty well.  He did back down on Mounjaro  as he was having some GI side effects.  Feels better with decreased strength.  Blood sugars remain well-controlled.   Does report some ringing in his ears at times.  Has not really noticed any diminished hearing has not really noticed any diminished hearing.  Denies fullness in his ears.  Anxiety is stable with current medications.  No side effects at this time.  ROS:  A comprehensive ROS was completed and negative except as noted per HPI  Allergies  Allergen Reactions   Levaquin [Levofloxacin In D5w] Itching and Swelling   Edarbi [Azilsartan] Other (See Comments)    Depressed mood   Lisinopril Cough   Trazodone And Nefazodone Rash   Valsartan Rash    Past Medical History:  Diagnosis Date   Anxiety    Gout    High cholesterol    Hypertension    Stroke [facility])     Past Surgical History:  Procedure Laterality Date   NO PAST SURGERIES      Social History   Socioeconomic History   Marital status: Married    Spouse name: Not on file   Number of children: 0   Years of education: Some college   Highest education level: Not on file  Occupational History   Occupation: [employer]  Tobacco Use   Smoking status: Never   Smokeless tobacco: Never  Vaping Use   Vaping status: Never Used  Substance and Sexual Activity   Alcohol use: No   Drug use: No   Sexual activity: Yes  Other Topics Concern   Not on file  Social History Narrative   Lives w/ his partner   Right-handed   Caffeine: none   Social Drivers of Research scientist (physical sciences) Strain: Not on file  Food Insecurity: Not on file  Transportation Needs: Not on file  Physical Activity: Not on file  Stress: Not on file  Social Connections: Not on file    Family History  Adopted: Yes  Problem Relation Age of Onset   Stroke Neg Hx     Health Maintenance  Topic Date Due   Hepatitis B Vaccines (1 of 3 - 19+ 3-dose series) Never done   Zoster Vaccines- Shingrix (1 of 2) Never done   OPHTHALMOLOGY EXAM  09/01/2018   COVID-19 Vaccine (5 - 2024-25 season) 11/15/2022   Pneumococcal Vaccine 78-33 Years old (1 of 2 - PCV) 03/31/2024 (Originally 06/26/1988)   INFLUENZA VACCINE  10/15/2023   Diabetic kidney evaluation - Urine ACR  12/03/2023   Fecal DNA (Cologuard)  02/21/2024   HEMOGLOBIN A1C  03/24/2024   Diabetic kidney evaluation - eGFR measurement  03/31/2024   FOOT EXAM  03/31/2024   DTaP/Tdap/Td (2 - Td or Tdap) 11/04/2026   Hepatitis C Screening  Completed   HIV Screening  Completed   HPV VACCINES  Aged Out   Meningococcal B Vaccine  Aged Out     ----------------------------------------------------------------------------------------------------------------------------------------------------------------------------------------------------------------- Physical Exam BP 106/70   Pulse 84   Resp 20  Ht 6' (1.829 m)   Wt 167 lb (75.8 kg)   SpO2 99%   BMI 22.65 kg/m   Physical Exam Constitutional:      Appearance: Normal appearance.  HENT:     Head: Normocephalic and atraumatic.  Eyes:     General: No scleral icterus. Cardiovascular:     Rate and Rhythm: Normal rate and regular rhythm.  Pulmonary:     Effort: Pulmonary effort is normal.     Breath sounds: Normal breath sounds.  Musculoskeletal:     Cervical back: Neck supple.  Neurological:     Mental Status: He is alert.  Psychiatric:        Mood and Affect: Mood normal.      ------------------------------------------------------------------------------------------------------------------------------------------------------------------------------------------------------------------- Assessment and Plan  Type 2 diabetes mellitus with other specified complication (HCC) Blood sugars are well-controlled.  Continue Mounjaro  at current strength of 10 mg weekly.  Agoraphobia with panic attacks Will plan to continue bupropion  with clonazepam  as needed.  No signs of abuse or overuse.  Hypertension goal BP (blood pressure) < 130/80 Blood pressure fairly well-controlled.Stopped amlodipine .  Continue metoprolol  at current strength.   Hyperlipidemia LDL goal <70 Tolerating atorvastatin  well at current strength.  Recommend continuation.  Tinnitus of both ears Referral to ENT.   Meds ordered this encounter  Medications   clonazePAM  (KLONOPIN ) 1 MG tablet    Sig: TAKE 1-1.5 TABLETS (1-1.5 MG TOTAL) BY MOUTH 2 (TWO) TIMES DAILY. MUST LAST 30 DAYS    Dispense:  70 tablet    Refill:  3    Not to exceed 4 additional fills before 10/31/2023    No follow-ups on file.

## 2023-09-22 NOTE — Assessment & Plan Note (Signed)
 Blood sugars are well-controlled.  Continue Mounjaro  at current strength of 10 mg weekly.

## 2023-09-22 NOTE — Assessment & Plan Note (Signed)
 Blood pressure fairly well-controlled.Stopped amlodipine.  Continue metoprolol at current strength.

## 2023-09-22 NOTE — Assessment & Plan Note (Signed)
Will plan to continue bupropion with clonazepam as needed.  No signs of abuse or overuse.

## 2023-09-22 NOTE — Assessment & Plan Note (Signed)
 Tolerating atorvastatin well at current strength.  Recommend continuation.

## 2023-09-23 LAB — HEPATIC FUNCTION PANEL
ALT: 32 IU/L (ref 0–44)
AST: 27 IU/L (ref 0–40)
Albumin: 3.9 g/dL (ref 3.8–4.9)
Alkaline Phosphatase: 63 IU/L (ref 44–121)
Bilirubin Total: 0.6 mg/dL (ref 0.0–1.2)
Bilirubin, Direct: 0.18 mg/dL (ref 0.00–0.40)
Total Protein: 6.5 g/dL (ref 6.0–8.5)

## 2023-09-24 ENCOUNTER — Ambulatory Visit: Payer: Self-pay | Admitting: Family Medicine

## 2023-09-29 ENCOUNTER — Ambulatory Visit: Payer: BC Managed Care – PPO | Admitting: Family Medicine

## 2023-11-18 ENCOUNTER — Encounter (INDEPENDENT_AMBULATORY_CARE_PROVIDER_SITE_OTHER): Payer: Self-pay | Admitting: Physician Assistant

## 2023-11-18 ENCOUNTER — Ambulatory Visit (INDEPENDENT_AMBULATORY_CARE_PROVIDER_SITE_OTHER): Admitting: Physician Assistant

## 2023-11-18 ENCOUNTER — Ambulatory Visit (INDEPENDENT_AMBULATORY_CARE_PROVIDER_SITE_OTHER): Admitting: Audiology

## 2023-11-18 VITALS — BP 113/79 | HR 76

## 2023-11-18 DIAGNOSIS — H9313 Tinnitus, bilateral: Secondary | ICD-10-CM

## 2023-11-18 DIAGNOSIS — H903 Sensorineural hearing loss, bilateral: Secondary | ICD-10-CM

## 2023-11-18 NOTE — Patient Instructions (Signed)
 [facility] [address] [place], [place] [address] Phone: [phone] Fax: [phone] Forms: [email or web address]

## 2023-11-18 NOTE — Progress Notes (Unsigned)
 Dear Dr. Alvia, Here is my assessment for our mutual Jack [name], Jack Jack [name]. Thank you for allowing me Jack opportunity to care for your Jack [name]. Please do not hesitate to contact me should you have any other questions. Sincerely, Jack Cohen PA-C  Otolaryngology Clinic Note Referring provider: Dr. Alvia HPI:  Jack Jack [name] is Jack 53 y.o. Jack [name] kindly referred by Dr. Alvia   Jack Jack [name] is Jack Jack [name] seen in our office for evaluation of dizziness.  Jack Jack [name] notes several month history of ongoing tinnitus.  He notes he had this several years ago, it completely resolved on its own overnight.  He notes Jack couple months ago it returned, he denied any preceding trauma, no infections, no significant changes to his hearing.  He notes that his hearing is normal.  He reports Jack tinnitus is tonal in nature.  No pulsatile features.    Independent Review of Additional Tests or Records:  Audiological evaluation on 11/18/2023  Otoscopy: Right ear: Clear external ear canal and notable landmarks visualized on Jack tympanic membrane. Left ear:  Clear external ear canal and notable landmarks visualized on Jack tympanic membrane.   Tympanometry: Right ear: Type Jack- Normal external ear canal volume with normal middle ear pressure and tympanic membrane compliance. Left ear: Type Jack- Normal external ear canal volume with normal middle ear pressure and tympanic membrane compliance.     Pure tone Audiometry: Right ear- Normal to moderate sensorineural hearing loss from 250 Hz - 8000 Hz. Left ear-  Normal to moderate sensorineural hearing loss from 250 Hz - 8000 Hz.   Speech Audiometry: Right ear- Speech Reception Threshold (SRT) was obtained at 25 dBHL. Left ear-Speech Reception Threshold (SRT) was obtained at 20 dBHL.   Word Recognition Score Tested using NU-6 (recorded) Right ear: 100% was obtained at Jack presentation level of 60 dBHL with contralateral masking which is deemed as   excellent. Left ear: 96% was obtained at Jack presentation level of 60 dBHL with contralateral masking which is deemed as  excellent.   Jack hearing test results were completed under headphones and results are deemed to be of good reliability. Test technique:  conventional     Impression: There is not Jack significant difference in pure-tone thresholds between ears. There is not Jack significant difference in Jack word recognition score in between ears.   PMH/Meds/All/SocHx/FamHx/ROS:   Past Medical History:  Diagnosis Date   Anxiety    Gout    High cholesterol    Hypertension    Stroke [facility])      Past Surgical History:  Procedure Laterality Date   NO PAST SURGERIES      Family History  Adopted: Yes  Problem Relation Age of Onset   Stroke Neg Hx      Social Connections: Not on file      Current Outpatient Medications:    allopurinol  (ZYLOPRIM ) 300 MG tablet, TAKE 1 TABLET BY MOUTH EVERY DAY, Disp: 90 tablet, Rfl: 0   aspirin  325 MG tablet, Take 1 tablet (325 mg total) by mouth daily., Disp: , Rfl:    atorvastatin  (LIPITOR ) 40 MG tablet, TAKE 2 TABLETS (80 MG TOTAL) BY MOUTH AT BEDTIME., Disp: 180 tablet, Rfl: 1   buPROPion  (WELLBUTRIN  XL) 150 MG 24 hr tablet, TAKE 1 TABLET BY MOUTH EVERY DAY, Disp: 90 tablet, Rfl: 0   clonazePAM  (KLONOPIN ) 1 MG tablet, TAKE 1-1.5 TABLETS (1-1.5 MG TOTAL) BY MOUTH 2 (TWO) TIMES DAILY. MUST LAST 30 DAYS, Disp: 70 tablet, Rfl: 3  metoprolol  (TOPROL -XL) 200 MG 24 hr tablet, TAKE 1 TABLET BY MOUTH EVERY DAY, Disp: 90 tablet, Rfl: 0   tirzepatide  (MOUNJARO ) 12.5 MG/0.5ML Pen, Inject 12.5 mg into Jack skin once Jack week., Disp: 6 mL, Rfl: 1   Tretinoin  (ALTRENO ) 0.05 % LOTN, Apply daily qhs, Disp: 45 g, Rfl: 1   Physical Exam:   BP 113/79 (BP Location: Right Arm, Jack [name]: Sitting, Cuff Size: Normal)   Pulse 76   SpO2 96%   Pertinent Findings  CN II-XII intact Bilateral EAC clear and TM intact with well pneumatized middle ear spaces Anterior  rhinoscopy: Septum midline ; bilateral inferior turbinates with no hypertrophy No lesions of oral cavity/oropharynx; dentition within normal limits No obvious neck masses/lymphadenopathy/thyromegaly No respiratory distress or stridor  Seprately Identifiable Procedures:  None  Impression & Plans:  Jack Jack [name] is Jack 53 y.o. Jack [name] with Jack following   Tinnitus-  Bilateral tonal tinnitus with no alarming features today.  No symptoms concerning for pulsatile tinnitus.  Counseled him on masking devices, also gave him referral to [facility] tinnitus clinic.  Return precautions given.  Bilateral sensorineural hearing loss-  Follow-up as needed if he has any changes to his hearing.   - f/u PRN   Thank you for allowing me Jack opportunity to care for your Jack [name]. Please do not hesitate to contact me should you have any other questions.  Sincerely, Jack Cohen PA-C [facility] Phone: [phone] Fax: [phone]  11/18/2023, 11:35 AM

## 2023-11-18 NOTE — Progress Notes (Signed)
  [address] [place], [place] [address] [phone]  Audiological Evaluation    Name: Jack [name]     DOB:   06-17-1970      MRN:   [id number]                                                                                     Service Date: 11/18/2023     Accompanied by: unaccompanied   Patient comes today after Reyes Cohen, PA-C sent a referral for a hearing evaluation due to concerns with tinnitus.   Symptoms Yes Details  Hearing loss  [x]  Reports difficulty hearing - constant  Tinnitus  [x]  Ringing in both ears for months  Ear pain/ infections/pressure  []    Balance problems  []    Noise exposure history  [x]  Used headphones in the 80's  Previous ear surgeries  []    Family history of hearing loss  []    Amplification  []    Other  []      Otoscopy: Right ear: Clear external ear canal and notable landmarks visualized on the tympanic membrane. Left ear:  Clear external ear canal and notable landmarks visualized on the tympanic membrane.  Tympanometry: Right ear: Type A- Normal external ear canal volume with normal middle ear pressure and tympanic membrane compliance. Left ear: Type A- Normal external ear canal volume with normal middle ear pressure and tympanic membrane compliance.   Pure tone Audiometry: Right ear- Normal to moderate sensorineural hearing loss from 250 Hz - 8000 Hz. Left ear-  Normal to moderate sensorineural hearing loss from 250 Hz - 8000 Hz.  Speech Audiometry: Right ear- Speech Reception Threshold (SRT) was obtained at 25 dBHL. Left ear-Speech Reception Threshold (SRT) was obtained at 20 dBHL.   Word Recognition Score Tested using NU-6 (recorded) Right ear: 100% was obtained at a presentation level of 60 dBHL with contralateral masking which is deemed as  excellent. Left ear: 96% was obtained at a presentation level of 60 dBHL with contralateral masking which is deemed as  excellent.   The hearing test results were completed  under headphones and results are deemed to be of good reliability. Test technique:  conventional    Impression: There is not a significant difference in pure-tone thresholds between ears. There is not a significant difference in the word recognition score in between ears.    Recommendations: Follow up with ENT as scheduled for today. Return for a hearing evaluation if concerns with hearing changes arise or per MD recommendation. Consider various tinnitus strategies, including the use of a sound generator, hearing aids, and/or tinnitus retraining therapy.    Jack [name] Jack [name], AUD

## 2023-12-02 ENCOUNTER — Other Ambulatory Visit: Payer: Self-pay | Admitting: Family Medicine

## 2023-12-03 ENCOUNTER — Other Ambulatory Visit: Payer: Self-pay | Admitting: Family Medicine

## 2023-12-03 DIAGNOSIS — K140 Glossitis: Secondary | ICD-10-CM | POA: Diagnosis not present

## 2023-12-03 NOTE — Telephone Encounter (Signed)
 A prior authorization is required for Altreno  0.05% lotion. Thanks in advance.

## 2023-12-06 ENCOUNTER — Other Ambulatory Visit (HOSPITAL_COMMUNITY): Payer: Self-pay

## 2023-12-06 ENCOUNTER — Telehealth: Payer: Self-pay

## 2023-12-06 NOTE — Telephone Encounter (Signed)
 Pharmacy Patient Advocate Encounter   Received notification from RX Request Messages that prior authorization for Altreno  0.05% lotion is required/requested.   Insurance verification completed.   The patient is insured through [facility] .   Per test claim: PA required; PA submitted to above mentioned insurance via Latent Key/confirmation #/EOC [id number] Status is pending

## 2023-12-08 ENCOUNTER — Encounter: Payer: Self-pay | Admitting: Family Medicine

## 2023-12-08 MED ORDER — TRETINOIN 0.05 % EX CREA: TOPICAL_CREAM | Freq: Every day | CUTANEOUS | 0 refills | Status: DC

## 2023-12-08 NOTE — Telephone Encounter (Signed)
 Pharmacy Patient Advocate Encounter  Received notification from [facility] that Prior Authorization for Altreno  0.05% lotion has been DENIED.  Full denial letter will be uploaded to the media tab. See denial reason below.   PA #/Case ID/Reference #: [id number]

## 2023-12-08 NOTE — Addendum Note (Signed)
 Addended by: Takasha Vetere E on: 12/08/2023 04:48 PM   Modules accepted: Orders

## 2023-12-08 NOTE — Telephone Encounter (Signed)
 Copied from CRM #[id number]. Topic: Clinical - Prescription Issue >> Dec 08, 2023 12:01 PM Susanna ORN wrote: Reason for CRM: Jakena, with [employer], called stating that the Tretinoin  (ALTRENO ) 0.05 % LOTN was denied. Her call back number is [phone], option 3 & 1.

## 2023-12-09 ENCOUNTER — Other Ambulatory Visit: Payer: Self-pay | Admitting: Family Medicine

## 2023-12-09 MED ORDER — ALTRENO 0.05 % EX LOTN: TOPICAL_LOTION | CUTANEOUS | Status: DC

## 2023-12-30 ENCOUNTER — Other Ambulatory Visit: Payer: Self-pay | Admitting: Family Medicine

## 2023-12-30 DIAGNOSIS — F39 Unspecified mood [affective] disorder: Secondary | ICD-10-CM

## 2023-12-30 DIAGNOSIS — I10 Essential (primary) hypertension: Secondary | ICD-10-CM

## 2023-12-30 DIAGNOSIS — M1A9XX0 Chronic gout, unspecified, without tophus (tophi): Secondary | ICD-10-CM

## 2024-02-18 ENCOUNTER — Other Ambulatory Visit: Payer: Self-pay | Admitting: Family Medicine

## 2024-02-22 ENCOUNTER — Other Ambulatory Visit: Payer: Self-pay | Admitting: Family Medicine

## 2024-02-22 DIAGNOSIS — E1169 Type 2 diabetes mellitus with other specified complication: Secondary | ICD-10-CM

## 2024-02-24 ENCOUNTER — Other Ambulatory Visit: Payer: Self-pay | Admitting: Family Medicine

## 2024-02-24 DIAGNOSIS — F4001 Agoraphobia with panic disorder: Secondary | ICD-10-CM

## 2024-03-23 ENCOUNTER — Ambulatory Visit (INDEPENDENT_AMBULATORY_CARE_PROVIDER_SITE_OTHER): Admitting: Family Medicine

## 2024-03-23 ENCOUNTER — Encounter: Payer: Self-pay | Admitting: Family Medicine

## 2024-03-23 VITALS — BP 108/73 | HR 75 | Ht 72.0 in | Wt 178.0 lb

## 2024-03-23 DIAGNOSIS — E785 Hyperlipidemia, unspecified: Secondary | ICD-10-CM

## 2024-03-23 DIAGNOSIS — Z1211 Encounter for screening for malignant neoplasm of colon: Secondary | ICD-10-CM

## 2024-03-23 DIAGNOSIS — Z7985 Long-term (current) use of injectable non-insulin antidiabetic drugs: Secondary | ICD-10-CM | POA: Diagnosis not present

## 2024-03-23 DIAGNOSIS — F39 Unspecified mood [affective] disorder: Secondary | ICD-10-CM | POA: Diagnosis not present

## 2024-03-23 DIAGNOSIS — E1169 Type 2 diabetes mellitus with other specified complication: Secondary | ICD-10-CM | POA: Diagnosis not present

## 2024-03-23 DIAGNOSIS — I10 Essential (primary) hypertension: Secondary | ICD-10-CM | POA: Diagnosis not present

## 2024-03-23 NOTE — Assessment & Plan Note (Signed)
 Blood sugars are well-controlled.  Continue Mounjaro  at current strength of 12.5 mg weekly.

## 2024-03-23 NOTE — Assessment & Plan Note (Signed)
 Tolerating atorvastatin well at current strength.  Recommend continuation.

## 2024-03-23 NOTE — Progress Notes (Signed)
 " [name] - 54 y.o. male MRN [id number]  Date of birth: [birth year]  Subjective Chief Complaint  Patient presents with   Diabetes   Hypertension    HPI Jack [name] is a 54 y.o. male here today for follow up visit.   He reports that he is doing well.   He remains on mounjaro  12.5mg  weekly and is toleraitng this pretty well.  Weight is stable at current strength.  Doing well with atorvastatin  for associated HLD.   Mood remains stable with bupropion  daily and clonazepam  as needed.  No side effects from combination of medications.   ROS:  A comprehensive ROS was completed and negative except as noted per HPI   Allergies[1]  Past Medical History:  Diagnosis Date   Anxiety    Gout    High cholesterol    Hypertension    Stroke [facility])     Past Surgical History:  Procedure Laterality Date   NO PAST SURGERIES      Social History   Socioeconomic History   Marital status: Married    Spouse name: Not on file   Number of children: 0   Years of education: Some college   Highest education level: Not on file  Occupational History   Occupation: [employer]  Tobacco Use   Smoking status: Never   Smokeless tobacco: Never  Vaping Use   Vaping status: Never Used  Substance and Sexual Activity   Alcohol use: No   Drug use: No   Sexual activity: Yes  Other Topics Concern   Not on file  Social History Narrative   Lives w/ his partner   Right-handed   Caffeine: none   Social Drivers of Health   Tobacco Use: Low Risk (03/23/2024)   Patient History    Smoking Tobacco Use: Never    Smokeless Tobacco Use: Never    Passive Exposure: Not on file  Financial Resource Strain: Low Risk (03/23/2024)   Overall Financial Resource Strain (CARDIA)    Difficulty of Paying Living Expenses: Not hard at all  Food Insecurity: No Food Insecurity (03/23/2024)   Epic    Worried About Radiation Protection Practitioner of Food in the Last Year: Never true    Ran Out of Food in the Last Year: Never true   Transportation Needs: No Transportation Needs (03/23/2024)   Epic    Lack of Transportation (Medical): No    Lack of Transportation (Non-Medical): No  Physical Activity: Sufficiently Active (03/23/2024)   Exercise Vital Sign    Days of Exercise per Week: 5 days    Minutes of Exercise per Session: 30 min  Stress: No Stress Concern Present (03/23/2024)   [employer] of Occupational Health - Occupational Stress Questionnaire    Feeling of Stress: Not at all  Social Connections: Moderately Integrated (03/23/2024)   Social Connection and Isolation Panel    Frequency of Communication with Friends and Family: Three times a week    Frequency of Social Gatherings with Friends and Family: Twice a week    Attends Religious Services: Never    Database Administrator or Organizations: Yes    Attends Banker Meetings: 1 to 4 times per year    Marital Status: Married  Depression (PHQ2-9): Low Risk (03/23/2024)   Depression (PHQ2-9)    PHQ-2 Score: 0  Alcohol Screen: Low Risk (03/23/2024)   Alcohol Screen    Last Alcohol Screening Score (AUDIT): 1  Housing: Low Risk (03/23/2024)   Epic  Unable to Pay for Housing in the Last Year: No    Number of Times Moved in the Last Year: 0    Homeless in the Last Year: No  Utilities: Not At Risk (03/23/2024)   Epic    Threatened with loss of utilities: No  Health Literacy: Adequate Health Literacy (03/23/2024)   B1300 Health Literacy    Frequency of need for help with medical instructions: Never    Family History  Adopted: Yes  Problem Relation Age of Onset   Stroke Neg Hx     Health Maintenance  Topic Date Due   OPHTHALMOLOGY EXAM  09/01/2018   Diabetic kidney evaluation - Urine ACR  12/03/2023   Fecal DNA (Cologuard)  02/21/2024   Diabetic kidney evaluation - eGFR measurement  03/31/2024   Influenza Vaccine  06/13/2024 (Originally 10/15/2023)   Zoster Vaccines- Shingrix (1 of 2) 06/21/2024 (Originally 06/26/1988)   COVID-19 Vaccine (5 -  2025-26 season) 11/13/2024 (Originally 11/15/2023)   Pneumococcal Vaccine: 50+ Years (1 of 2 - PCV) 03/23/2025 (Originally 06/26/1988)   Hepatitis B Vaccines 19-59 Average Risk (1 of 3 - 19+ 3-dose series) 03/23/2025 (Originally 06/26/1988)   HEMOGLOBIN A1C  03/24/2024   FOOT EXAM  03/31/2024   DTaP/Tdap/Td (2 - Td or Tdap) 11/04/2026   Hepatitis C Screening  Completed   HIV Screening  Completed   HPV VACCINES  Aged Out   Meningococcal B Vaccine  Aged Out     ----------------------------------------------------------------------------------------------------------------------------------------------------------------------------------------------------------------- Physical Exam BP 108/74 (BP Location: Left Arm, Patient Position: Sitting, Cuff Size: Normal)   Pulse 75   Ht 6' (1.829 m)   Wt 178 lb (80.7 kg)   SpO2 97%   BMI 24.14 kg/m   Physical Exam Constitutional:      Appearance: Normal appearance.  HENT:     Head: Normocephalic and atraumatic.  Eyes:     General: No scleral icterus. Cardiovascular:     Rate and Rhythm: Normal rate and regular rhythm.  Pulmonary:     Effort: Pulmonary effort is normal.     Breath sounds: Normal breath sounds.  Neurological:     Mental Status: He is alert.  Psychiatric:        Mood and Affect: Mood normal.        Behavior: Behavior normal.     ------------------------------------------------------------------------------------------------------------------------------------------------------------------------------------------------------------------- Assessment and Plan  Hypertension goal BP (blood pressure) < 130/80 Blood pressure well-controlled.  Continue metoprolol  at current strength.   Type 2 diabetes mellitus with other specified complication (HCC) Blood sugars are well-controlled.  Continue Mounjaro  at current strength of 12.5 mg weekly.  Hyperlipidemia LDL goal <70 Tolerating atorvastatin  well at current strength.   Recommend continuation.  Mood disorder Stable with current medications.  We will plan to continue.   No orders of the defined types were placed in this encounter.   No follow-ups on file.        [1]  Allergies Allergen Reactions   Levaquin [Levofloxacin In D5w] Itching and Swelling   Edarbi [Azilsartan] Other (See Comments)    Depressed mood   Lisinopril Cough   Trazodone And Nefazodone Rash   Valsartan Rash   "

## 2024-03-23 NOTE — Assessment & Plan Note (Signed)
Stable with current medications.  We will plan to continue. 

## 2024-03-23 NOTE — Assessment & Plan Note (Signed)
 Blood pressure well-controlled.  Continue metoprolol  at current strength.

## 2024-03-24 LAB — CMP14+EGFR
ALT: 31 IU/L (ref 0–44)
AST: 29 IU/L (ref 0–40)
Albumin: 3.9 g/dL (ref 3.8–4.9)
Alkaline Phosphatase: 59 IU/L (ref 47–123)
BUN/Creatinine Ratio: 14 (ref 9–20)
BUN: 12 mg/dL (ref 6–24)
Bilirubin Total: 0.3 mg/dL (ref 0.0–1.2)
CO2: 25 mmol/L (ref 20–29)
Calcium: 8.4 mg/dL — ABNORMAL LOW (ref 8.7–10.2)
Chloride: 104 mmol/L (ref 96–106)
Creatinine, Ser: 0.86 mg/dL (ref 0.76–1.27)
Globulin, Total: 2.2 g/dL (ref 1.5–4.5)
Glucose: 85 mg/dL (ref 70–99)
Potassium: 4.3 mmol/L (ref 3.5–5.2)
Sodium: 141 mmol/L (ref 134–144)
Total Protein: 6.1 g/dL (ref 6.0–8.5)
eGFR: 103 mL/min/1.73

## 2024-03-24 LAB — CBC WITH DIFFERENTIAL/PLATELET
Basophils Absolute: 0 x10E3/uL (ref 0.0–0.2)
Basos: 0 %
EOS (ABSOLUTE): 0.3 x10E3/uL (ref 0.0–0.4)
Eos: 4 %
Hematocrit: 44.7 % (ref 37.5–51.0)
Hemoglobin: 14.8 g/dL (ref 13.0–17.7)
Immature Grans (Abs): 0 x10E3/uL (ref 0.0–0.1)
Immature Granulocytes: 0 %
Lymphocytes Absolute: 2.5 x10E3/uL (ref 0.7–3.1)
Lymphs: 34 %
MCH: 31.4 pg (ref 26.6–33.0)
MCHC: 33.1 g/dL (ref 31.5–35.7)
MCV: 95 fL (ref 79–97)
Monocytes Absolute: 0.7 x10E3/uL (ref 0.1–0.9)
Monocytes: 9 %
Neutrophils Absolute: 3.8 x10E3/uL (ref 1.4–7.0)
Neutrophils: 53 %
Platelets: 190 x10E3/uL (ref 150–450)
RBC: 4.72 x10E6/uL (ref 4.14–5.80)
RDW: 12.1 % (ref 11.6–15.4)
WBC: 7.3 x10E3/uL (ref 3.4–10.8)

## 2024-03-24 LAB — LIPID PANEL WITH LDL/HDL RATIO
Cholesterol, Total: 147 mg/dL (ref 100–199)
HDL: 40 mg/dL
LDL Chol Calc (NIH): 87 mg/dL (ref 0–99)
LDL/HDL Ratio: 2.2 ratio (ref 0.0–3.6)
Triglycerides: 112 mg/dL (ref 0–149)
VLDL Cholesterol Cal: 20 mg/dL (ref 5–40)

## 2024-03-24 LAB — HEMOGLOBIN A1C
Est. average glucose Bld gHb Est-mCnc: 105 mg/dL
Hgb A1c MFr Bld: 5.3 % (ref 4.8–5.6)

## 2024-03-25 ENCOUNTER — Other Ambulatory Visit: Payer: Self-pay | Admitting: Family Medicine

## 2024-03-25 DIAGNOSIS — I2510 Atherosclerotic heart disease of native coronary artery without angina pectoris: Secondary | ICD-10-CM

## 2024-03-25 DIAGNOSIS — E785 Hyperlipidemia, unspecified: Secondary | ICD-10-CM

## 2024-04-02 ENCOUNTER — Ambulatory Visit: Payer: Self-pay | Admitting: Family Medicine

## 2024-09-21 ENCOUNTER — Ambulatory Visit: Admitting: Family Medicine
